# Patient Record
Sex: Female | Born: 1985 | Race: Black or African American | Hispanic: No | Marital: Single | Smoking: Former smoker
Health system: Southern US, Community
[De-identification: ages and names within clinical notes are randomized; demographics above are authoritative.]

## PROBLEM LIST (undated history)

## (undated) ENCOUNTER — Inpatient Hospital Stay (HOSPITAL_COMMUNITY): Payer: Self-pay

## (undated) ENCOUNTER — Emergency Department (HOSPITAL_COMMUNITY): Discharge status: Absent without leave | Payer: Managed Care, Other (non HMO)

## (undated) DIAGNOSIS — R51 Headache: Secondary | ICD-10-CM

## (undated) DIAGNOSIS — I10 Essential (primary) hypertension: Secondary | ICD-10-CM

## (undated) DIAGNOSIS — J45909 Unspecified asthma, uncomplicated: Secondary | ICD-10-CM

## (undated) DIAGNOSIS — R87619 Unspecified abnormal cytological findings in specimens from cervix uteri: Secondary | ICD-10-CM

## (undated) DIAGNOSIS — F4310 Post-traumatic stress disorder, unspecified: Secondary | ICD-10-CM

## (undated) DIAGNOSIS — IMO0002 Reserved for concepts with insufficient information to code with codable children: Secondary | ICD-10-CM

## (undated) DIAGNOSIS — F32A Depression, unspecified: Secondary | ICD-10-CM

## (undated) DIAGNOSIS — M545 Low back pain, unspecified: Secondary | ICD-10-CM

## (undated) DIAGNOSIS — M79606 Pain in leg, unspecified: Secondary | ICD-10-CM

## (undated) DIAGNOSIS — N83209 Unspecified ovarian cyst, unspecified side: Secondary | ICD-10-CM

## (undated) DIAGNOSIS — A749 Chlamydial infection, unspecified: Secondary | ICD-10-CM

## (undated) DIAGNOSIS — D649 Anemia, unspecified: Secondary | ICD-10-CM

## (undated) HISTORY — DX: Low back pain, unspecified: M54.50

## (undated) HISTORY — DX: Depression, unspecified: F32.A

## (undated) HISTORY — DX: Low back pain: M54.5

## (undated) HISTORY — PX: TUBAL LIGATION: SHX77

## (undated) HISTORY — DX: Pain in leg, unspecified: M79.606

## (undated) HISTORY — DX: Post-traumatic stress disorder, unspecified: F43.10

---
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
## 2004-08-29 ENCOUNTER — Inpatient Hospital Stay (HOSPITAL_COMMUNITY): Admission: AD | Admit: 2004-08-29 | Discharge: 2004-08-29 | Payer: Self-pay | Admitting: *Deleted

## 2004-09-10 ENCOUNTER — Other Ambulatory Visit: Admission: RE | Admit: 2004-09-10 | Discharge: 2004-09-10 | Payer: Self-pay | Admitting: Obstetrics and Gynecology

## 2005-01-23 ENCOUNTER — Inpatient Hospital Stay (HOSPITAL_COMMUNITY): Admission: AD | Admit: 2005-01-23 | Discharge: 2005-01-25 | Payer: Self-pay | Admitting: Obstetrics and Gynecology

## 2005-02-07 ENCOUNTER — Inpatient Hospital Stay (HOSPITAL_COMMUNITY): Admission: AD | Admit: 2005-02-07 | Discharge: 2005-02-07 | Payer: Self-pay | Admitting: Obstetrics and Gynecology

## 2005-02-21 HISTORY — PX: OTHER SURGICAL HISTORY: SHX169

## 2005-02-21 HISTORY — PX: DIAGNOSTIC LAPAROSCOPY: SUR761

## 2005-04-06 ENCOUNTER — Inpatient Hospital Stay (HOSPITAL_COMMUNITY): Admission: AD | Admit: 2005-04-06 | Discharge: 2005-04-10 | Payer: Self-pay | Admitting: Obstetrics and Gynecology

## 2005-04-07 ENCOUNTER — Encounter (INDEPENDENT_AMBULATORY_CARE_PROVIDER_SITE_OTHER): Payer: Self-pay | Admitting: Specialist

## 2005-05-20 ENCOUNTER — Other Ambulatory Visit: Admission: RE | Admit: 2005-05-20 | Discharge: 2005-05-20 | Payer: Self-pay | Admitting: Obstetrics and Gynecology

## 2005-07-01 ENCOUNTER — Observation Stay (HOSPITAL_COMMUNITY): Admission: AD | Admit: 2005-07-01 | Discharge: 2005-07-02 | Payer: Self-pay | Admitting: Obstetrics and Gynecology

## 2005-07-01 ENCOUNTER — Encounter (INDEPENDENT_AMBULATORY_CARE_PROVIDER_SITE_OTHER): Payer: Self-pay | Admitting: Specialist

## 2006-05-31 ENCOUNTER — Other Ambulatory Visit: Admission: RE | Admit: 2006-05-31 | Discharge: 2006-05-31 | Payer: Self-pay | Admitting: Obstetrics and Gynecology

## 2006-08-18 ENCOUNTER — Emergency Department (HOSPITAL_COMMUNITY): Admission: EM | Admit: 2006-08-18 | Discharge: 2006-08-18 | Payer: Self-pay | Admitting: Emergency Medicine

## 2006-08-19 ENCOUNTER — Emergency Department (HOSPITAL_COMMUNITY): Admission: EM | Admit: 2006-08-19 | Discharge: 2006-08-19 | Payer: Self-pay | Admitting: Emergency Medicine

## 2007-02-17 ENCOUNTER — Emergency Department (HOSPITAL_COMMUNITY): Admission: EM | Admit: 2007-02-17 | Discharge: 2007-02-17 | Payer: Self-pay | Admitting: Family Medicine

## 2007-05-21 ENCOUNTER — Emergency Department (HOSPITAL_COMMUNITY): Admission: EM | Admit: 2007-05-21 | Discharge: 2007-05-22 | Payer: Self-pay | Admitting: Emergency Medicine

## 2007-05-26 ENCOUNTER — Inpatient Hospital Stay (HOSPITAL_COMMUNITY): Admission: AD | Admit: 2007-05-26 | Discharge: 2007-05-26 | Payer: Self-pay | Admitting: Obstetrics and Gynecology

## 2007-06-10 ENCOUNTER — Inpatient Hospital Stay (HOSPITAL_COMMUNITY): Admission: AD | Admit: 2007-06-10 | Discharge: 2007-06-10 | Payer: Self-pay | Admitting: Obstetrics and Gynecology

## 2007-06-17 ENCOUNTER — Inpatient Hospital Stay (HOSPITAL_COMMUNITY): Admission: AD | Admit: 2007-06-17 | Discharge: 2007-06-17 | Payer: Self-pay | Admitting: Obstetrics and Gynecology

## 2007-07-20 ENCOUNTER — Emergency Department (HOSPITAL_COMMUNITY): Admission: EM | Admit: 2007-07-20 | Discharge: 2007-07-21 | Payer: Self-pay | Admitting: Emergency Medicine

## 2007-10-16 ENCOUNTER — Inpatient Hospital Stay (HOSPITAL_COMMUNITY): Admission: AD | Admit: 2007-10-16 | Discharge: 2007-10-16 | Payer: Self-pay | Admitting: Obstetrics and Gynecology

## 2008-01-24 ENCOUNTER — Inpatient Hospital Stay (HOSPITAL_COMMUNITY): Admission: AD | Admit: 2008-01-24 | Discharge: 2008-01-27 | Payer: Self-pay | Admitting: Obstetrics and Gynecology

## 2008-01-24 ENCOUNTER — Encounter (INDEPENDENT_AMBULATORY_CARE_PROVIDER_SITE_OTHER): Payer: Self-pay | Admitting: Obstetrics and Gynecology

## 2008-05-09 ENCOUNTER — Emergency Department (HOSPITAL_COMMUNITY): Admission: EM | Admit: 2008-05-09 | Discharge: 2008-05-09 | Payer: Self-pay | Admitting: Family Medicine

## 2008-07-23 ENCOUNTER — Emergency Department (HOSPITAL_COMMUNITY): Admission: EM | Admit: 2008-07-23 | Discharge: 2008-07-23 | Payer: Self-pay | Admitting: Emergency Medicine

## 2008-08-29 ENCOUNTER — Emergency Department (HOSPITAL_COMMUNITY): Admission: EM | Admit: 2008-08-29 | Discharge: 2008-08-29 | Payer: Self-pay | Admitting: Emergency Medicine

## 2008-11-16 ENCOUNTER — Emergency Department (HOSPITAL_COMMUNITY): Admission: EM | Admit: 2008-11-16 | Discharge: 2008-11-16 | Payer: Self-pay | Admitting: Emergency Medicine

## 2009-10-12 ENCOUNTER — Inpatient Hospital Stay (HOSPITAL_COMMUNITY): Admission: AD | Admit: 2009-10-12 | Discharge: 2009-10-12 | Payer: Self-pay | Admitting: Obstetrics & Gynecology

## 2009-10-12 ENCOUNTER — Ambulatory Visit: Payer: Self-pay | Admitting: Nurse Practitioner

## 2010-01-19 ENCOUNTER — Emergency Department (HOSPITAL_COMMUNITY)
Admission: EM | Admit: 2010-01-19 | Discharge: 2010-01-19 | Payer: Self-pay | Source: Home / Self Care | Admitting: Emergency Medicine

## 2010-02-02 ENCOUNTER — Emergency Department (HOSPITAL_COMMUNITY)
Admission: EM | Admit: 2010-02-02 | Discharge: 2010-02-02 | Payer: Self-pay | Source: Home / Self Care | Admitting: Emergency Medicine

## 2010-04-26 ENCOUNTER — Inpatient Hospital Stay (HOSPITAL_COMMUNITY)
Admission: AD | Admit: 2010-04-26 | Discharge: 2010-04-26 | Disposition: A | Discharge status: Home / Self Care | Payer: Self-pay | Source: Ambulatory Visit | Attending: Obstetrics & Gynecology | Admitting: Obstetrics & Gynecology

## 2010-04-26 DIAGNOSIS — N760 Acute vaginitis: Secondary | ICD-10-CM

## 2010-04-26 DIAGNOSIS — R109 Unspecified abdominal pain: Secondary | ICD-10-CM | POA: Insufficient documentation

## 2010-04-26 DIAGNOSIS — B9689 Other specified bacterial agents as the cause of diseases classified elsewhere: Secondary | ICD-10-CM | POA: Insufficient documentation

## 2010-04-26 DIAGNOSIS — A499 Bacterial infection, unspecified: Secondary | ICD-10-CM

## 2010-04-26 LAB — POCT PREGNANCY, URINE: Preg Test, Ur: NEGATIVE

## 2010-04-26 LAB — URINALYSIS, ROUTINE W REFLEX MICROSCOPIC
Bilirubin Urine: NEGATIVE
Leukocytes, UA: NEGATIVE
Nitrite: NEGATIVE
Specific Gravity, Urine: 1.025 (ref 1.005–1.030)
pH: 6.5 (ref 5.0–8.0)

## 2010-04-26 LAB — CBC
HCT: 36.9 % (ref 36.0–46.0)
MCH: 26.2 pg (ref 26.0–34.0)
MCHC: 32 g/dL (ref 30.0–36.0)
MCV: 82 fL (ref 78.0–100.0)
RDW: 14.5 % (ref 11.5–15.5)

## 2010-04-26 LAB — WET PREP, GENITAL: Yeast Wet Prep HPF POC: NONE SEEN

## 2010-04-26 LAB — URINE MICROSCOPIC-ADD ON

## 2010-05-04 LAB — WET PREP, GENITAL
Clue Cells Wet Prep HPF POC: NONE SEEN
Trich, Wet Prep: NONE SEEN

## 2010-05-04 LAB — URINE MICROSCOPIC-ADD ON

## 2010-05-04 LAB — RPR: RPR Ser Ql: NONREACTIVE

## 2010-05-04 LAB — URINALYSIS, ROUTINE W REFLEX MICROSCOPIC
Bilirubin Urine: NEGATIVE
Glucose, UA: NEGATIVE mg/dL
Glucose, UA: NEGATIVE mg/dL
Ketones, ur: NEGATIVE mg/dL
Protein, ur: 100 mg/dL — AB
pH: 6 (ref 5.0–8.0)
pH: 6.5 (ref 5.0–8.0)

## 2010-05-04 LAB — URINE CULTURE: Culture  Setup Time: 201112131116

## 2010-05-06 LAB — POCT PREGNANCY, URINE: Preg Test, Ur: NEGATIVE

## 2010-05-06 LAB — URINALYSIS, ROUTINE W REFLEX MICROSCOPIC
Bilirubin Urine: NEGATIVE
Ketones, ur: NEGATIVE mg/dL
Protein, ur: NEGATIVE mg/dL
Urobilinogen, UA: 0.2 mg/dL (ref 0.0–1.0)

## 2010-05-06 LAB — WET PREP, GENITAL
Trich, Wet Prep: NONE SEEN
Yeast Wet Prep HPF POC: NONE SEEN

## 2010-05-06 LAB — URINE MICROSCOPIC-ADD ON

## 2010-05-27 ENCOUNTER — Encounter: Payer: Self-pay | Admitting: Obstetrics & Gynecology

## 2010-05-31 LAB — RPR: RPR Ser Ql: NONREACTIVE

## 2010-05-31 LAB — URINE MICROSCOPIC-ADD ON

## 2010-05-31 LAB — URINALYSIS, ROUTINE W REFLEX MICROSCOPIC
Bilirubin Urine: NEGATIVE
Nitrite: NEGATIVE
Specific Gravity, Urine: 1.027 (ref 1.005–1.030)
pH: 6 (ref 5.0–8.0)

## 2010-05-31 LAB — PREGNANCY, URINE: Preg Test, Ur: NEGATIVE

## 2010-05-31 LAB — GC/CHLAMYDIA PROBE AMP, GENITAL
Chlamydia, DNA Probe: NEGATIVE
GC Probe Amp, Genital: NEGATIVE

## 2010-05-31 LAB — WET PREP, GENITAL: Yeast Wet Prep HPF POC: NONE SEEN

## 2010-07-06 NOTE — Op Note (Signed)
[name], [name]             ACCOUNT NO.:  [id number]   MEDICAL RECORD NO.:  [id number]          PATIENT TYPE:  INP   LOCATION:  9133                          [facility]   PHYSICIAN:  Hal Morales, M.D.DATE OF BIRTH:  1986/01/30   DATE OF PROCEDURE:  01/24/2008  DATE OF DISCHARGE:                               OPERATIVE REPORT   PREOPERATIVE DIAGNOSES:  1. Intrauterine pregnancy at 94 weeks' gestation.  2. Prior cesarean section.  3. Failed trial of labor.  4. Nonreassuring fetal heart rate tracing.   POSTOPERATIVE DIAGNOSES:  1. Intrauterine pregnancy at 25 weeks' gestation.  2. Prior cesarean section.  3. Failed trial of labor.  4. Nonreassuring fetal heart rate tracing.   OPERATION:  Repeat low transverse cesarean section.   SURGEON:  Hal Morales, MD   FIRST ASSISTANT:  Elby Showers. Mayford Knife, CNM   ANESTHESIA:  Epidural.   ESTIMATED BLOOD LOSS:  750 mL.   COMPLICATIONS:  None.   FINDINGS:  The patient delivered a female infant weighing 7 pounds 10  ounces with Apgars of 5 and 9 at 1 and 5 minutes respectively.  The cord  pH was 7.3.  The placenta contained an eccentrically inserted 3-vessel  cord.  The amniotic fluid was port wine colored upon entry into the  uterus.   PROCEDURE:  The patient was taken to the operating room after  appropriate identification and after a discussion of the indications for  her recommended procedure, and the risks of anesthesia, bleeding,  infection, and damage to adjacent organs.  Her labor epidural and Foley  catheter were in place with blood tinging of the urine.  The labor  epidural was dosed for surgical anesthesia, and the patient was placed  on the operating table in the supine position with a left lateral tilt.  The abdomen was prepped as a sterile field with multiple layers and  draped as a sterile field.  The site of the previous cesarean section  incision was infiltrated with 30 mL of 0.25% Marcaine.  After  assurance  of adequate surgical anesthesia, a suprapubic incision was made at this  site, and the abdomen opened in layers.  The peritoneum was entered and  the bladder blade placed.  The uterus was incised approximately 2 cm  above the uterovesical fold, and that incision taken laterally on either  side.  The infant was delivered from the occiput transverse position and  after having the nares and pharynx suctioned and the cord clamped and  cut, was handed off to the awaiting pediatricians.  The placenta was  allowed to spontaneously separate from the uterus and was removed from  the operative field.  It was given to the representative of [facility]  cord blood banking for cord blood donation.  The uterine incision was  closed with a running interlocking suture noting that the lower uterine  segment was adherent to the bladder and markedly thin.  This required  teasing the bladder off the anterior portion of the lower uterine  segment, which allowed closure of the first layer.  A second layer was  placed  from the midline to the right apex of the incision with adequate  hemostasis.  Copious irrigation was carried out.  The tubes and ovaries  were normal bilaterally.  The abdominal peritoneum was then closed in a  running fashion with 2-0 Vicryl.  The rectus fascia was closed with a  running suture of 0 Vicryl, then reinforced on either side of midline  with figure-of-8 suture of 0 Vicryl.  The subcutaneous tissue was made  hemostatic with Bovie cautery and irrigated.  A subcuticular suture of 3-  0 Monocryl was placed to approximate the skin incision.  Steri-Strips  were applied and a sterile dressing applied.  The patient was taken from  the operating room to the recovery room in satisfactory condition having  tolerated the procedure well with sponge and instrument counts correct.   SPECIMENS TO PATHOLOGY:  Placenta.   The infant went to the full-term nursery.      Hal Morales, M.D.  Electronically Signed     VPH/MEDQ  D:  01/24/2008  T:  01/25/2008  Job:  [id number]

## 2010-07-06 NOTE — Discharge Summary (Signed)
NAMEPAYDEN, [name]             ACCOUNT NO.:  [id number]   MEDICAL RECORD NO.:  [id number]          PATIENT TYPE:  INP   LOCATION:  9133                          [facility]   PHYSICIAN:  Crist Fat. Rivard, M.D. DATE OF BIRTH:  10-22-1985   DATE OF ADMISSION:  01/24/2008  DATE OF DISCHARGE:  01/27/2008                               DISCHARGE SUMMARY   ADMITTING PHYSICIAN:  Dois Davenport A. Rivard, MD   DISCHARGING PHYSICIAN:  Osborn Coho, MD   ADMISSION DIAGNOSES:  1. Intrauterine pregnancy at 36 and 2/7th weeks.  2. Early active labor.  3. Trial of labor with previous cesarean section.   DISCHARGE DIAGNOSES:  1. Intrauterine pregnancy at 50 and 2/7th weeks.  2. Early active labor..  3. Trial of labor with previous cesarean section.  4. Failed trial of labor and nonreassuring fetal heart rate tracing.  5. Status post cesarean delivery of a female infant weighing 7 pounds      10 ounces with Apgars of 5 and 9.   HOSPITAL PROCEDURES:  1. Electronic fetal monitoring.  2. Epidural anesthesia.  3. Amniotomy.  4. Internal fetal monitors.  5. Amnioinfusion.  6. Repeat low-transverse cesarean section.   HOSPITAL COURSE:  The patient was admitted in early active labor with  cervix of 4 cm.  She got an epidural for pain and progressed to 4-5 cm.  Low-dose Pitocin was started and amniotomy was performed.  She  progressed to 5 cm later in the morning and then 7 cm by the afternoon.  She had a prolonged fetal heart rate deceleration in the evening of  January 24, 2008 with usual measures employed for resolution.  She  progressed to 8-9 cm, but began to have swelling in her cervix.  About  an hour later after that, her cervix remained at 8 cm with swelling.  She had borderline contractions per IUPC, and fetal heart rate developed  recurrent variable decelerations whenever she had strong contractions,  and Pitocin was unable to be restarted.  C-section was recommended and  accepted,  and she had a delivery of a female infant weighing 7 pounds 10  ounces, Apgars 5 and 9.  Cord pH was 7.3, amniotic fluid was port-wine  colored upon entry into the uterus.  Postop day #1, she did well, did  not have any dizziness, urinary output was sufficient, and her  hemoglobin was 8.9.  She was started on iron.  On postop day #2, she  continued to improve receiving routine care, Motrin and Percocet were  controlling her pain well.  On postop day #3, she was ready to go home.  Chest was clear.  Heart rate regular rate and rhythm.  Abdomen was soft  and appropriately tender.  Incision was clean, dry and intact.  She was  tolerating food and fluids well and voiding sufficiently.  She had some  mild peripheral edema, but no other abnormalities.  Blood pressure was  normal at 112/64.  She was deemed to have receive full benefit of her  hospital stay and was discharged home.   DISCHARGE MEDICATIONS:  1. Motrin 600  mg p.o. q.6 h. p.r.n.  2. Percocet 1-2 p.o. q.4 h. p.r.n.   DISCHARGE LABORATORY DATA:  White blood cell count 20.8, hemoglobin 8.9,  and platelets 145.  RPR nonreactive.   DISCHARGE INSTRUCTIONS:  Per [facility] handout.  Discharge followup in 6 weeks  or p.r.n.      Marie L. Williams, C.N.M.      Crist Fat Rivard, M.D.  Electronically Signed    MLW/MEDQ  D:  01/27/2008  T:  01/27/2008  Job:  [id number]

## 2010-07-06 NOTE — H&P (Signed)
NAMEFONTELLA, [name]             ACCOUNT NO.:  [id number]   MEDICAL RECORD NO.:  [id number]          PATIENT TYPE:  INP   LOCATION:  9162                          [facility]   PHYSICIAN:  Crist Fat. Rivard, M.D. DATE OF BIRTH:  13-Jan-1986   DATE OF ADMISSION:  01/24/2008  DATE OF DISCHARGE:                              HISTORY & PHYSICAL   This is a 25 year old gravida 3, para 1-1-0-2 at 39-2/7th weeks, who  presents with contractions.  She denies leaking or bleeding or reports  positive fetal movement.  Pregnancy has been followed by the Nurse-  Midwife Service and remarkable for;  1. Previous C-section.  2. History of dermoid cyst.  3. First-trimester spotting.  4. Group B strep negative.   ALLERGIES:  None.   OB HISTORY:  Remarkable for vaginal delivery in 2005 of a female infant  at 85 weeks' gestation weighing 6 pounds 13 ounces.  She had a C-section  in 2007 of a female infant at 38 weeks' gestation weighing 7 pounds 1  ounce, remarkable for fetal distress.   MEDICAL HISTORY:  Remarkable for history of chlamydia in 2005, history  of dermoid cyst in 2007, and childhood varicella.   SURGICAL HISTORY:  Remarkable for a C-section in 2007.   FAMILY HISTORY:  Remarkable for grandparents with hypertension and  grandmother with GB.   GENETIC HISTORY:  Remarkable for an uncle with mental retardation.   SOCIAL HISTORY:  The patient is single.  Father of the baby, Thomas Hoff, is involved and supportive.  She does not report a religious  affiliation.  She denies any alcohol, tobacco, or drug use.   PRENATAL LABS:  Hemoglobin 12.4, platelets 256, blood type O positive,  antibody screen negative, sickle cell negative, RPR nonreactive, rubella  immune, hepatitis negative, HIV negative, Pap test normal, gonorrhea  negative, chlamydia negative, and cystic fibrosis negative.   HISTORY OF CURRENT PREGNANCY:  The patient entered care at 8 weeks'  gestation.  She had an  ultrasound at 9 weeks showing an anterior fibroid  at 2 cm.  First-trimester screen was normal.  Anatomy ultrasound was  normal.  She signed tubal ligation papers at 23 weeks.  She had group B  strep negative at term.   OBJECTIVE DATA:  VITAL SIGNS:  Stable, afebrile.  HEENT:  Within normal limits.  THYROID:  Normal, not enlarged.  CHEST:  Clear to auscultation.  HEART:  Regular rate and rhythm.  ABDOMEN:  Gravid.  Vertex Leopold.  EFM shows reactive fetal heart rate  with contractions every 5 minutes.  EFW is about 6-1/2 to 7 pounds.  Cervix is 4 cm, 80% effaced, -2 station with a vertex presentation.  EXTREMITIES:  Within normal limits.   ASSESSMENT:  1. Intrauterine pregnancy at 39-2/7th weeks.  2. Early active labor.  3. Trial of labor with previous cesarean section.   PLAN:  1. Admit per Dr. Estanislado Pandy.  2. Routine CNM orders.      Marie L. Williams, C.N.M.      Crist Fat Rivard, M.D.  Electronically Signed    MLW/MEDQ  D:  01/24/2008  T:  01/24/2008  Job:  [id number]

## 2010-07-09 NOTE — Discharge Summary (Signed)
[name], [name]            ACCOUNT NO.:  [id number]   MEDICAL RECORD NO.:  [id number]          PATIENT TYPE:  INP   LOCATION:  9158                          [facility]   PHYSICIAN:  James A. Ashley Royalty, M.D.DATE OF BIRTH:  April 21, 1985   DATE OF ADMISSION:  01/23/2005  DATE OF DISCHARGE:  01/25/2005                                 DISCHARGE SUMMARY   DISCHARGE DIAGNOSES:  1.  Intrauterine pregnancy at 26 weeks, undelivered.  2.  Preterm labor, lysed.   OPERATIONS AND SPECIAL PROCEDURES:  None.   DISCHARGE MEDICATIONS:  Vitamins.   HISTORY AND PHYSICAL:  A 25 year old gravida 2, para 1-0-0-1 admitted per  Dr. Richardson Dopp for preterm labor at or about [redacted] weeks gestation.  For the  remainder of the history and physical, please see chart.   HOSPITAL COURSE:  The patient was admitted to [facility].  Admission laboratory studies were drawn.  On January 24, 2005,  the patient was evaluated and found to have the contractions lysed.  Fetal  fibronectin was negative.  Hence, she was discontinued from magnesium  sulfate.  On January 25, 2005, she was felt to be a candidate for discharge,  and was discharged home afebrile and in satisfactory condition.   OBSTETRICAL CLINICAL FINDINGS:  GC/chlamydia culture was negative.  Group B  strep positive.   DISPOSITION:  The patient is to return to the office in approximately two  days for followup.      James A. Ashley Royalty, M.D.  Electronically Signed     JAM/MEDQ  D:  03/10/2005  T:  03/10/2005  Job:  [id number]

## 2010-07-09 NOTE — H&P (Signed)
[name], Yolanda NO.:  [id number]   MEDICAL RECORD NO.:  [id number]                   PATIENT TYPE:  MAT   LOCATION:  MATC                                 [facility]   PHYSICIAN:  Hal Morales, M.D.             DATE OF BIRTH:  04-Mar-1985   DATE OF ADMISSION:  10/14/2003  DATE OF DISCHARGE:                                HISTORY & PHYSICAL   [name] is an 25 year old gravida 1, para 0 at [redacted] weeks gestation, EDD  October 15, 2003, as determined by dates and confirmed with pregnancy  ultrasound.  This patient was evaluated at the office of [facility] this a.m. with  contractions every two minutes all night long which has become much more  intense.  She reports positive fetal movement, no bleeding, no rupture of  membranes.  Her cervix in the office was 4, 95%, vertex -1.  She was with a  bulging bag of water.  She was therefore admitted to [facility] in labor.  She denies any PIH symptoms, no headache, visual  changes or epigastric pain.  Her pregnancy has been followed by the C.N.M.  service at [facility] and is remarkable for:  1. Slightly late care.  She began care at [redacted] weeks gestation.  2. Mild cognitive impairment.  3. History of migraines.  4. Positive chlamydia culture in February of 2005.  5. Urinary tract infection in the first trimester.   Her group B strep culture is negative.  This patient began prenatal care on  April 16, 2003, at [redacted] weeks gestation.  EDC determined by dates and  confirmed with ultrasound.  At her first prenatal visit, it was found that  the patient had a urinary tract infection and positive chlamydia.  She was  treated for that on May 15, 2003, and given a prescription for [facility]  for seven days for the urinary tract infection.  Test of cure for [facility] and  chlamydia were negative on May 23, 2003.  Her urine culture was negative  following her treatment with Macrobid.  Otherwise her pregnancy  has been  unremarkable.  She has been size less than dates throughout her pregnancy,  normotensive with no proteinuria.  An ultrasound examination has remained  with appropriate growth.  She has had an EIF which has remained throughout  the pregnancy and is unchanged with her last ultrasound.   PRENATAL LABORATORY DATA:  On April 16, 2003, hemoglobin and hematocrit  11.1 and 14.5, platelets 310,000.  Blood type and Rh O positive.  Antibody  screen negative.  VDRL nonreactive.  Rubella immune.  Hepatitis B surface  antigen negative.  Sickle cell trait negative.  HIV declined.  Pap smear  within normal limits.  Positive chlamydia at her new OB visit.  Negative  chlamydia on May 23, 2003.  CF testing was negative.  Quad screen within  normal limits.  At 35  weeks one hour glucose challenge within normal limits.  At 36 weeks, culture of the vaginal tract is negative for GBS, GC and  chlamydia.   ALLERGIES:  No known drug allergies.   PAST MEDICAL HISTORY:  The patient had a colposcopy in 2002 for an abnormal  Pap smear and had chlamydia in 2003 and also in the first trimester of this  pregnancy.  The patient is a cigarette smoker and she smoked approximately  half a pack of cigarettes per day prior to pregnancy.  Since then, the  patient states that she stopped smoking with positive urine pregnancy test.  She does have a history of migraines for which she uses ibuprofen  effectively.   FAMILY HISTORY:  The patient's mother, father, maternal grandmother,  maternal grandfather paternal grandmother and paternal grandfather with a  history of chronic hypertension.  Paternal aunt and cousins with diabetes.  Patient's mother, father, maternal grandfather and paternal grandmother all  also have migraine headaches.  Her maternal grandmother had cancer of an  unknown type and had a hysterectomy.  The patient's mother is a tobacco  smoker and mother and father use alcohol and tobacco.    GENETIC HISTORY:  There is no genetic history of familial chromosomal  disorders, children that died in infancy or that were born with birth  defects.   SOCIAL HISTORY:  [name] is an 25 year old African American female.  Father of the baby is not involved with her pregnancy at all.  She does not  subscribe to religious faith.  She denies the use of alcohol or illicit  drugs.   REVIEW OF SYMPTOMS:  There are no signs or symptoms suggestive of focal or  systemic disease and the patient is typical of one with a uterine pregnancy  at term in active labor.   PLAN:  Admit per Dr. Hal Morales.  Routine C.N.M. orders.  The patient  may have epidural as desired.  Discussed plan with the patient as well as  expectant management for labor at this time.  She may have an epidural for  pain control.     [name], C.N.M.               Hal Morales, M.D.    SDM/MEDQ  D:  10/15/2003  T:  10/15/2003  Job:  [id number]

## 2010-07-09 NOTE — Discharge Summary (Signed)
[name], [name]            ACCOUNT NO.:  [id number]   MEDICAL RECORD NO.:  [id number]          PATIENT TYPE:  OBV   LOCATION:  9302                          [facility]   PHYSICIAN:  James A. Ashley Royalty, M.D.DATE OF BIRTH:  03-18-1985   DATE OF ADMISSION:  07/01/2005  DATE OF DISCHARGE:  07/02/2005                                 DISCHARGE SUMMARY   DISCHARGE DIAGNOSIS:  Right ovarian cystic teratoma - pending.   OPERATIONS AND SPECIAL PROCEDURES:  Diagnostic/operative laparoscopy with  right oophorectomy.   CONSULTATIONS:  None.   DISCHARGE MEDICATIONS:  Percocet.   HISTORY AND PHYSICAL:  This is a 25 year old gravida 2, para 2 with a right  adnexal cyst, felt to represent a cystic teratoma.  The patient was  evaluated on June 15, 2005 in the office, at which times she said she had  continuous right lower quadrant discomfort, as well.  Ultrasound on May 26, 2005 revealed a right adnexal mass which was noted to be persistent of 4.7  cm in greatest diameter.  The appearance was consistent with a dermoid cyst.  For the remainder of the history and physical, please see chart.   HOSPITAL COURSE:  The patient was taken to the operating room on June 01, 2005 and underwent diagnostic/operative laparoscopy with right oophorectomy.  The decision was made after the procedure to keep her on outpatient  observation, and, on Jul 02, 2005, she was felt to be stable for discharge  and was discharged home afebrile and in satisfactory condition.   Pathology returned showing benign ovary with a mature cystic teratoma.   DISPOSITION:  The patient is asked to return to [facility]  Obst[facility]s in approximately 2 weeks for her postoperative appointment.      James A. Ashley Royalty, M.D.  Electronically Signed     JAM/MEDQ  D:  08/10/2005  T:  08/11/2005  Job:  [id number]

## 2010-07-09 NOTE — Discharge Summary (Signed)
[name], [name]            ACCOUNT NO.:  [id number]   MEDICAL RECORD NO.:  [id number]          PATIENT TYPE:  INP   LOCATION:  9148                          [facility]   PHYSICIAN:  James A. Ashley Royalty, M.D.DATE OF BIRTH:  [birth year]   DATE OF ADMISSION:  04/06/2005  DATE OF DISCHARGE:  04/10/2005                                 DISCHARGE SUMMARY   DISCHARGE DIAGNOSES:  1.  Intrauterine pregnancy at 36 weeks 5 days, delivered.  2.  Preterm labor.  3.  Nonreassuring fetal heart tracing.  4.  Anemia.   OPERATIONS AND SPECIAL PROCEDURES:  Primary low transverse cesarean section.   CONSULTATIONS:  None.   DISCHARGE MEDICATIONS:  Percocet, Motrin, Chromagen.   HISTORY OF PRESENT ILLNESS:  This is a 25 year old gravida 2, para 1 at 36  weeks 5 days gestation.  Prenatal care was complicated by sickle cell trait  in the father of the baby.  The patient herself had a normal hemoglobin  electrophoresis.  There was also some history of cervical funneling.  The  patient was seen in the office on the day of admission and was noted to be 4  cm dilated.  The patient presented to the hospital at 7:15 p.m. complaining  of contractions. Cervix at that time was 5 cm dilated.  For the remainder of  the history and physical, please see chart.   HOSPITAL COURSE:  The patient was admitted to [facility]  [place].  Admission laboratory studies were drawn.  She went on to labor.  I was called on April 07, 2005 regarding aberrations of the fetal heart  rate.  IUPC was placed and the fetal heart rate was monitored carefully.  Pitocin was begun judiciously.  Around 2:15 a.m. on April 07, 2005, the  patient was noted to have fetal heart rate decelerations to the 70-beat-per-  minute range.  This was felt to be a nonreassuring fetal heart tracing and  the patient was taken to the operating theater for a primary low transverse  cesarean section.  The procedure was performed by Dr.  Sylvester Harder and  yielded a 7-pound 1-ounce female, Apgars 9 at one minute and 10 at minutes,  sent to the newborn nursery.  Arterial cord pH was 7.32.  The patient's  postoperative course was complicated only by a modest anemia which was  asymptomatic.  She was discharged on April 10, 2005, afebrile and in  satisfactory condition.   DISPOSITION:  The patient will return to Dr. Ashley Royalty' office in 3-4 weeks  for postoperative appointment.      James A. Ashley Royalty, M.D.  Electronically Signed     JAM/MEDQ  D:  05/25/2005  T:  05/26/2005  Job:  [id number]

## 2010-07-09 NOTE — Op Note (Signed)
[name], [name]            ACCOUNT NO.:  [id number]   MEDICAL RECORD NO.:  [id number]          PATIENT TYPE:  OBV   LOCATION:  9302                          FACILITY:  [facility]   PHYSICIAN:  James A. Ashley Royalty, M.D.DATE OF BIRTH:  [birth year]   DATE OF PROCEDURE:  07/01/2005  DATE OF DISCHARGE:  07/02/2005                                 OPERATIVE REPORT   PREOPERATIVE DIAGNOSIS:  Right adnexal mass.   POSTOPERATIVE DIAGNOSIS:  Right ovarian cyst.  Pathology pending.   PROCEDURE:  1.  Diagnostic laparoscopy.  2.  Right oophorectomy.   SURGEON:  Rudy Jew. Ashley Royalty, MD   ANESTHESIA:  General.   ESTIMATED BLOOD LOSS:  50 mL.   COMPLICATIONS:  None.   PACKS AND DRAINS:  None.   PROCEDURE:  The patient was taken to the operating room and placed in the  dorsal supine position.  Preoperative general anesthetic was administered.  She was placed in the lithotomy position and prepped and draped in the usual  manner for abdominal and vaginal surgery.  Posterior weighted retractor was  placed per vagina.  The anterior lip of the cervix was grasped with a single-  tooth tenaculum.  Jarcho uterine manipulator was placed per cervix and held  in place with a tenaculum.  The bladder was drained with a red rubber  catheter.   A 1.5 cm infraumbilical incision was made in the longitudinal plane.  Veress  needle introduced into the abdominal cavity using instillation of saline and  hanging drop techniques.  Approximately 3 liters of CO2 were instilled at 1  liter per minute to create a pneumoperitoneum.  Next, a size 11 disposable  laparoscopic trocar was placed into the abdominal cavity.  Its location was  verified by placement of the laparoscope.  There was no evidence of any  trauma at that point.  Pneumoperitoneum was maintained throughout with CO2.  Next, two 5 mm suprapubic ports were placed in the left and right lower  quadrants, respectively.  Transillumination and direct  visualization  techniques were employed.  The pelvis was thoroughly surveyed.  The uterus  was normal size, shape, and contour without evidence of any fibroids or  endometriosis.  The left fallopian tube and right fallopian tube were normal  size, shape, contour, and length with luxuriant fimbriae.  The left ovary  was normal size, shape, and contour without evidence of any cysts or  endometriosis.  The right ovary was markedly enlarged to approximately 4-5  cm in greatest diameter and appeared to have cystic components to it as  well.  It appeared consistent with the previously-described dermoid cyst  from ultrasound reports.  Cytologic washings were obtained with the Nezhat  suction irrigator and sent to cytology for study.  Appropriate photos were  obtained.  An incision was made to remove the right ovary.  It was felt that  it was on enough of the pedicle to take it out separate and apart from the  right tube and leave the right tube in situ.  Endo loops consisting of PDS  suture were placed on the right ovarian pedicle.  The right ovary was then  excised with the tripolar cautery and placed in the cul-de-sac for later  retrieval.  The cyst remained intact throughout.  Next, the left inferior  port was converted to 10 mm, and the EndoCatch bag was placed in the  abdominal cavity for retrieval of the right ovary.  The right ovary was  brought out through and extended left inferior incision and submitted to  pathology for histologic studies.  The fascial defect was closed with 0  Vicryl in a running fashion.  Subcutaneous layer was closed with 3-0 chromic  in an interrupted fashion.  The skin was closed with 3-0 Monocryl in a  subcuticular fashion.  Next, the operative site was once again inspected  through the laparoscope after recreating the pneumoperitoneum.  Bipolar  cautery was used deep to the fascial layer at the level of the left inferior  port in order to ensure hemostasis.   Hemostasis was noted.  Copious  irrigation was accomplished.  The right adnexal pedicle was once again  inspected and found to be completely hemostatic.  The plane of dissection  was well away from the area of the ureter.   At this point, the patient was felt to have benefited maximally from the  surgical procedure.  The abdominal instruments were removed and  pneumoperitoneum evacuated.  Fascial defects were closed with 0 Vicryl in an  interrupted fashion.  The skin was closed with 3-0 Monocryl in a  subcuticular fashion.   The patient tolerated the procedure extremely well and after removal of the  vaginal instruments, returned to the recovery room in excellent condition.      James A. Ashley Royalty, M.D.  Electronically Signed     JAM/MEDQ  D:  07/01/2005  T:  07/03/2005  Job:  [id number]

## 2010-07-09 NOTE — Op Note (Signed)
[name], [name]            ACCOUNT NO.:  [id number]   MEDICAL RECORD NO.:  [id number]          PATIENT TYPE:  INP   LOCATION:  9148                          FACILITY:  [facility]   PHYSICIAN:  James A. Ashley Royalty, M.D.DATE OF BIRTH:  14-Oct-1985   DATE OF PROCEDURE:  04/07/2005  DATE OF DISCHARGE:                                 OPERATIVE REPORT   PREOPERATIVE DIAGNOSIS:  1.  Intrauterine pregnancy at 36 weeks 5 days gestation.  2.  Preterm labor.  3.  Nonreassuring fetal heart tracing.   POSTOPERATIVE DIAGNOSIS:  1.  Intrauterine pregnancy at 36 weeks 5 days gestation.  2.  Preterm labor.  3.  Nonreassuring fetal heart tracing.  4.  Pathology pending.   PROCEDURE:  Primary low transverse cesarean section.   SURGEON:  Rudy Jew. Ashley Royalty, M.D.   ANESTHESIA:  Epidural   FINDINGS:  7 pounds 1 ounce female Apgars 9 at 1 minutes and 10 at 5 minutes  sent to the newborn nursery, arterial cord pH 7.325.   ESTIMATED BLOOD LOSS:  800 mL.   COMPLICATIONS:  None.   PACKS AND DRAINS:  Foley.   Needle, sponge, and instrument counts correct x2.   PROCEDURE:  The patient was taken to the operating room and placed in the  dorsosupine position. Epidural was dosed to surgical levels. She was prepped  and draped in usual manner for abdominal surgery. Foley catheter had been  previously placed.   The Pfannenstiel incision was made down to the level of the fascia which was  nicked with a knife and incised transversely with Mayo scissors. The  underlying rectus muscles were separated from the fascia using sharp and  blunt dissection. Rectus muscles were separated in the midline exposing  peritoneum which was elevated with hemostats and entered atraumatically with  Metzenbaum scissors. The incision was extended longitudinally. The uterus  was identified. A bladder flap created by incising anterior uterine serosa  and sharply and bluntly dissecting the bladder inferiorly. It was held in  place  with the bladder blade. The uterus was then entered through a low  transverse incision using sharp and blunt dissection. The fluid was clear.  The infant was delivered from vertex presentation in an atraumatic manner.  The infant was suctioned. The umbilical cord was triply clamped, cut and the  infant given immediately to the awaiting pediatrics team. Arterial cord pH  was obtained from isolated segment. Then the regular cord blood was  obtained. The placenta and membranes were removed in their entirety and  submitted to pathology for histologic studies. The uterus was exteriorized.   The uterus was closed in two with running layers of #1 Vicryl. The first was  a running locking layer. The second was a running, intermittently locking,  and imbricating layer. Two additional figure-of-eight sutures were required  to obtain hemostasis. Hemostasis was noted. Uterus, tubes and ovaries were  found to be otherwise normal and returned to the abdominal cavity. Copious  irrigation was accomplished. Hemostasis was noted. The peritoneum was closed  3-0 Vicryl in running fashion. The fascia was closed with 0 Vicryl in  running  fashion. The skin was closed with staples.   The patient tolerated the procedure extremely well. At the conclusion of  procedure urine was clear and copious. She was returned to the recovery room  in excellent condition.      James A. Ashley Royalty, M.D.  Electronically Signed     JAM/MEDQ  D:  04/07/2005  T:  04/07/2005  Job:  [id number]

## 2010-11-15 LAB — URINALYSIS, ROUTINE W REFLEX MICROSCOPIC
Bilirubin Urine: NEGATIVE
Ketones, ur: 15 — AB
Nitrite: NEGATIVE
Protein, ur: NEGATIVE
Urobilinogen, UA: 1

## 2010-11-15 LAB — POCT PREGNANCY, URINE
Operator id: 277751
Preg Test, Ur: POSITIVE

## 2010-11-16 LAB — URINALYSIS, ROUTINE W REFLEX MICROSCOPIC
Glucose, UA: NEGATIVE
Nitrite: NEGATIVE
Protein, ur: 30 — AB
Protein, ur: NEGATIVE
Specific Gravity, Urine: 1.015
Specific Gravity, Urine: >1.03 — ABNORMAL HIGH
Urobilinogen, UA: 0.2
Urobilinogen, UA: 0.2

## 2010-11-16 LAB — URINE CULTURE

## 2010-11-16 LAB — SAMPLE TO BLOOD BANK

## 2010-11-16 LAB — URINE MICROSCOPIC-ADD ON

## 2010-11-16 LAB — HCG, QUANTITATIVE, PREGNANCY: hCG, Beta Chain, Quant, S: 3940 — ABNORMAL HIGH

## 2010-11-17 LAB — VARICELLA ZOSTER ANTIBODY, IGM: Varicella-Zoster Ab, IgM: <0.91 {index} (ref <0.91)

## 2010-11-17 LAB — VARICELLA ZOSTER ANTIBODY, IGG: Varicella IgG: 2.34 — ABNORMAL HIGH

## 2010-11-26 LAB — CBC
HCT: 26.6 % — ABNORMAL LOW (ref 36.0–46.0)
Hemoglobin: 10.3 g/dL — ABNORMAL LOW (ref 12.0–15.0)
MCHC: 33.3 g/dL (ref 30.0–36.0)
MCHC: 33.4 g/dL (ref 30.0–36.0)
MCV: 80.1 fL (ref 78.0–100.0)
MCV: 80.5 fL (ref 78.0–100.0)
Platelets: 145 10*3/uL — ABNORMAL LOW (ref 150–400)
RBC: 3.84 MIL/uL — ABNORMAL LOW (ref 3.87–5.11)
RDW: 15.4 % (ref 11.5–15.5)
RDW: 15.9 % — ABNORMAL HIGH (ref 11.5–15.5)
WBC: 20.8 10*3/uL — ABNORMAL HIGH (ref 4.0–10.5)

## 2010-12-08 LAB — GC/CHLAMYDIA PROBE AMP, GENITAL
Chlamydia, DNA Probe: NEGATIVE
GC Probe Amp, Genital: NEGATIVE

## 2010-12-08 LAB — DIFFERENTIAL
Basophils Absolute: 0
Basophils Relative: 1
Eosinophils Absolute: 0.2
Lymphs Abs: 1.8
Neutrophils Relative %: 48

## 2010-12-08 LAB — URINALYSIS, ROUTINE W REFLEX MICROSCOPIC
Bilirubin Urine: NEGATIVE
Glucose, UA: NEGATIVE
Ketones, ur: NEGATIVE
Nitrite: NEGATIVE
Specific Gravity, Urine: 1.026
pH: 6

## 2010-12-08 LAB — CBC
MCV: 78.6
Platelets: 217
RDW: 13.9
WBC: 4.9

## 2010-12-08 LAB — WET PREP, GENITAL: Yeast Wet Prep HPF POC: NONE SEEN

## 2010-12-08 LAB — POCT PREGNANCY, URINE
Operator id: 29026
Preg Test, Ur: NEGATIVE

## 2010-12-08 LAB — BASIC METABOLIC PANEL
BUN: 13
Chloride: 109
Creatinine, Ser: 0.74

## 2010-12-08 LAB — URINE MICROSCOPIC-ADD ON

## 2011-05-04 ENCOUNTER — Encounter: Payer: Self-pay | Admitting: Obstetrics & Gynecology

## 2011-06-24 ENCOUNTER — Encounter: Payer: Self-pay | Admitting: Obstetrics & Gynecology

## 2011-09-14 ENCOUNTER — Inpatient Hospital Stay (HOSPITAL_COMMUNITY): Payer: Medicaid Other

## 2011-09-14 ENCOUNTER — Inpatient Hospital Stay (HOSPITAL_COMMUNITY)
Admission: AD | Admit: 2011-09-14 | Discharge: 2011-09-14 | Disposition: A | Discharge status: Home / Self Care | Payer: Medicaid Other | Source: Ambulatory Visit | Attending: Obstetrics & Gynecology | Admitting: Obstetrics & Gynecology

## 2011-09-14 ENCOUNTER — Encounter (HOSPITAL_COMMUNITY): Payer: Self-pay | Admitting: *Deleted

## 2011-09-14 DIAGNOSIS — B9689 Other specified bacterial agents as the cause of diseases classified elsewhere: Secondary | ICD-10-CM | POA: Insufficient documentation

## 2011-09-14 DIAGNOSIS — N760 Acute vaginitis: Secondary | ICD-10-CM | POA: Insufficient documentation

## 2011-09-14 DIAGNOSIS — O2390 Unspecified genitourinary tract infection in pregnancy, unspecified trimester: Secondary | ICD-10-CM | POA: Insufficient documentation

## 2011-09-14 DIAGNOSIS — R109 Unspecified abdominal pain: Secondary | ICD-10-CM | POA: Insufficient documentation

## 2011-09-14 DIAGNOSIS — O26899 Other specified pregnancy related conditions, unspecified trimester: Secondary | ICD-10-CM

## 2011-09-14 DIAGNOSIS — A499 Bacterial infection, unspecified: Secondary | ICD-10-CM | POA: Insufficient documentation

## 2011-09-14 DIAGNOSIS — O209 Hemorrhage in early pregnancy, unspecified: Secondary | ICD-10-CM | POA: Insufficient documentation

## 2011-09-14 HISTORY — DX: Essential (primary) hypertension: I10

## 2011-09-14 LAB — URINALYSIS, ROUTINE W REFLEX MICROSCOPIC
Bilirubin Urine: NEGATIVE
Ketones, ur: NEGATIVE mg/dL
Protein, ur: NEGATIVE mg/dL
Urobilinogen, UA: 0.2 mg/dL (ref 0.0–1.0)

## 2011-09-14 LAB — CBC
HCT: 37.4 % (ref 36.0–46.0)
MCHC: 32.4 g/dL (ref 30.0–36.0)
MCV: 80.3 fL (ref 78.0–100.0)
RDW: 14.4 % (ref 11.5–15.5)

## 2011-09-14 LAB — WET PREP, GENITAL

## 2011-09-14 LAB — URINE MICROSCOPIC-ADD ON

## 2011-09-14 MED ORDER — METRONIDAZOLE 500 MG PO TABS: 500.0000 mg | ORAL_TABLET | Freq: Two times a day (BID) | ORAL | Status: AC

## 2011-09-14 MED ORDER — PROMETHAZINE HCL 25 MG PO TABS: 25.0000 mg | ORAL_TABLET | Freq: Four times a day (QID) | ORAL | Status: DC | PRN

## 2011-09-14 MED ORDER — ACETAMINOPHEN 325 MG PO TABS: 650.0000 mg | ORAL_TABLET | Freq: Once | ORAL | Status: DC

## 2011-09-14 NOTE — MAU Provider Note (Signed)
  History     CSN: [id number]  Arrival date and time: 09/14/11 1450   None     Chief Complaint  Patient presents with  . Possible Pregnancy  . Abdominal Pain  . Vaginal Bleeding   HPI Pt is [redacted]weeks pregnant by LMP 08/17/2011.  Pt started cramping and bleeding on 7/22 and thought her period was starting.  She took a home pregnancy test which was positive.  Past Medical History  Diagnosis Date  . Hypertension     Past Surgical History  Procedure Date  . Cesarean section   . Oophrectomy     Family History  Problem Relation Age of Onset  . Hypertension Mother   . Hypertension Father     History  Substance Use Topics  . Smoking status: Former Smoker    Quit date: 09/14/2011  . Smokeless tobacco: Not on file  . Alcohol Use: No    Allergies: No Known Allergies  Prescriptions prior to admission  Medication Sig Dispense Refill  . acetaminophen (TYLENOL) 500 MG tablet Take 500 mg by mouth every 6 (six) hours as needed. headache        Review of Systems  Constitutional: Negative for fever and chills.  Gastrointestinal: Positive for abdominal pain. Negative for nausea, vomiting, diarrhea and constipation.  Genitourinary: Negative for dysuria, urgency and frequency.  Neurological: Negative for dizziness.   Physical Exam   Blood pressure 130/90, pulse 86, temperature 99.1 F (37.3 C), temperature source Oral, resp. rate 16, height 5\' 1"  (1.549 m), weight 157 lb 9.6 oz (71.487 kg), last menstrual period 08/17/2011, SpO2 100.00%.  Physical Exam  Nursing note and vitals reviewed. Constitutional: She is oriented to person, place, and time. She appears well-developed and well-nourished.  HENT:  Head: Normocephalic.  Eyes: Pupils are equal, round, and reactive to light.  Neck: Normal range of motion. Neck supple.  Cardiovascular: Normal rate.   Respiratory: Effort normal.  GI: Soft. She exhibits no distension. There is no tenderness. There is no rebound and no guarding.   Genitourinary:       Scant amount of blood tinged frothy white discharge; cervix clean NT; uterus NSSC; adnexal without palpable enlargement or tenderness  Musculoskeletal: Normal range of motion.  Neurological: She is alert and oriented to person, place, and time.  Skin: Skin is warm and dry.  Psychiatric: She has a normal mood and affect.    MAU Course  Procedures   Quant 403 Wet prep-few clues Tylenol given for cramping with relief of pain  Assessment and Plan  abd pain in early pregnancy F/u in 48 hours for repeat [facility]-prescription for Flagyl 500 mg BID for 7 days Ectopic precautions  Alaya Iverson 09/14/2011, 5:25 PM

## 2011-09-14 NOTE — MAU Note (Signed)
Patient states she had a positive home pregnancy test yesterday. Had started cramping and spotting on 7-22 thinking her period was coming.

## 2011-09-14 NOTE — Progress Notes (Signed)
Pt states cramping is bearable until she starts walking.

## 2011-09-14 NOTE — MAU Note (Signed)
Pt states Monday she was having cramping and bleeding. Pt states she took a home pregnancy test that was positive. Pt states she still continues to have bleeding and cramping

## 2011-09-15 LAB — GC/CHLAMYDIA PROBE AMP, GENITAL: GC Probe Amp, Genital: NEGATIVE

## 2011-09-16 ENCOUNTER — Inpatient Hospital Stay (HOSPITAL_COMMUNITY)
Admission: AD | Admit: 2011-09-16 | Discharge: 2011-09-16 | Disposition: A | Discharge status: Home / Self Care | Payer: Medicaid Other | Source: Ambulatory Visit | Attending: Obstetrics & Gynecology | Admitting: Obstetrics & Gynecology

## 2011-09-16 ENCOUNTER — Encounter (HOSPITAL_COMMUNITY): Payer: Self-pay | Admitting: *Deleted

## 2011-09-16 DIAGNOSIS — O4690 Antepartum hemorrhage, unspecified, unspecified trimester: Secondary | ICD-10-CM

## 2011-09-16 DIAGNOSIS — Z1389 Encounter for screening for other disorder: Secondary | ICD-10-CM

## 2011-09-16 DIAGNOSIS — O209 Hemorrhage in early pregnancy, unspecified: Secondary | ICD-10-CM | POA: Insufficient documentation

## 2011-09-16 HISTORY — DX: Headache: R51

## 2011-09-16 HISTORY — DX: Chlamydial infection, unspecified: A74.9

## 2011-09-16 HISTORY — DX: Unspecified ovarian cyst, unspecified side: N83.209

## 2011-09-16 HISTORY — DX: Reserved for concepts with insufficient information to code with codable children: IMO0002

## 2011-09-16 HISTORY — DX: Unspecified abnormal cytological findings in specimens from cervix uteri: R87.619

## 2011-09-16 NOTE — MAU Provider Note (Signed)
  History     CSN: [id number]  Arrival date and time: 09/16/11 1453   None     Chief Complaint  Patient presents with  . Follow-up   HPI  Pt is here for follow-up BHCG, seen on 09/14/11 for pelvic cramping and bleeding.  BHCG was 403 and ultrasound showed no IUP or adnexal mass.  GC/CT neg x 2, pt treated for bacterial vaginosis.  Here today with same amount of spotting and pain.  Pain is midpelvic and not unilateral in nature.    Past Medical History  Diagnosis Date  . Hypertension   . Ovarian cyst   . Headache   . Abnormal Pap smear     colpo, ok since  . Chlamydia     Past Surgical History  Procedure Date  . Cesarean section   . Oophrectomy 2007    Right     Family History  Problem Relation Age of Onset  . Hypertension Mother   . Hypertension Father   . Other Neg Hx     History  Substance Use Topics  . Smoking status: Former Smoker    Types: Cigarettes    Quit date: 09/14/2011  . Smokeless tobacco: Never Used   Comment: quit with pos preg  . Alcohol Use: No    Allergies: No Known Allergies  Prescriptions prior to admission  Medication Sig Dispense Refill  . acetaminophen (TYLENOL) 500 MG tablet Take 500 mg by mouth every 6 (six) hours as needed. headache      . metroNIDAZOLE (FLAGYL) 500 MG tablet Take 1 tablet (500 mg total) by mouth 2 (two) times daily.  14 tablet  0  . promethazine (PHENERGAN) 25 MG tablet Take 1 tablet (25 mg total) by mouth every 6 (six) hours as needed for nausea.  30 tablet  0    ROS See HPI Physical Exam   Blood pressure 122/82, pulse 85, temperature 98.5 F (36.9 C), temperature source Oral, resp. rate 18, height 5' 1.5" (1.562 m), weight 72.576 kg (160 lb), last menstrual period 08/17/2011.  Physical Exam  Constitutional: She is oriented to person, place, and time. She appears well-developed and well-nourished.  HENT:  Head: Normocephalic.  Neck: Normal range of motion. Neck supple.  GI: Soft. There is no tenderness  (midpelvic).  Neurological: She is alert and oriented to person, place, and time. She has normal reflexes.  Skin: Skin is warm and dry.    MAU Course  Procedures  Results for orders placed during the hospital encounter of 09/16/11 (from the past 24 hour(s))  HCG, QUANTITATIVE, PREGNANCY     Status: Abnormal   Collection Time   09/16/11  3:30 PM      Component Value Range   hCG, Beta Chain, Quant, S 1138 (*) <5 mIU/mL   Consulted with Dr. Debroah Loop > reviewed HPI/results > ok to repeat ultrasound in 7 days with ectopic precautions.  Assessment and Plan  Bleeding in Pregnancy  Plan: DC to home Ectopic precautions Follow-up with ultrasound in 7 days.  [facility] 09/16/2011, 5:45 PM

## 2011-09-16 NOTE — MAU Note (Signed)
Still having some cramping.  Continues to spot, usually more in the morning and then tapers off.

## 2011-09-19 NOTE — MAU Provider Note (Signed)
Agree with note ARNOLD,JAMES  

## 2011-09-23 ENCOUNTER — Inpatient Hospital Stay (HOSPITAL_COMMUNITY)
Admission: AD | Admit: 2011-09-23 | Discharge: 2011-09-23 | Disposition: A | Discharge status: Home / Self Care | Payer: Medicaid Other | Source: Ambulatory Visit | Attending: Obstetrics and Gynecology | Admitting: Obstetrics and Gynecology

## 2011-09-23 ENCOUNTER — Ambulatory Visit (HOSPITAL_COMMUNITY)
Admission: RE | Admit: 2011-09-23 | Discharge: 2011-09-23 | Disposition: A | Discharge status: Home / Self Care | Payer: Medicaid Other | Source: Ambulatory Visit | Attending: Family | Admitting: Family

## 2011-09-23 ENCOUNTER — Other Ambulatory Visit (HOSPITAL_COMMUNITY): Payer: Self-pay | Admitting: Family

## 2011-09-23 DIAGNOSIS — O209 Hemorrhage in early pregnancy, unspecified: Secondary | ICD-10-CM | POA: Insufficient documentation

## 2011-09-23 DIAGNOSIS — Z3490 Encounter for supervision of normal pregnancy, unspecified, unspecified trimester: Secondary | ICD-10-CM

## 2011-09-23 DIAGNOSIS — O3680X0 Pregnancy with inconclusive fetal viability, not applicable or unspecified: Secondary | ICD-10-CM | POA: Insufficient documentation

## 2011-09-23 DIAGNOSIS — O4690 Antepartum hemorrhage, unspecified, unspecified trimester: Secondary | ICD-10-CM

## 2011-09-23 DIAGNOSIS — O99891 Other specified diseases and conditions complicating pregnancy: Secondary | ICD-10-CM | POA: Insufficient documentation

## 2011-09-23 MED ORDER — PROMETHAZINE HCL 25 MG PO TABS: 25.0000 mg | ORAL_TABLET | Freq: Four times a day (QID) | ORAL | Status: DC | PRN

## 2011-09-23 NOTE — MAU Provider Note (Signed)
  History     CSN: [id number]  Arrival date and time: 09/23/11 1001  Seen by provider at 10:40    Chief Complaint  Patient presents with  . Follow-up   HPI [name] 26 y.o. [redacted]w[redacted]d  Here today for viability ultrasound.  Having some nausea.  No vomiting.  Has applied for Medicaid and is awaiting card to begin prenatal care.  OB History    Grav Para Term Preterm Abortions TAB SAB Ect Mult Living   4 3 3  0 0 0 0 0 0 3      Past Medical History  Diagnosis Date  . Hypertension   . Ovarian cyst   . Headache   . Abnormal Pap smear     colpo, ok since  . Chlamydia     Past Surgical History  Procedure Date  . Cesarean section   . Oophrectomy 2007    Right     Family History  Problem Relation Age of Onset  . Hypertension Mother   . Hypertension Father   . Other Neg Hx     History  Substance Use Topics  . Smoking status: Former Smoker    Types: Cigarettes    Quit date: 09/14/2011  . Smokeless tobacco: Never Used   Comment: quit with pos preg  . Alcohol Use: No    Allergies: No Known Allergies  Prescriptions prior to admission  Medication Sig Dispense Refill  . acetaminophen (TYLENOL) 500 MG tablet Take 500 mg by mouth every 6 (six) hours as needed. headache      . metroNIDAZOLE (FLAGYL) 500 MG tablet Take 1 tablet (500 mg total) by mouth 2 (two) times daily.  14 tablet  0  . promethazine (PHENERGAN) 25 MG tablet Take 1 tablet (25 mg total) by mouth every 6 (six) hours as needed for nausea.  30 tablet  0    ROS Physical Exam   Blood pressure 127/82, pulse 77, temperature 98.7 F (37.1 C), temperature source Oral, resp. rate 16, last menstrual period 08/17/2011, SpO2 100.00%.  Physical Exam  Nursing note and vitals reviewed. Constitutional: She is oriented to person, place, and time. She appears well-developed and well-nourished.  HENT:  Head: Normocephalic.  Eyes: EOM are normal.  Neck: Neck supple.  Musculoskeletal: Normal range of motion.    Neurological: She is alert and oriented to person, place, and time.  Skin: Skin is warm and dry.  Psychiatric: She has a normal mood and affect.    MAU Course  Procedures  MDM   Assessment and Plan  IUP with yolk sac, no fetal pole  Plan Begin prenatal care rx Phenergan 25 mg one po q 4-6 hours as needed for nausea.  BURLESON,TERRI 09/23/2011, 10:37 AM

## 2011-09-23 NOTE — MAU Note (Signed)
Patient to MAU after a confirmation of viability ultrasound. Patient states she is having no pain or bleeding today but does have spotting and cramping on and off. Reports nausea on and off.

## 2011-09-26 NOTE — MAU Provider Note (Signed)
Agree with above note.  Jet Armbrust 09/26/2011 10:15 AM   

## 2011-09-27 ENCOUNTER — Inpatient Hospital Stay (HOSPITAL_COMMUNITY)
Admission: AD | Admit: 2011-09-27 | Discharge: 2011-09-27 | Disposition: A | Discharge status: Home / Self Care | Payer: Medicaid Other | Source: Ambulatory Visit | Attending: Obstetrics & Gynecology | Admitting: Obstetrics & Gynecology

## 2011-09-27 ENCOUNTER — Encounter (HOSPITAL_COMMUNITY): Payer: Self-pay

## 2011-09-27 DIAGNOSIS — O210 Mild hyperemesis gravidarum: Secondary | ICD-10-CM | POA: Insufficient documentation

## 2011-09-27 DIAGNOSIS — O219 Vomiting of pregnancy, unspecified: Secondary | ICD-10-CM

## 2011-09-27 DIAGNOSIS — O26851 Spotting complicating pregnancy, first trimester: Secondary | ICD-10-CM

## 2011-09-27 DIAGNOSIS — O26859 Spotting complicating pregnancy, unspecified trimester: Secondary | ICD-10-CM | POA: Insufficient documentation

## 2011-09-27 LAB — URINALYSIS, ROUTINE W REFLEX MICROSCOPIC
Glucose, UA: NEGATIVE mg/dL
Ketones, ur: NEGATIVE mg/dL
Protein, ur: NEGATIVE mg/dL
Urobilinogen, UA: 0.2 mg/dL (ref 0.0–1.0)

## 2011-09-27 LAB — URINE MICROSCOPIC-ADD ON

## 2011-09-27 LAB — HCG, QUANTITATIVE, PREGNANCY: hCG, Beta Chain, Quant, S: 37544 m[IU]/mL — ABNORMAL HIGH (ref <5)

## 2011-09-27 MED ORDER — ONDANSETRON HCL 4 MG PO TABS: 4.0000 mg | ORAL_TABLET | Freq: Three times a day (TID) | ORAL | Status: AC | PRN

## 2011-09-27 MED ORDER — ONDANSETRON 8 MG PO TBDP
8.0000 mg | ORAL_TABLET | Freq: Once | ORAL | Status: AC
  Administered 2011-09-27: 8 mg via ORAL
  Filled 2011-09-27: qty 1

## 2011-09-27 NOTE — MAU Provider Note (Signed)
History     CSN: [id number]  Arrival date and time: 09/27/11 4098   First Provider Initiated Contact with Patient 09/27/11 0745      Chief Complaint  Patient presents with  . Emesis During Pregnancy   HPI  Pt reports vomiting since 2 am, unable to keep anything down. Also reports more spotting and cramping.  Seen on 09/23/11.  Ultrasound showed a IUGS, +yolk sak, no fetal pole.  BHCG 1138.  O POS  Past Medical History  Diagnosis Date  . Hypertension   . Ovarian cyst   . Headache   . Abnormal Pap smear     colpo, ok since  . Chlamydia     Past Surgical History  Procedure Date  . Cesarean section   . Oophrectomy 2007    Right     Family History  Problem Relation Age of Onset  . Hypertension Mother   . Hypertension Father   . Other Neg Hx     History  Substance Use Topics  . Smoking status: Former Smoker    Types: Cigarettes    Quit date: 09/14/2011  . Smokeless tobacco: Never Used   Comment: quit with pos preg  . Alcohol Use: No    Allergies: No Known Allergies  Prescriptions prior to admission  Medication Sig Dispense Refill  . acetaminophen (TYLENOL) 500 MG tablet Take 500 mg by mouth every 6 (six) hours as needed. headache      . promethazine (PHENERGAN) 25 MG tablet Take 1 tablet (25 mg total) by mouth every 6 (six) hours as needed for nausea. Sedation precautions.  Can take 1/2 tablet for mild symptoms.  30 tablet  0  . promethazine (PHENERGAN) 25 MG tablet Take 1 tablet (25 mg total) by mouth every 6 (six) hours as needed for nausea.  30 tablet  0    Review of Systems  Gastrointestinal: Positive for nausea, vomiting and abdominal pain.  Genitourinary:       Vaginal bleeding  All other systems reviewed and are negative.   Physical Exam   Blood pressure 136/78, pulse 78, temperature 97.4 F (36.3 C), temperature source Oral, resp. rate 20, height 5' (1.524 m), weight 162 lb (73.483 kg), last menstrual period 08/17/2011, SpO2 100.00%.  Physical  Exam  Constitutional: She is oriented to person, place, and time. She appears well-developed and well-nourished.  HENT:  Head: Normocephalic.  Mouth/Throat: Mucous membranes are dry.  Neck: Normal range of motion. Neck supple.  Cardiovascular: Normal rate, regular rhythm and normal heart sounds.   Respiratory: Effort normal and breath sounds normal.  GI: There is no tenderness.  Genitourinary: No bleeding around the vagina. No vaginal discharge found.  Neurological: She is alert and oriented to person, place, and time. She has normal reflexes.  Skin: Skin is warm and dry. She is not diaphoretic.    MAU Course  Procedures Results for orders placed during the hospital encounter of 09/27/11 (from the past 24 hour(s))  URINALYSIS, ROUTINE W REFLEX MICROSCOPIC     Status: Abnormal   Collection Time   09/27/11  7:00 AM      Component Value Range   Color, Urine YELLOW  YELLOW   APPearance CLEAR  CLEAR   Specific Gravity, Urine 1.025  1.005 - 1.030   pH 7.5  5.0 - 8.0   Glucose, UA NEGATIVE  NEGATIVE mg/dL   Hgb urine dipstick SMALL (*) NEGATIVE   Bilirubin Urine NEGATIVE  NEGATIVE   Ketones, ur NEGATIVE  NEGATIVE  mg/dL   Protein, ur NEGATIVE  NEGATIVE mg/dL   Urobilinogen, UA 0.2  0.0 - 1.0 mg/dL   Nitrite NEGATIVE  NEGATIVE   Leukocytes, UA NEGATIVE  NEGATIVE  URINE MICROSCOPIC-ADD ON     Status: Abnormal   Collection Time   09/27/11  7:00 AM      Component Value Range   Squamous Epithelial / LPF FEW (*) RARE   RBC / HPF 3-6  <3 RBC/hpf   Urine-Other MUCOUS PRESENT     PO Zofran ODT  Report given to V. Katrinka Blazing who assumes care of pt at [phone].  Assessment and Plan    [facility] 09/27/2011, 7:48 AM   Assumed care of patient at [phone]. Patient tolerating crackers.  Assessment Nausea and vomiting of pregnancy First trimester spotting, IUP previously verified, appropriate rise in hCG.  Plan Discharge home Hyperemesis precautions reviewed Follow-up Information    Follow  up with [facility].      Follow up with [facility]. (As needed if symptoms worsen)    Contact information:   [address] [place] [address] [phone]        Medication List  As of 09/27/2011  9:36 AM   START taking these medications         ondansetron 4 MG tablet   Commonly known as: ZOFRAN   Take 1 tablet (4 mg total) by mouth every 8 (eight) hours as needed for nausea.         CONTINUE taking these medications         acetaminophen 500 MG tablet   Commonly known as: TYLENOL      promethazine 25 MG tablet   Commonly known as: PHENERGAN   Take 1 tablet (25 mg total) by mouth every 6 (six) hours as needed for nausea. Sedation precautions.  Can take 1/2 tablet for mild symptoms.          Where to get your medications    These are the prescriptions that you need to pick up.   You may get these medications from any pharmacy.         ondansetron 4 MG tablet            [place], [place] 09/27/2011 9:37 AM

## 2011-09-27 NOTE — MAU Note (Signed)
Pt reports vomiting since 2 am, unable to keep anything down. Spotting more. Pt reports she is 5 weeks preg and has been spotting

## 2011-09-27 NOTE — MAU Note (Signed)
Pt states n/v, denies diarrhea, was recently seen in MAU.

## 2011-09-27 NOTE — MAU Provider Note (Signed)
Medical Screening exam and patient care preformed by advanced practice provider.  Agree with the above management.  

## 2011-10-01 ENCOUNTER — Encounter (HOSPITAL_COMMUNITY): Payer: Self-pay

## 2011-10-01 ENCOUNTER — Inpatient Hospital Stay (HOSPITAL_COMMUNITY)
Admission: AD | Admit: 2011-10-01 | Discharge: 2011-10-01 | Disposition: A | Discharge status: Home / Self Care | Payer: Medicaid Other | Source: Ambulatory Visit | Attending: Obstetrics & Gynecology | Admitting: Obstetrics & Gynecology

## 2011-10-01 ENCOUNTER — Inpatient Hospital Stay (HOSPITAL_COMMUNITY): Payer: Medicaid Other

## 2011-10-01 DIAGNOSIS — O4690 Antepartum hemorrhage, unspecified, unspecified trimester: Secondary | ICD-10-CM

## 2011-10-01 DIAGNOSIS — O209 Hemorrhage in early pregnancy, unspecified: Secondary | ICD-10-CM | POA: Insufficient documentation

## 2011-10-01 DIAGNOSIS — N831 Corpus luteum cyst of ovary, unspecified side: Secondary | ICD-10-CM | POA: Insufficient documentation

## 2011-10-01 DIAGNOSIS — O34599 Maternal care for other abnormalities of gravid uterus, unspecified trimester: Secondary | ICD-10-CM | POA: Insufficient documentation

## 2011-10-01 DIAGNOSIS — R109 Unspecified abdominal pain: Secondary | ICD-10-CM | POA: Insufficient documentation

## 2011-10-01 DIAGNOSIS — O210 Mild hyperemesis gravidarum: Secondary | ICD-10-CM | POA: Insufficient documentation

## 2011-10-01 LAB — URINALYSIS, ROUTINE W REFLEX MICROSCOPIC
Glucose, UA: NEGATIVE mg/dL
Ketones, ur: NEGATIVE mg/dL
Leukocytes, UA: NEGATIVE
Nitrite: NEGATIVE
Specific Gravity, Urine: 1.025 (ref 1.005–1.030)
pH: 6 (ref 5.0–8.0)

## 2011-10-01 LAB — URINE MICROSCOPIC-ADD ON

## 2011-10-01 NOTE — MAU Note (Signed)
Lower Abdominal cramping & intermittent sharp pains since yesterday. Brown spotting since yesterday. Nausea/vomiting, can't keep meds down for nausea.

## 2011-10-01 NOTE — MAU Provider Note (Signed)
History     CSN: [id number]  Arrival date and time: 10/01/11 3086   First Provider Initiated Contact with Patient 10/01/11 [phone]      Chief Complaint  Patient presents with  . Morning Sickness  . Vaginal Bleeding  . Abdominal Cramping   HPI This is a 26 y.o. female at [redacted]w[redacted]d who presents with c/o severe pelvic cramping and brown discharge. States cannot keep Phenergan down and when she takes zofran, she still vomits. Is worried about miscarriage.  OB History    Grav Para Term Preterm Abortions TAB SAB Ect Mult Living   4 3 3  0 0 0 0 0 0 3      Past Medical History  Diagnosis Date  . Hypertension   . Ovarian cyst   . Headache   . Abnormal Pap smear     colpo, ok since  . Chlamydia     Past Surgical History  Procedure Date  . Cesarean section   . Oophrectomy 2007    Right , d/t cyst    Family History  Problem Relation Age of Onset  . Hypertension Mother   . Hypertension Father   . Other Neg Hx     History  Substance Use Topics  . Smoking status: Former Smoker    Quit date: 09/14/2011  . Smokeless tobacco: Never Used   Comment: quit with pos preg  . Alcohol Use: No    Allergies: No Known Allergies  Prescriptions prior to admission  Medication Sig Dispense Refill  . acetaminophen (TYLENOL) 500 MG tablet Take 500 mg by mouth every 6 (six) hours as needed. headache      . ondansetron (ZOFRAN) 4 MG tablet Take 1 tablet (4 mg total) by mouth every 8 (eight) hours as needed for nausea.  10 tablet  6  . promethazine (PHENERGAN) 25 MG tablet Take 1 tablet (25 mg total) by mouth every 6 (six) hours as needed for nausea. Sedation precautions.  Can take 1/2 tablet for mild symptoms.  30 tablet  0    ROS As in HPI  Physical Exam   Blood pressure 129/73, pulse 76, temperature 98.5 F (36.9 C), temperature source Oral, resp. rate 18, height 5' (1.524 m), weight 161 lb 12.8 oz (73.392 kg), last menstrual period 08/17/2011.  Physical Exam  Constitutional: She is  oriented to person, place, and time. She appears well-developed and well-nourished. No distress.  Cardiovascular: Normal rate.   Respiratory: Effort normal.  GI: Soft. She exhibits no distension and no mass. There is tenderness (suprapubic). There is no rebound and no guarding.  Genitourinary: Uterus normal. Vaginal discharge (light brown) found.       Uterus small Cervix long and closed  Musculoskeletal: Normal range of motion.  Neurological: She is alert and oriented to person, place, and time.  Skin: Skin is warm and dry.  Psychiatric: She has a normal mood and affect.   Results for orders placed during the hospital encounter of 10/01/11 (from the past 24 hour(s))  URINALYSIS, ROUTINE W REFLEX MICROSCOPIC     Status: Abnormal   Collection Time   10/01/11  5:50 AM      Component Value Range   Color, Urine YELLOW  YELLOW   APPearance CLEAR  CLEAR   Specific Gravity, Urine 1.025  1.005 - 1.030   pH 6.0  5.0 - 8.0   Glucose, UA NEGATIVE  NEGATIVE mg/dL   Hgb urine dipstick MODERATE (*) NEGATIVE   Bilirubin Urine NEGATIVE  NEGATIVE  Ketones, ur NEGATIVE  NEGATIVE mg/dL   Protein, ur NEGATIVE  NEGATIVE mg/dL   Urobilinogen, UA 0.2  0.0 - 1.0 mg/dL   Nitrite NEGATIVE  NEGATIVE   Leukocytes, UA NEGATIVE  NEGATIVE  URINE MICROSCOPIC-ADD ON     Status: Abnormal   Collection Time   10/01/11  5:50 AM      Component Value Range   Squamous Epithelial / LPF FEW (*) RARE   WBC, UA 0-2  <3 WBC/hpf   RBC / HPF 7-10  <3 RBC/hpf   Bacteria, UA RARE  RARE   Urine-Other MUCOUS PRESENT      MAU Course  Procedures  MDM Will recheck [place].  Last one done 8/2 showed yolk sac with no fetal pole. US Ob Comp Less 14 Wks US Ob Transvaginal  10/01/2011  *RADIOLOGY REPORT*  Clinical Data: Vaginal bleeding and pelvic pain.  TRANSVAGINAL OBSTETRIC US  Technique:  Transvaginal ultrasound was performed for complete evaluation of the gestation as well as the maternal uterus, adnexal regions, and pelvic  cul-de-sac.  Comparison:  09/23/2011.  Intrauterine gestational sac: Single Yolk sac: Present Embryo: Present Cardiac Activity: Present Heart Rate: 122  CRL: 8.4 mm           6   w  6   d        [place] EDC: 05/20/2012.  Subchorionic hemorrhage: Moderate  Maternal uterus/adnexae: Right ovary measures 2.1 x 1.3 x 1.2 cm (per report, the patient underwent partial oophorectomy 2009).  Left ovary measures 4.8 x 3.3 x 3.3 cm, and there is an area of complex echotexture within the left ovary that is most consistent with a degenerating corpus luteum cyst.  IMPRESSION: 1.  Single viable IUP with an estimated gestational age of [redacted] weeks and 6 days with a fetal heart rate of 122 beats per minute. 2.  Moderate sized subchorionic hemorrhage.  Original Report Authenticated By: Florencia Reasons, M.D.    Assessment and Plan  A:  SIUP at [redacted]w[redacted]d       Moderate [facility]      Left CL cyst  P:  Discharge home      Zofran for nausea      Followup with [facility]  [facility] 10/01/2011, 6:32 AM

## 2011-10-02 ENCOUNTER — Inpatient Hospital Stay (HOSPITAL_COMMUNITY)
Admission: AD | Admit: 2011-10-02 | Discharge: 2011-10-02 | Disposition: A | Discharge status: Home / Self Care | Payer: Medicaid Other | Source: Ambulatory Visit | Attending: Obstetrics & Gynecology | Admitting: Obstetrics & Gynecology

## 2011-10-02 ENCOUNTER — Inpatient Hospital Stay (HOSPITAL_COMMUNITY): Payer: Medicaid Other

## 2011-10-02 ENCOUNTER — Encounter (HOSPITAL_COMMUNITY): Payer: Self-pay | Admitting: Obstetrics and Gynecology

## 2011-10-02 DIAGNOSIS — O418X90 Other specified disorders of amniotic fluid and membranes, unspecified trimester, not applicable or unspecified: Secondary | ICD-10-CM

## 2011-10-02 DIAGNOSIS — O200 Threatened abortion: Secondary | ICD-10-CM | POA: Insufficient documentation

## 2011-10-02 NOTE — MAU Provider Note (Signed)
History     CSN: [id number]  Arrival date and time: 10/02/11 1630   None     Chief Complaint  Patient presents with  . Vaginal Bleeding  . Abdominal Pain   HPI [name] is a 26 y.o. female @ [redacted]w[redacted]d gestation who presents to MAU via EMS with vaginal bleeding. She was evaluated yesterday for same and had ultrasound that showed a moderate SCH. She returned home and had brown discharge but today approximately 4 pm noted bright red blood and began having lower abdominal cramping. She describes the bleeding as equal to a period. The history was provided by the patient. Last sexual intercourse yesterday 1 pm.   OB History    Grav Para Term Preterm Abortions TAB SAB Ect Mult Living   4 3 3  0 0 0 0 0 0 3      Past Medical History  Diagnosis Date  . Hypertension   . Ovarian cyst   . Headache   . Abnormal Pap smear     colpo, ok since  . Chlamydia     Past Surgical History  Procedure Date  . Cesarean section   . Oophrectomy 2007    Right , d/t cyst    Family History  Problem Relation Age of Onset  . Hypertension Mother   . Hypertension Father   . Other Neg Hx     History  Substance Use Topics  . Smoking status: Former Smoker    Quit date: 09/14/2011  . Smokeless tobacco: Never Used   Comment: quit with pos preg  . Alcohol Use: No    Allergies: No Known Allergies  Prescriptions prior to admission  Medication Sig Dispense Refill  . acetaminophen (TYLENOL) 500 MG tablet Take 500 mg by mouth every 6 (six) hours as needed. headache      . ondansetron (ZOFRAN) 4 MG tablet Take 1 tablet (4 mg total) by mouth every 8 (eight) hours as needed for nausea.  10 tablet  6    Review of Systems  Constitutional: Negative for fever, chills and weight loss.  HENT: Negative for ear pain, nosebleeds, congestion, sore throat and neck pain.   Eyes: Negative for blurred vision, double vision, photophobia and pain.  Respiratory: Negative for cough, shortness of breath and  wheezing.   Cardiovascular: Negative for chest pain, palpitations and leg swelling.  Gastrointestinal: Positive for nausea, vomiting and abdominal pain. Negative for heartburn, diarrhea and constipation.  Genitourinary: Positive for frequency. Negative for dysuria and urgency.  Musculoskeletal: Positive for back pain. Negative for myalgias.  Skin: Negative for itching and rash.  Neurological: Negative for dizziness, sensory change, speech change, seizures, weakness and headaches.  Endo/Heme/Allergies: Does not bruise/bleed easily.  Psychiatric/Behavioral: Negative for depression. The patient is nervous/anxious. The patient does not have insomnia.    Physical Exam   Blood pressure 129/73, pulse 85, temperature 98.5 F (36.9 C), temperature source Oral, resp. rate 20, height 5' (1.524 m), weight 161 lb (73.029 kg), last menstrual period 08/17/2011.  Physical Exam  Nursing note and vitals reviewed. Constitutional: She is oriented to person, place, and time. She appears well-developed and well-nourished. No distress.  HENT:  Head: Normocephalic and atraumatic.  Eyes: EOM are normal.  Neck: Neck supple.  Cardiovascular: Normal rate.   Respiratory: Effort normal. No respiratory distress.  GI: Soft. There is no tenderness.  Genitourinary:       External genitalia without lesions, moderate blood vaginal vault. Cervix long, closed, no CMT, no adnexal tenderness.  Uterus without palpable enlargement.  Musculoskeletal: Normal range of motion.  Neurological: She is alert and oriented to person, place, and time.  Skin: Skin is warm and dry.  Psychiatric: She has a normal mood and affect. Her behavior is normal. Judgment and thought content normal.    MAU Course: @ 17:15 consult with Dr. Despina Hidden, will do f/u ultrasound for viability  Procedures Ultrasound today shows a 7 week 1 day IUP with FH 131, left CLC  Assessment: Threatened AB  Plan:  Start prenatal care   Instructions re: threatened  AB   Return as needed  NEESE,HOPE, RN, FNP, [facility] 10/02/2011, 5:01 PM  Medication List  As of 10/02/2011  5:57 PM   CONTINUE taking these medications         acetaminophen 500 MG tablet   Commonly known as: TYLENOL      ondansetron 4 MG tablet   Commonly known as: ZOFRAN   Take 1 tablet (4 mg total) by mouth every 8 (eight) hours as needed for nausea.          I have reviewed this patient's vital signs, nurses notes, appropriate labs and imaging.  Discussed with the patient and all questioned fully answered.

## 2011-10-02 NOTE — MAU Note (Signed)
"  I was here last night and was told I have a clot between the uterus and the placenta and to come back if I started bleeding.  I started bleeding about 40 minutes ago.  I have had cramping since earlier this morning.  I've been throwing up for a while and not able to keep stuff down, so I thought the cramping was coming from that."

## 2011-10-20 ENCOUNTER — Telehealth: Payer: Self-pay | Admitting: Obstetrics and Gynecology

## 2011-10-25 ENCOUNTER — Inpatient Hospital Stay (HOSPITAL_COMMUNITY): Payer: Medicaid Other

## 2011-10-25 ENCOUNTER — Telehealth: Payer: Self-pay | Admitting: Obstetrics and Gynecology

## 2011-10-25 ENCOUNTER — Encounter (HOSPITAL_COMMUNITY): Payer: Self-pay | Admitting: *Deleted

## 2011-10-25 ENCOUNTER — Inpatient Hospital Stay (HOSPITAL_COMMUNITY)
Admission: AD | Admit: 2011-10-25 | Discharge: 2011-10-25 | Disposition: A | Discharge status: Home / Self Care | Payer: Medicaid Other | Source: Ambulatory Visit | Attending: Obstetrics and Gynecology | Admitting: Obstetrics and Gynecology

## 2011-10-25 DIAGNOSIS — O021 Missed abortion: Secondary | ICD-10-CM

## 2011-10-25 DIAGNOSIS — R300 Dysuria: Secondary | ICD-10-CM | POA: Insufficient documentation

## 2011-10-25 DIAGNOSIS — O26859 Spotting complicating pregnancy, unspecified trimester: Secondary | ICD-10-CM

## 2011-10-25 LAB — COMPREHENSIVE METABOLIC PANEL
ALT: 6 U/L (ref 0–35)
AST: 20 U/L (ref 0–37)
CO2: 20 mEq/L (ref 19–32)
Chloride: 104 mEq/L (ref 96–112)
Creatinine, Ser: 0.59 mg/dL (ref 0.50–1.10)
GFR calc Af Amer: >90 mL/min (ref >90)
GFR calc non Af Amer: >90 mL/min (ref >90)
Glucose, Bld: 92 mg/dL (ref 70–99)
Sodium: 134 mEq/L — ABNORMAL LOW (ref 135–145)
Total Bilirubin: 0.1 mg/dL — ABNORMAL LOW (ref 0.3–1.2)

## 2011-10-25 LAB — CBC
Hemoglobin: 11.6 g/dL — ABNORMAL LOW (ref 12.0–15.0)
MCV: 79.4 fL (ref 78.0–100.0)
Platelets: 239 10*3/uL (ref 150–400)
RBC: 4.46 MIL/uL (ref 3.87–5.11)
WBC: 8.7 10*3/uL (ref 4.0–10.5)

## 2011-10-25 LAB — URINALYSIS, ROUTINE W REFLEX MICROSCOPIC
Bilirubin Urine: NEGATIVE
Glucose, UA: NEGATIVE mg/dL
Protein, ur: NEGATIVE mg/dL
Urobilinogen, UA: 1 mg/dL (ref 0.0–1.0)

## 2011-10-25 LAB — URINE MICROSCOPIC-ADD ON

## 2011-10-25 LAB — WET PREP, GENITAL

## 2011-10-25 MED ORDER — PROMETHAZINE HCL 12.5 MG PO TABS: 12.5000 mg | ORAL_TABLET | ORAL | Status: AC | PRN

## 2011-10-25 MED ORDER — PROMETHAZINE HCL 25 MG PO TABS: 12.5000 mg | ORAL_TABLET | Freq: Once | ORAL | Status: DC

## 2011-10-25 NOTE — MAU Note (Signed)
Pt states she started having pain sharp pains in her lower abdomen. Pt states she has had them for a couple of days now

## 2011-10-25 NOTE — Progress Notes (Signed)
Pt states she has been having spotting that turns to a yellow discharge and she starts having spotting again

## 2011-10-25 NOTE — Telephone Encounter (Signed)
TC to   Pt.   States has been seen at [facility] and diagnosed with subchorionic hemorrhage. Spotting on and off which has not increased.  States having increased abd and back pain x 1 week and  Dysuria x 2 days.  Per DD pt to MAU.

## 2011-10-25 NOTE — MAU Note (Signed)
Pt states has noted pain with urination x3-4 days, intermittent spotting, then turns into yellow vaginal discharge. All s/s began at the same time. Denies recent intercourse.

## 2011-10-25 NOTE — MAU Provider Note (Addendum)
History     CSN: [id number]  Arrival date and time: 10/25/11 1710   None     Chief Complaint  Patient presents with  . Pain with urination    HPI Pt presents c/o cramping x 2days and spotting earlier today.  She had u/s with IUP a few wks ago in MAU seen by teaching service.  Pt is currently scheduled in office at [facility] on 9/9.  She says she has routine n/v and is able to keep some food down.  Also c/o pelvic pain in general and with urination.  OB History    Grav Para Term Preterm Abortions TAB SAB Ect Mult Living   4 3 3  0 0 0 0 0 0 3      Past Medical History  Diagnosis Date  . Hypertension (reports no meds)   . Ovarian cyst   . Headache   . Abnormal Pap smear     colpo, ok since  . Chlamydia     Past Surgical History  Procedure Date  . Cesarean section x 2   . Oophrectomy 2007    Right , d/t cyst    Family History  Problem Relation Age of Onset  . Hypertension Mother   . Hypertension Father   . Other Neg Hx     History  Substance Use Topics  . Smoking status: Former Smoker    Quit date: 09/14/2011  . Smokeless tobacco: Never Used   Comment: quit with pos preg  . Alcohol Use: No    Allergies: No Known Allergies  Prescriptions prior to admission  Medication Sig Dispense Refill  . acetaminophen (TYLENOL) 500 MG tablet Take 500 mg by mouth every 6 (six) hours as needed. headache        ROS Noncontributory  Physical Exam   Blood pressure 128/72, pulse 100, temperature 98.1 F (36.7 C), temperature source Oral, resp. rate 16, last menstrual period 08/17/2011.  Physical Exam Spec no active bleeding, brown d/c noted Cervix closed and long +tenderness in suprapubic region but no CMT   MAU Course  Procedures  CBC CMET U/S Wet Prep GC/CT   Assessment and Plan  26yo at 10 2/7wks with spotting and cramping.  Currently appear stable with a little nausea and some pelvic pressure.  Will check u/s and labs.  Will give a dose of phenergan.   Once labs and u/s return will determine disposition.  ROBERTS,ANGELA Y 10/25/2011, 6:42 PM   S:  Pt reports d/c is now yellow; intermittent lower abdominal cramping.  Pt's sister is at bedside.  O:  .Marland Kitchen Filed Vitals:   10/25/11 1741  BP: 128/72  Pulse: 100  Temp: 98.1 F (36.7 C)  TempSrc: Oral  Resp: 16  .. Results for orders placed during the hospital encounter of 10/25/11 (from the past 24 hour(s))  URINALYSIS, ROUTINE W REFLEX MICROSCOPIC     Status: Abnormal   Collection Time   10/25/11  5:35 PM      Component Value Range   Color, Urine YELLOW  YELLOW   APPearance HAZY (*) CLEAR   Specific Gravity, Urine >1.030 (*) 1.005 - 1.030   pH 6.0  5.0 - 8.0   Glucose, UA NEGATIVE  NEGATIVE mg/dL   Hgb urine dipstick LARGE (*) NEGATIVE   Bilirubin Urine NEGATIVE  NEGATIVE   Ketones, ur 15 (*) NEGATIVE mg/dL   Protein, ur NEGATIVE  NEGATIVE mg/dL   Urobilinogen, UA 1.0  0.0 - 1.0 mg/dL   Nitrite NEGATIVE  NEGATIVE   Leukocytes, UA NEGATIVE  NEGATIVE  URINE MICROSCOPIC-ADD ON     Status: Abnormal   Collection Time   10/25/11  5:35 PM      Component Value Range   Squamous Epithelial / LPF FEW (*) RARE   WBC, UA 0-2  <3 WBC/hpf   RBC / HPF 11-20  <3 RBC/hpf  WET PREP, GENITAL     Status: Abnormal   Collection Time   10/25/11  6:40 PM      Component Value Range   Yeast Wet Prep HPF POC NONE SEEN  NONE SEEN   Trich, Wet Prep NONE SEEN  NONE SEEN   Clue Cells Wet Prep HPF POC NONE SEEN  NONE SEEN   WBC, Wet Prep HPF POC FEW (*) NONE SEEN  CBC     Status: Abnormal   Collection Time   10/25/11  6:45 PM      Component Value Range   WBC 8.7  4.0 - 10.5 K/uL   RBC 4.46  3.87 - 5.11 MIL/uL   Hemoglobin 11.6 (*) 12.0 - 15.0 g/dL   HCT 16.1 (*) 09.6 - 04.5 %   MCV 79.4  78.0 - 100.0 fL   MCH 26.0  26.0 - 34.0 pg   MCHC 32.8  30.0 - 36.0 g/dL   RDW 40.9  81.1 - 91.4 %   Platelets 239  150 - 400 K/uL  COMPREHENSIVE METABOLIC PANEL     Status: Abnormal   Collection Time   10/25/11  6:45  PM      Component Value Range   Sodium 134 (*) 135 - 145 mEq/L   Potassium 4.1  3.5 - 5.1 mEq/L   Chloride 104  96 - 112 mEq/L   CO2 20  19 - 32 mEq/L   Glucose, Bld 92  70 - 99 mg/dL   BUN 13  6 - 23 mg/dL   Creatinine, Ser 7.82  0.50 - 1.10 mg/dL   Calcium 9.1  8.4 - 95.6 mg/dL   Total Protein 7.1  6.0 - 8.3 g/dL   Albumin 3.3 (*) 3.5 - 5.2 g/dL   AST 20  0 - 37 U/L   ALT 6  0 - 35 U/L   Alkaline Phosphatase 61  39 - 117 U/L   Total Bilirubin 0.1 (*) 0.3 - 1.2 mg/dL   GFR calc non Af Amer >90  >90 mL/min   GFR calc Af Amer >90  >90 mL/min  *RADIOLOGY REPORT*  Clinical Data: Spotting.  OBSTETRIC <14 WK ULTRASOUND  Technique: Transabdominal ultrasound was performed for evaluation  of the gestation as well as the maternal uterus and adnexal  regions.  Comparison: 10/02/2011.  Intrauterine gestational sac: Visualized  Yolk sac: Calcified  Embryo: Visualized  Cardiac Activity: No heart activity.  CRL: 9.7 mm mm 7 w 1 d  Maternal uterus/Adnexae:  Right ovary surgically absent. Left ovary with corpus luteum cyst  measuring up to 2.2 cm.  IMPRESSION:  Nonviable intrauterine gestation. No cardiac activity noted. No  interval growth.  Result discussed with Darl Pikes ultrasound technologist and to be  called to MAU.  Original Report Authenticated By: Fuller Canada, M.D.  A:  1.  MAB at [redacted]w[redacted]d       2.  U/s showing no interval growth since 10/02/11 u/s and no FHR      3.  O pos      4. H/o [facility] on last u/s      5.  Urine  suspicious for UTI  P:  1.  Pt very upset after disc'd u/s report and tearful.  Given options of exp management, induction w/ cytotec, or D&E, and pt w/ difficulty processing all information w/ unexpected news of IUFD. 2.  Pt desires to discuss further at home and I rev'd bleeding and SAB precautions and d/c'd pt home and will have office call pt tomorrow to check on her and see if she has decided of future POC. 3.  Dr. Su Hilt notified. 4.  Urine for c&s 5.   Support given; f/u prn C. Denny Levy, [place] 10/25/11; 2120

## 2011-10-25 NOTE — Telephone Encounter (Signed)
Triage/appt req. °

## 2011-10-26 ENCOUNTER — Telehealth: Payer: Self-pay | Admitting: Obstetrics and Gynecology

## 2011-10-26 ENCOUNTER — Encounter (HOSPITAL_COMMUNITY): Payer: Self-pay | Admitting: Pharmacist

## 2011-10-26 ENCOUNTER — Encounter (HOSPITAL_COMMUNITY): Payer: Self-pay | Admitting: *Deleted

## 2011-10-26 ENCOUNTER — Other Ambulatory Visit: Payer: Self-pay | Admitting: Obstetrics and Gynecology

## 2011-10-26 NOTE — Telephone Encounter (Signed)
Tc to pt regarding D&E, informed is scheduled w/ ND tomorrow am @ 0815, pt advised to arrive @ [facility] by 0645 and to be NPO after midnight.  Pt scheduled for post-op appt on Tuesday 11/15/11 with ND.  Pt voices agreement to all.

## 2011-10-26 NOTE — Telephone Encounter (Signed)
Pt called regarding msg below.  Pt says she has decided to go ahead with the D&E, is having moderate cramping without any bleeding.  Informed will let on call provider know of decision and should expect a call back about instructions.  Pt voices understanding.

## 2011-10-26 NOTE — Telephone Encounter (Signed)
Message copied by Delon Sacramento on Wed Oct 26, 2011  9:45 AM ------      Message from: Carolanne Grumbling H      Created: Wed Oct 26, 2011  8:02 AM      Regarding: MAB       Pt seen in MAU last night for spotting and cramping and MAB on u/s.  She is [redacted]w[redacted]d, but embryo only [redacted]w[redacted]d w/ no heartbeat.        Pt very upset, and undecided on future plan.      Given options of cytotec, expectant management, or D&E.      I told pt office would call and check on her this morning, and see if she has decided on plan of care.      Dr. Su Hilt and Geraldine Contras are both aware of pt's status.            Thanks,      [employer]

## 2011-10-26 NOTE — Telephone Encounter (Signed)
D&E Scheduled for 10/27/11 @ 8:15 with ND. -Adrianne Pridgen

## 2011-10-27 ENCOUNTER — Encounter (HOSPITAL_COMMUNITY)
Admission: RE | Disposition: A | Discharge status: Home / Self Care | Payer: Self-pay | Source: Ambulatory Visit | Attending: Obstetrics and Gynecology

## 2011-10-27 ENCOUNTER — Ambulatory Visit (HOSPITAL_COMMUNITY)
Admission: RE | Admit: 2011-10-27 | Discharge: 2011-10-27 | Disposition: A | Discharge status: Home / Self Care | Payer: Medicaid Other | Source: Ambulatory Visit | Attending: Obstetrics and Gynecology | Admitting: Obstetrics and Gynecology

## 2011-10-27 ENCOUNTER — Ambulatory Visit (HOSPITAL_COMMUNITY): Payer: Medicaid Other | Admitting: Anesthesiology

## 2011-10-27 ENCOUNTER — Encounter (HOSPITAL_COMMUNITY): Payer: Self-pay | Admitting: Anesthesiology

## 2011-10-27 ENCOUNTER — Encounter (HOSPITAL_COMMUNITY): Payer: Self-pay | Admitting: *Deleted

## 2011-10-27 DIAGNOSIS — O021 Missed abortion: Secondary | ICD-10-CM

## 2011-10-27 HISTORY — PX: DILATION AND EVACUATION: SHX1459

## 2011-10-27 SURGERY — DILATION AND EVACUATION, UTERUS
Anesthesia: Monitor Anesthesia Care | Site: Uterus | Wound class: Clean Contaminated

## 2011-10-27 MED ORDER — FENTANYL CITRATE 0.05 MG/ML IJ SOLN
INTRAMUSCULAR | Status: DC | PRN
  Administered 2011-10-27: 100 ug via INTRAVENOUS

## 2011-10-27 MED ORDER — ONDANSETRON HCL 4 MG/2ML IJ SOLN
INTRAMUSCULAR | Status: DC | PRN
  Administered 2011-10-27: 4 mg via INTRAVENOUS

## 2011-10-27 MED ORDER — KETOROLAC TROMETHAMINE 30 MG/ML IJ SOLN: 15.0000 mg | Freq: Once | INTRAMUSCULAR | Status: DC | PRN

## 2011-10-27 MED ORDER — LIDOCAINE HCL (CARDIAC) 20 MG/ML IV SOLN
INTRAVENOUS | Status: AC
  Filled 2011-10-27: qty 5

## 2011-10-27 MED ORDER — FENTANYL CITRATE 0.05 MG/ML IJ SOLN: 25.0000 ug | INTRAMUSCULAR | Status: DC | PRN

## 2011-10-27 MED ORDER — PROPOFOL 10 MG/ML IV EMUL
INTRAVENOUS | Status: AC
  Filled 2011-10-27: qty 20

## 2011-10-27 MED ORDER — LIDOCAINE HCL 2 % IJ SOLN
INTRAMUSCULAR | Status: DC | PRN
  Administered 2011-10-27: 20 mL

## 2011-10-27 MED ORDER — FENTANYL CITRATE 0.05 MG/ML IJ SOLN
INTRAMUSCULAR | Status: AC
  Filled 2011-10-27: qty 5

## 2011-10-27 MED ORDER — MIDAZOLAM HCL 5 MG/5ML IJ SOLN
INTRAMUSCULAR | Status: DC | PRN
  Administered 2011-10-27: 2 mg via INTRAVENOUS

## 2011-10-27 MED ORDER — LIDOCAINE HCL 2 % IJ SOLN
INTRAMUSCULAR | Status: AC
  Filled 2011-10-27: qty 1

## 2011-10-27 MED ORDER — PROMETHAZINE HCL 25 MG/ML IJ SOLN: 6.2500 mg | INTRAMUSCULAR | Status: DC | PRN

## 2011-10-27 MED ORDER — PROPOFOL 10 MG/ML IV EMUL
INTRAVENOUS | Status: DC | PRN
  Administered 2011-10-27 (×4): 50 mg via INTRAVENOUS

## 2011-10-27 MED ORDER — KETOROLAC TROMETHAMINE 30 MG/ML IJ SOLN
INTRAMUSCULAR | Status: DC | PRN
  Administered 2011-10-27: 30 mg via INTRAVENOUS

## 2011-10-27 MED ORDER — ACETAMINOPHEN 325 MG PO TABS
ORAL_TABLET | ORAL | Status: AC
  Filled 2011-10-27: qty 2

## 2011-10-27 MED ORDER — ONDANSETRON HCL 4 MG/2ML IJ SOLN
INTRAMUSCULAR | Status: AC
  Filled 2011-10-27: qty 2

## 2011-10-27 MED ORDER — ACETAMINOPHEN 325 MG PO TABS
650.0000 mg | ORAL_TABLET | Freq: Once | ORAL | Status: AC
  Administered 2011-10-27: 650 mg via ORAL

## 2011-10-27 MED ORDER — IBUPROFEN 600 MG PO TABS: 600.0000 mg | ORAL_TABLET | Freq: Four times a day (QID) | ORAL | Status: AC | PRN

## 2011-10-27 MED ORDER — LACTATED RINGERS IV SOLN
INTRAVENOUS | Status: DC
  Administered 2011-10-27 (×2): via INTRAVENOUS
  Administered 2011-10-27: 125 mL/h via INTRAVENOUS

## 2011-10-27 MED ORDER — LIDOCAINE HCL (CARDIAC) 20 MG/ML IV SOLN
INTRAVENOUS | Status: DC | PRN
  Administered 2011-10-27: 40 mg via INTRAVENOUS

## 2011-10-27 MED ORDER — DOXYCYCLINE HYCLATE 100 MG PO TABS: 100.0000 mg | ORAL_TABLET | Freq: Two times a day (BID) | ORAL | Status: AC

## 2011-10-27 MED ORDER — MIDAZOLAM HCL 2 MG/2ML IJ SOLN
INTRAMUSCULAR | Status: AC
  Filled 2011-10-27: qty 2

## 2011-10-27 MED ORDER — KETOROLAC TROMETHAMINE 30 MG/ML IJ SOLN
INTRAMUSCULAR | Status: AC
  Filled 2011-10-27: qty 1

## 2011-10-27 MED ORDER — MIDAZOLAM HCL 2 MG/2ML IJ SOLN: 0.5000 mg | Freq: Once | INTRAMUSCULAR | Status: DC | PRN

## 2011-10-27 MED ORDER — MEPERIDINE HCL 25 MG/ML IJ SOLN: 6.2500 mg | INTRAMUSCULAR | Status: DC | PRN

## 2011-10-27 SURGICAL SUPPLY — 16 items
CATH ROBINSON RED A/P 16FR (CATHETERS) ×2 IMPLANT
CLOTH BEACON ORANGE TIMEOUT ST (SAFETY) ×2 IMPLANT
DECANTER SPIKE VIAL GLASS SM (MISCELLANEOUS) ×2 IMPLANT
GLOVE BIOGEL PI IND STRL 7.0 (GLOVE) ×1 IMPLANT
GLOVE BIOGEL PI INDICATOR 7.0 (GLOVE) ×1
GOWN PREVENTION PLUS LG XLONG (DISPOSABLE) ×2 IMPLANT
KIT BERKELEY 1ST TRIMESTER 3/8 (MISCELLANEOUS) ×2 IMPLANT
NEEDLE SPNL 22GX3.5 QUINCKE BK (NEEDLE) ×2 IMPLANT
NS IRRIG 1000ML POUR BTL (IV SOLUTION) ×2 IMPLANT
PACK VAGINAL MINOR WOMEN LF (CUSTOM PROCEDURE TRAY) ×2 IMPLANT
PAD OB MATERNITY 4.3X12.25 (PERSONAL CARE ITEMS) ×2 IMPLANT
PAD PREP 24X48 CUFFED NSTRL (MISCELLANEOUS) ×2 IMPLANT
SET BERKELEY SUCTION TUBING (SUCTIONS) ×2 IMPLANT
SYR 20CC LL (SYRINGE) ×2 IMPLANT
TOWEL OR 17X24 6PK STRL BLUE (TOWEL DISPOSABLE) ×4 IMPLANT
VACURETTE 8 RIGID CVD (CANNULA) ×2 IMPLANT

## 2011-10-27 NOTE — Anesthesia Postprocedure Evaluation (Signed)
Anesthesia Post Note  Patient: [name]  Procedure(s) Performed: Procedure(s) (LRB): DILATATION AND EVACUATION (N/A)  Anesthesia type: MAC  Patient location: PACU  Post pain: Pain level controlled  Post assessment: Post-op Vital signs reviewed  Last Vitals:  Filed Vitals:   10/27/11 0930  BP:   Pulse: 71  Temp:   Resp: 16    Post vital signs: Reviewed  Level of consciousness: sedated  Complications: No apparent anesthesia complications

## 2011-10-27 NOTE — Anesthesia Preprocedure Evaluation (Signed)
Anesthesia Evaluation  Patient identified by MRN, date of birth, ID band Patient awake    Reviewed: Allergy & Precautions, H&P , Patient's Chart, lab work & pertinent test results, reviewed documented beta blocker date and time   History of Anesthesia Complications Negative for: history of anesthetic complications  Airway Mallampati: II TM Distance: >3 FB Neck ROM: full    Dental No notable dental hx.    Pulmonary neg pulmonary ROS,  breath sounds clear to auscultation  Pulmonary exam normal       Cardiovascular Exercise Tolerance: Good hypertension, negative cardio ROS  Rhythm:regular Rate:Normal     Neuro/Psych  Headaches, negative neurological ROS  negative psych ROS   GI/Hepatic negative GI ROS, Neg liver ROS,   Endo/Other  negative endocrine ROS  Renal/GU negative Renal ROS     Musculoskeletal   Abdominal   Peds  Hematology negative hematology ROS (+)   Anesthesia Other Findings Hypertension     Ovarian cyst        Abnormal Pap smear   colpo, ok since Chlamydia        Headache   OTC meds prn    Reproductive/Obstetrics negative OB ROS                           Anesthesia Physical Anesthesia Plan  ASA: II  Anesthesia Plan: MAC   Post-op Pain Management:    Induction:   Airway Management Planned:   Additional Equipment:   Intra-op Plan:   Post-operative Plan:   Informed Consent: I have reviewed the patients History and Physical, chart, labs and discussed the procedure including the risks, benefits and alternatives for the proposed anesthesia with the patient or authorized representative who has indicated his/her understanding and acceptance.   Dental Advisory Given  Plan Discussed with: CRNA, Surgeon and Anesthesiologist  Anesthesia Plan Comments:         Anesthesia Quick Evaluation

## 2011-10-27 NOTE — Transfer of Care (Signed)
Immediate Anesthesia Transfer of Care Note  Patient: [name]  Procedure(s) Performed: Procedure(s) (LRB) with comments: DILATATION AND EVACUATION (N/A)  Patient Location: PACU  Anesthesia Type: MAC  Level of Consciousness: awake, alert  and oriented  Airway & Oxygen Therapy: Patient Spontanous Breathing  Post-op Assessment: Report given to PACU RN and Post -op Vital signs reviewed and stable  Post vital signs: stable  Complications: No apparent anesthesia complications

## 2011-10-27 NOTE — H&P (Signed)
History        CSN: [id number]   Arrival date and time: 10/25/11 1710    None         Chief Complaint   Patient presents with   .  Pain with urination       HPI Pt presents c/o cramping x 2days and spotting earlier today.  She had u/s with IUP a few wks ago in MAU seen by teaching service.  Pt is currently scheduled in office at [facility] on 9/9.  She says she has routine n/v and is able to keep some food down.  Also c/o pelvic pain in general and with urination.    OB History      Grav  Para  Term  Preterm  Abortions  TAB  SAB  Ect  Mult  Living     4  3  3   0  0  0  0  0  0  3            Past Medical History   Diagnosis  Date   .  Hypertension (reports no meds)     .  Ovarian cyst     .  Headache     .  Abnormal Pap smear         colpo, ok since   .  Chlamydia           Past Surgical History   Procedure  Date   .  Cesarean section x 2     .  Oophrectomy  2007       Right , d/t cyst         Family History   Problem  Relation  Age of Onset   .  Hypertension  Mother     .  Hypertension  Father     .  Other  Neg Hx           History   Substance Use Topics   .  Smoking status:  Former Smoker       Quit date:  09/14/2011   .  Smokeless tobacco:  Never Used     Comment: quit with pos preg   .  Alcohol Use:  No        Allergies: No Known Allergies    Prescriptions prior to admission   Medication  Sig  Dispense  Refill   .  acetaminophen (TYLENOL) 500 MG tablet  Take 500 mg by mouth every 6 (six) hours as needed. headache              ROS Noncontributory    Physical Exam      Blood pressure 128/72, pulse 100, temperature 98.1 F (36.7 C), temperature source Oral, resp. rate 16, last menstrual period 08/17/2011.   Physical Exam Spec no active bleeding, brown d/c noted Cervix closed and long +tenderness in suprapubic region but no CMT      MAU Course    Procedures   CBC CMET U/S Wet Prep GC/CT      Assessment  and Plan    25yo at 10 2/7wks with spotting and cramping.  Currently appear stable with a little nausea and some pelvic pressure.  Will check u/s and labs.  Will give a dose of phenergan.  Once labs and u/s return will determine disposition.   ROBERTS,ANGELA Y 10/25/2011, 6:42 PM    S:  Pt reports d/c is now yellow; intermittent lower abdominal cramping.  Pt's  sister is at bedside.   O:  .Marland Kitchen Filed Vitals:     10/25/11 1741   BP:  128/72   Pulse:  100   Temp:  98.1 F (36.7 C)   TempSrc:  Oral   Resp:  16    .. Results for orders placed during the hospital encounter of 10/25/11 (from the past 24 hour(s))   URINALYSIS, ROUTINE W REFLEX MICROSCOPIC     Status: Abnormal     Collection Time     10/25/11  5:35 PM       Component  Value  Range     Color, Urine  YELLOW   YELLOW     APPearance  HAZY (*)  CLEAR     Specific Gravity, Urine  >1.030 (*)  1.005 - 1.030     pH  6.0   5.0 - 8.0     Glucose, UA  NEGATIVE   NEGATIVE mg/dL     Hgb urine dipstick  LARGE (*)  NEGATIVE     Bilirubin Urine  NEGATIVE   NEGATIVE     Ketones, ur  15 (*)  NEGATIVE mg/dL     Protein, ur  NEGATIVE   NEGATIVE mg/dL     Urobilinogen, UA  1.0   0.0 - 1.0 mg/dL     Nitrite  NEGATIVE   NEGATIVE     Leukocytes, UA  NEGATIVE   NEGATIVE   URINE MICROSCOPIC-ADD ON     Status: Abnormal     Collection Time     10/25/11  5:35 PM       Component  Value  Range     Squamous Epithelial / LPF  FEW (*)  RARE     WBC, UA  0-2   <3 WBC/hpf     RBC / HPF  11-20   <3 RBC/hpf   WET PREP, GENITAL     Status: Abnormal     Collection Time     10/25/11  6:40 PM       Component  Value  Range     Yeast Wet Prep HPF POC  NONE SEEN   NONE SEEN     Trich, Wet Prep  NONE SEEN   NONE SEEN     Clue Cells Wet Prep HPF POC  NONE SEEN   NONE SEEN     WBC, Wet Prep HPF POC  FEW (*)  NONE SEEN   CBC     Status: Abnormal     Collection Time     10/25/11  6:45 PM       Component  Value  Range     WBC  8.7   4.0 - 10.5 K/uL     RBC  4.46    3.87 - 5.11 MIL/uL     Hemoglobin  11.6 (*)  12.0 - 15.0 g/dL     HCT  16.1 (*)  09.6 - 46.0 %     MCV  79.4   78.0 - 100.0 fL     MCH  26.0   26.0 - 34.0 pg     MCHC  32.8   30.0 - 36.0 g/dL     RDW  04.5   40.9 - 15.5 %     Platelets  239   150 - 400 K/uL   COMPREHENSIVE METABOLIC PANEL     Status: Abnormal     Collection Time     10/25/11  6:45 PM       Component  Value  Range     Sodium  134 (*)  135 - 145 mEq/L     Potassium  4.1   3.5 - 5.1 mEq/L     Chloride  104   96 - 112 mEq/L     CO2  20   19 - 32 mEq/L     Glucose, Bld  92   70 - 99 mg/dL     BUN  13   6 - 23 mg/dL     Creatinine, Ser  4.69   0.50 - 1.10 mg/dL     Calcium  9.1   8.4 - 10.5 mg/dL     Total Protein  7.1   6.0 - 8.3 g/dL     Albumin  3.3 (*)  3.5 - 5.2 g/dL     AST  20   0 - 37 U/L     ALT  6   0 - 35 U/L     Alkaline Phosphatase  61   39 - 117 U/L     Total Bilirubin  0.1 (*)  0.3 - 1.2 mg/dL     GFR calc non Af Amer  >90   >90 mL/min     GFR calc Af Amer  >90   >90 mL/min    *RADIOLOGY REPORT*   Clinical Data: Spotting.   OBSTETRIC <14 WK ULTRASOUND   Technique: Transabdominal ultrasound was performed for evaluation   of the gestation as well as the maternal uterus and adnexal   regions.   Comparison: 10/02/2011.   Intrauterine gestational sac: Visualized   Yolk sac: Calcified   Embryo: Visualized   Cardiac Activity: No heart activity.   CRL: 9.7 mm mm 7 w 1 d   Maternal uterus/Adnexae:   Right ovary surgically absent. Left ovary with corpus luteum cyst   measuring up to 2.2 cm.   IMPRESSION:   Nonviable intrauterine gestation. No cardiac activity noted. No   interval growth.   Result discussed with Darl Pikes ultrasound technologist and to be   called to MAU.   Original Report Authenticated By: Fuller Canada, M.D.   A:  1.  MAB at [redacted]w[redacted]d       2.  U/s showing no interval growth since 10/02/11 u/s and no FHR      3.  O pos      4. H/o [facility] on last u/s      5.  Urine suspicious for  UTI   P:  1.  Pt very upset after disc'd u/s report and tearful.  Given options of exp management, induction w/ cytotec, or D&E, and pt w/ difficulty processing all information w/ unexpected news of IUFD. 2.  Pt desires to discuss further at home and I rev'd bleeding and SAB precautions and d/c'd pt home and will have office call pt tomorrow to check on her and see if she has decided of future POC. 3.  Dr. Su Hilt notified. 4.  Urine for c&s 5.  Support given; f/u prn C. Denny Levy, [place] 10/25/11; 2120  Date of Initial H&P: 10/25/11 Pt decided on surgery.  She understands the risks are bleeding, infection, retained POC, perforation and damage to internal organs  History reviewed, patient examined, no change in status, stable for surgery.

## 2011-10-27 NOTE — Op Note (Signed)
Pre op DX :  missed abortion Postoperative Diagnosis: same Procedure:  dilation and evacuation Anesthesia:  MAC and local Surgeon:  Dr. Normand Sloop Asst : none Complications : none Procedure in detail: The patient was taken to the operating room where she was given MAC anesthesia. She was placed in dorsal lithotomy position and prepped and draped in normal sterile fashion. In and out catheter was used to drain the bladder. This was examined and noted to have a 9 week size uterus with no adnexal masses. A bivalve was placed into the vagina. A tenaculum was placed on the cervix. The cervix was infiltrated with 20 cc 2% lidocaine paracervical block. The cervix then dilated with dilators up to 21.  A size 8 suction curettage was placed into the uterine cavity. A scant amount of products of conception was seen. The suction curettage was removed when a gritty texture was noted. A sharp curette was done along all walls  of the uterus. The suction curet was placed back into the uterine cavity. No further products of conception were obtained. Sponge lap and needle counts were correct. Patient back in stable condition. The patient understood to be the risks to be, but not  limited to,  Bleeding,  Infection,  damage to internal organs by perforation of the uterus and Asherman syndrome (scarring in the uterus) leading to infertility.

## 2011-10-28 ENCOUNTER — Encounter (HOSPITAL_COMMUNITY): Payer: Self-pay | Admitting: Obstetrics and Gynecology

## 2011-11-02 ENCOUNTER — Telehealth: Payer: Self-pay | Admitting: Obstetrics and Gynecology

## 2011-11-02 NOTE — Telephone Encounter (Signed)
Grief card and Heartstrings information mailed to patient for loss per ND. -Adrianne Pridgen

## 2011-11-11 ENCOUNTER — Encounter: Payer: Self-pay | Admitting: Obstetrics and Gynecology

## 2011-11-15 ENCOUNTER — Ambulatory Visit (INDEPENDENT_AMBULATORY_CARE_PROVIDER_SITE_OTHER): Payer: Medicaid Other | Admitting: Obstetrics and Gynecology

## 2011-11-15 VITALS — BP 140/98 | Ht 62.0 in | Wt 167.0 lb

## 2011-11-15 DIAGNOSIS — O021 Missed abortion: Secondary | ICD-10-CM

## 2011-11-15 NOTE — Progress Notes (Signed)
DATE OF SURGERY: 10/27/11 TYPE OF SURGERY:D&E PAIN:Yes cramps VAG BLEEDING: yes VAG DISCHARGE: no NORMAL GI FUNCTN: yes NORMAL GU FUNCTN: yes  FINAL for ROANNE, HAYE ([id number]) Patient Name: DEONE, OMAHONEY Accession #: [id number] DOB: 01-Jun-1985 Age: 26 Gender: F Client Name [facility] Collected Date: 10/27/2011 Received Date: 10/27/2011 Physician: Samule Ohm Dillard Chart #: MRN # : [id number] Physician cc: Race: B Visit #: [id number] REPORT OF SURGICAL PATHOLOGY FINAL DIAGNOSIS Diagnosis Products of Conception - CHORIONIC VILLI, CONSISTENT WITH PRODUCTS OF CONCEPTION. Jimmy Picket MD Pathologist, Electronic Signature (Case signed 10/28/2011) Specimen Gross and Clinical Information Specimen(s) Obtained: Products of Conception Specimen Clinical Information missed abortion, [redacted]w[redacted]d [jl] Gross Received in formalin within a tissue trap is a 6.6 x 5.6 x 1.4 cm aggregate of soft to hemorrhagic tissue which includes membranous and papillary tissue, grossly consistent with chorionic villi. Fetal structures are not identified. A portion is submitted in one block. (TB:eps 10/27/11) Report signed out from the following location(s) [facility] [address], , [place] [address]. CLIA #: [id number], 1 of 1

## 2011-11-25 ENCOUNTER — Encounter: Payer: Self-pay | Admitting: Obstetrics and Gynecology

## 2011-11-25 ENCOUNTER — Ambulatory Visit (INDEPENDENT_AMBULATORY_CARE_PROVIDER_SITE_OTHER): Payer: Medicaid Other | Admitting: Obstetrics and Gynecology

## 2011-11-25 VITALS — BP 126/90 | Temp 99.0°F | Wt 166.0 lb

## 2011-11-25 DIAGNOSIS — O021 Missed abortion: Secondary | ICD-10-CM

## 2011-11-25 NOTE — Patient Instructions (Signed)
Contraception Choices  Birth control (contraception) can stop pregnancy from happening. Different types of birth control work in different ways. Some can:  · Make the mucus in the cervix thick. This makes it hard for sperm to get into the uterus.  · Thin the lining of the uterus. This makes it hard for an egg to attach to the wall of the uterus.  · Stop the ovaries from releasing an egg.  · Block the sperm from reaching the egg.  Certain types of surgery can stop pregnancy from happening. For women, the sugery closes the fallopian tubes (tubal ligation). For men, the surgery stops sperm from releasing during sex (vasectomy).  HORMONAL BIRTH CONTROL  Hormonal birth control stops pregnancy by putting hormones into your body. Types of birth control include:  · A small tube put under the skin of the upper arm (implant). The tube can stay in place for 3 years.  · Shots given every 3 months.  · Pills taken every day or once after sex (intercourse).  · Patches that are changed once a week.  · A ring put into the vagina (vaginal ring). The ring is left in place for 3 weeks and removed for 1 week. Then, a new ring is put in the vagina.  BARRIER BIRTH CONTROL   Barrier birth control blocks sperm from reaching the egg. Types of birth control include:   · A thin covering worn on the penis (female condom) during sex.  · A soft, loose covering put into the vagina (female condom) before sex.  · A rubber bowl that sits over the cervix (diaphragm). The bowl must be made for you. The bowl is put into the vagina before sex. The bowl is left in place for 6 to 8 hours after sex.  · A small, soft cup that fits over the cervix (cervical cap). The cup must be made for you. The cup can be left in place for 48 hours after sex.  · A sponge that is put into the vagina before sex.  · A chemical that kills or blocks sperm from getting into the cervix and uterus (spermicide). The chemical may be a cream, jelly, foam, or pill.  INTRAUTERINE (IUD)  BIRTH CONTROL   IUD birth control is a small, T-shaped piece of plastic. The plastic is put inside the uterus. There are 2 types of IUD:  · Copper IUD. The IUD is covered in copper wire. The copper makes a fluid that kills sperm. It can stay in place for 10 years.  · Hormone IUD. The hormone stops pregnancy from happening. It can stay in place for 5 years.  NATURAL FAMILY PLANNING BIRTH CONTROL   Natural family planning means not having sex or using barrier birth control when the woman is fertile. A woman can:  · Use a calendar to keep track of when she is fertile.  · Use a thermometer to measure her body temperature.  Protect yourself against sexual diseases no matter what type of birth control you use. Talk to your doctor about which type of birth control is best for you.  Document Released: 12/05/2008 Document Revised: 05/02/2011 Document Reviewed: 06/16/2010  ExitCare® Patient Information ©2013 [employer].

## 2011-11-25 NOTE — Progress Notes (Signed)
DATE OF SURGERY: 10/27/11 TYPE OF SURGERY:D&E PAIN:Yes Pt c/o cramping VAG BLEEDING: yes VAG DISCHARGE: no NORMAL GI FUNCTN: yes NORMAL GU FUNCTN: yes Physical Examination: General appearance - alert, well appearing, and in no distress Chest - clear to auscultation, no wheezes, rales or rhonchi, symmetric air entry Heart - normal rate and regular rhythm Abdomen - soft, nontender, nondistended, no masses or organomegaly Pelvic - normal external genitalia, vulva, vagina, cervix, uterus and adnexa, mild vaginal bleedingc/w a menses S/p D&E Pt stable Pt smokes and wants to start micronor Instructions given to pt Rt 3 months for AEX

## 2011-12-02 ENCOUNTER — Other Ambulatory Visit: Payer: Self-pay | Admitting: Obstetrics and Gynecology

## 2011-12-02 MED ORDER — NORETHINDRONE 0.35 MG PO TABS: 1.0000 | ORAL_TABLET | Freq: Every day | ORAL | Status: DC

## 2011-12-02 NOTE — Telephone Encounter (Signed)
Spoke with pt informed rx sent to pharm 

## 2011-12-02 NOTE — Telephone Encounter (Signed)
ND pt 

## 2011-12-04 ENCOUNTER — Encounter (HOSPITAL_COMMUNITY): Payer: Self-pay | Admitting: Anesthesiology

## 2011-12-04 ENCOUNTER — Inpatient Hospital Stay (HOSPITAL_COMMUNITY)
Admission: AD | Admit: 2011-12-04 | Discharge: 2011-12-04 | Disposition: A | Discharge status: Home / Self Care | Payer: Medicaid Other | Source: Ambulatory Visit | Attending: Obstetrics and Gynecology | Admitting: Obstetrics and Gynecology

## 2011-12-04 ENCOUNTER — Inpatient Hospital Stay (HOSPITAL_COMMUNITY): Payer: Medicaid Other

## 2011-12-04 ENCOUNTER — Encounter (HOSPITAL_COMMUNITY)
Admission: AD | Disposition: A | Discharge status: Home / Self Care | Payer: Self-pay | Source: Ambulatory Visit | Attending: Obstetrics and Gynecology

## 2011-12-04 ENCOUNTER — Encounter (HOSPITAL_COMMUNITY): Payer: Self-pay | Admitting: *Deleted

## 2011-12-04 ENCOUNTER — Inpatient Hospital Stay (HOSPITAL_COMMUNITY): Payer: Medicaid Other | Admitting: Anesthesiology

## 2011-12-04 ENCOUNTER — Ambulatory Visit: Admit: 2011-12-04 | Payer: Self-pay | Admitting: Obstetrics and Gynecology

## 2011-12-04 DIAGNOSIS — O034 Incomplete spontaneous abortion without complication: Secondary | ICD-10-CM

## 2011-12-04 DIAGNOSIS — IMO0002 Reserved for concepts with insufficient information to code with codable children: Secondary | ICD-10-CM | POA: Insufficient documentation

## 2011-12-04 HISTORY — PX: DILATION AND EVACUATION: SHX1459

## 2011-12-04 LAB — CBC
HCT: 30.9 % — ABNORMAL LOW (ref 36.0–46.0)
Hemoglobin: 10.1 g/dL — ABNORMAL LOW (ref 12.0–15.0)
MCH: 26.5 pg (ref 26.0–34.0)
MCHC: 32.7 g/dL (ref 30.0–36.0)
RBC: 3.81 MIL/uL — ABNORMAL LOW (ref 3.87–5.11)

## 2011-12-04 SURGERY — DILATION AND EVACUATION, UTERUS
Anesthesia: Monitor Anesthesia Care | Site: Uterus | Wound class: Clean Contaminated

## 2011-12-04 MED ORDER — IBUPROFEN 600 MG PO TABS: 600.0000 mg | ORAL_TABLET | Freq: Four times a day (QID) | ORAL | Status: DC | PRN

## 2011-12-04 MED ORDER — DEXAMETHASONE SODIUM PHOSPHATE 4 MG/ML IJ SOLN
INTRAMUSCULAR | Status: DC | PRN
  Administered 2011-12-04: 10 mg via INTRAVENOUS

## 2011-12-04 MED ORDER — LIDOCAINE HCL 2 % IJ SOLN
INTRAMUSCULAR | Status: DC | PRN
  Administered 2011-12-04: 20 mL

## 2011-12-04 MED ORDER — FENTANYL CITRATE 0.05 MG/ML IJ SOLN
INTRAMUSCULAR | Status: DC | PRN
  Administered 2011-12-04 (×2): 50 ug via INTRAVENOUS

## 2011-12-04 MED ORDER — SODIUM CHLORIDE 0.9 % IR SOLN
Status: DC | PRN
  Administered 2011-12-04: 1000 mL

## 2011-12-04 MED ORDER — DOXYCYCLINE HYCLATE 50 MG PO CAPS: 100.0000 mg | ORAL_CAPSULE | Freq: Two times a day (BID) | ORAL | Status: AC

## 2011-12-04 MED ORDER — ONDANSETRON HCL 4 MG/2ML IJ SOLN
INTRAMUSCULAR | Status: DC | PRN
  Administered 2011-12-04: 4 mg via INTRAVENOUS

## 2011-12-04 MED ORDER — FENTANYL CITRATE 0.05 MG/ML IJ SOLN
25.0000 ug | INTRAMUSCULAR | Status: DC | PRN
  Administered 2011-12-04: 50 ug via INTRAVENOUS

## 2011-12-04 MED ORDER — PROPOFOL 10 MG/ML IV EMUL
INTRAVENOUS | Status: DC | PRN
  Administered 2011-12-04 (×7): 20 mg via INTRAVENOUS

## 2011-12-04 MED ORDER — LIDOCAINE HCL (CARDIAC) 20 MG/ML IV SOLN
INTRAVENOUS | Status: DC | PRN
  Administered 2011-12-04: 20 mg via INTRAVENOUS

## 2011-12-04 MED ORDER — KETOROLAC TROMETHAMINE 30 MG/ML IJ SOLN
INTRAMUSCULAR | Status: DC | PRN
  Administered 2011-12-04: 30 mg via INTRAVENOUS

## 2011-12-04 MED ORDER — GLYCOPYRROLATE 0.2 MG/ML IJ SOLN
INTRAMUSCULAR | Status: DC | PRN
  Administered 2011-12-04: 0.2 mg via INTRAVENOUS

## 2011-12-04 MED ORDER — FAMOTIDINE IN NACL 20-0.9 MG/50ML-% IV SOLN
20.0000 mg | Freq: Once | INTRAVENOUS | Status: AC
  Administered 2011-12-04: 20 mg via INTRAVENOUS
  Filled 2011-12-04: qty 50

## 2011-12-04 MED ORDER — MIDAZOLAM HCL 5 MG/5ML IJ SOLN
INTRAMUSCULAR | Status: DC | PRN
  Administered 2011-12-04: 2 mg via INTRAVENOUS

## 2011-12-04 MED ORDER — LACTATED RINGERS IV SOLN
INTRAVENOUS | Status: DC
  Administered 2011-12-04: 14:00:00 via INTRAVENOUS

## 2011-12-04 MED ORDER — METHYLERGONOVINE MALEATE 0.2 MG PO TABS
0.2000 mg | ORAL_TABLET | Freq: Four times a day (QID) | ORAL | Status: DC
Start: 2011-12-04 — End: 2012-01-12

## 2011-12-04 SURGICAL SUPPLY — 21 items
CATH ROBINSON RED A/P 16FR (CATHETERS) ×2 IMPLANT
CLOTH BEACON ORANGE TIMEOUT ST (SAFETY) ×2 IMPLANT
DECANTER SPIKE VIAL GLASS SM (MISCELLANEOUS) ×2 IMPLANT
DRESSING TELFA 8X3 (GAUZE/BANDAGES/DRESSINGS) IMPLANT
GLOVE BIOGEL PI IND STRL 7.0 (GLOVE) ×1 IMPLANT
GLOVE BIOGEL PI INDICATOR 7.0 (GLOVE) ×1
GOWN STRL REIN XL XLG (GOWN DISPOSABLE) ×2 IMPLANT
KIT BERKELEY 1ST TRIMESTER 3/8 (MISCELLANEOUS) ×2 IMPLANT
NEEDLE SPNL 22GX3.5 QUINCKE BK (NEEDLE) ×2 IMPLANT
NS IRRIG 1000ML POUR BTL (IV SOLUTION) ×2 IMPLANT
PACK VAGINAL MINOR WOMEN LF (CUSTOM PROCEDURE TRAY) ×2 IMPLANT
PAD OB MATERNITY 4.3X12.25 (PERSONAL CARE ITEMS) ×2 IMPLANT
PAD PREP 24X48 CUFFED NSTRL (MISCELLANEOUS) ×2 IMPLANT
SET BERKELEY SUCTION TUBING (SUCTIONS) ×2 IMPLANT
SYR 20CC LL (SYRINGE) ×2 IMPLANT
TOWEL OR 17X24 6PK STRL BLUE (TOWEL DISPOSABLE) ×4 IMPLANT
VACURETTE 10 RIGID CVD (CANNULA) IMPLANT
VACURETTE 7MM CVD STRL WRAP (CANNULA) IMPLANT
VACURETTE 8 RIGID CVD (CANNULA) ×2 IMPLANT
VACURETTE 9 RIGID CVD (CANNULA) IMPLANT
WATER STERILE IRR 1000ML POUR (IV SOLUTION) IMPLANT

## 2011-12-04 NOTE — Transfer of Care (Signed)
Immediate Anesthesia Transfer of Care Note  Patient: [name]  Procedure(s) Performed: Procedure(s) (LRB) with comments: DILATATION AND EVACUATION (N/A)  Patient Location: PACU  Anesthesia Type: MAC  Level of Consciousness: awake, alert  and oriented  Airway & Oxygen Therapy: Patient Spontanous Breathing and Patient connected to nasal cannula oxygen  Post-op Assessment: Report given to PACU RN and Post -op Vital signs reviewed and stable  Post vital signs: Reviewed and stable  Complications: No apparent anesthesia complications

## 2011-12-04 NOTE — H&P (Signed)
Pt presents with vaginal bleeding.  She has been bleeding since D&E 9/20.  She states she is now passing clots.   Pt had an [place] this am which demonstrates retained POC.  We plan to go to OR for repeat D&E under US guidance Past Medical History  Diagnosis Date  . Hypertension   . Ovarian cyst   . Abnormal Pap smear     colpo, ok since  . Chlamydia   . Headache     OTC meds prn  . Lower back pain   . Leg pain    Past Surgical History  Procedure Date  . Cesarean section     x 2  . Oophrectomy 2007    Right , d/t cyst  . Diagnostic laparoscopy 2007  . Dilation and evacuation 10/27/2011    Procedure: DILATATION AND EVACUATION;  Surgeon: Michael Litter, MD;  Location: [facility] ORS;  Service: Gynecology;  Laterality: N/A;  NKDA MEDS none Family History  Problem Relation Age of Onset  . Hypertension Mother   . Hypertension Father   . Other Neg Hx   . Diabetes Paternal Aunt   . Hypertension Maternal Grandmother   . Cancer Maternal Grandmother   . Hypertension Maternal Grandfather   . Hypertension Paternal Grandmother   . Hypertension Paternal Grandfather   . Mental retardation Maternal Uncle    History   Social History  . Marital Status: Single    Spouse Name: N/A    Number of Children: N/A  . Years of Education: N/A   Occupational History  . Not on file.   Social History Main Topics  . Smoking status: Current Some Day Smoker -- 1.0 packs/day for 4 years    Types: Cigarettes    Last Attempt to Quit: 09/14/2011  . Smokeless tobacco: Never Used   Comment: quit with pos preg  . Alcohol Use: No  . Drug Use: No  . Sexually Active: Not on file   Other Topics Concern  . Not on file   Social History Narrative  . No narrative on file   Pt smokes tobacco BP 137/89  Pulse 99  Temp 98.5 F (36.9 C) (Oral)  Resp 20  LMP 08/17/2011 Physical Examination: General appearance - alert, well appearing, and in no distress Chest - clear to auscultation, no wheezes, rales or rhonchi,  symmetric air entry Heart - normal rate and regular rhythm Abdomen - soft, nontender, nondistended, no masses or organomegaly Pelvic - normal external genitalia, vulva, vagina, cervix, uterus and adnexa, cx is closed with minimal vaginal bleeding Extremities - peripheral pulses normal, no pedal edema, no clubbing or cyanosis A/P Retained POC after D&E Pt offered methergine vs D&E Pt chose D&E.  Will do with US guidance.  Risks of bleeding, infection, damage to internal organs through perforation discussed with the pt.  She voiced understanding

## 2011-12-04 NOTE — Anesthesia Preprocedure Evaluation (Signed)
Anesthesia Evaluation  Patient identified by MRN, date of birth, ID band Patient awake    Reviewed: Allergy & Precautions, H&P , NPO status , Patient's Chart, lab work & pertinent test results  Airway Mallampati: III TM Distance: >3 FB Neck ROM: Full    Dental No notable dental hx. (+) Teeth Intact   Pulmonary neg pulmonary ROS,  breath sounds clear to auscultation  Pulmonary exam normal       Cardiovascular negative cardio ROS  Rhythm:Regular Rate:Normal     Neuro/Psych  Headaches, negative psych ROS   GI/Hepatic negative GI ROS, Neg liver ROS,   Endo/Other  negative endocrine ROSMorbid obesity  Renal/GU negative Renal ROS  negative genitourinary   Musculoskeletal negative musculoskeletal ROS (+)   Abdominal (+) + obese,   Peds  Hematology negative hematology ROS (+)   Anesthesia Other Findings   Reproductive/Obstetrics (+) Pregnancy Missed Ab                           Anesthesia Physical Anesthesia Plan  ASA: II and Emergent  Anesthesia Plan: MAC   Post-op Pain Management:    Induction: Intravenous  Airway Management Planned: Natural Airway  Additional Equipment:   Intra-op Plan:   Post-operative Plan:   Informed Consent: I have reviewed the patients History and Physical, chart, labs and discussed the procedure including the risks, benefits and alternatives for the proposed anesthesia with the patient or authorized representative who has indicated his/her understanding and acceptance.   Dental advisory given  Plan Discussed with: CRNA, Anesthesiologist and Surgeon  Anesthesia Plan Comments:         Anesthesia Quick Evaluation

## 2011-12-04 NOTE — MAU Note (Signed)
Pt had d&c last month after a miscarriage. Reports she has had vaginal bleeding since then that has gotten heavier and heavier. Passing large clots.

## 2011-12-04 NOTE — Op Note (Signed)
Pre op DX :  Retained POC  Postoperative Diagnosis: same Procedure:  dilation and evacuation Anesthesia:  MAC and local Surgeon:  Dr. Normand Sloop Asst : none Complications : none Procedure in detail: The patient was taken to the operating room where she was given MAC anesthesia. She was placed in dorsal lithotomy position and prepped and draped in normal sterile fashion. In and out catheter was used to drain the bladder. This was examined and noted to have a 10 week size uterus with no adnexal masses. A bivalve was placed into the vagina. A tenaculum was placed on the cervix. The cervix was infiltrated with 20 cc 2% lidocaine paracervical block. The cervix then dilated with dilators up to 21.  A size 8 suction curettage was placed into the uterine cavity. A scant amount of products of conception was seen. The suction curettage was removed when a gritty texture was noted. A sharp curette was done along all walls  of the uterus. The suction curet was placed back into the uterine cavity. No further products of conception were obtained. Sponge lap and needle counts were correct.  On [place] there were no more products seen.  The Patient went  Back to recovery in stable condition. The patient understood to be the risks to be, but not  limited to,  Bleeding,  Infection,  damage to internal organs by perforation of the uterus and Asherman syndrome (scarring in the uterus) leading to infertility.

## 2011-12-04 NOTE — Anesthesia Postprocedure Evaluation (Signed)
  Anesthesia Post-op Note  Patient: [name]  Procedure(s) Performed: Procedure(s) (LRB) with comments: DILATATION AND EVACUATION (N/A)  Patient Location: PACU  Anesthesia Type: MAC  Level of Consciousness: awake, alert  and oriented  Airway and Oxygen Therapy: Patient Spontanous Breathing  Post-op Pain: none  Post-op Assessment: Post-op Vital signs reviewed, Patient's Cardiovascular Status Stable, Respiratory Function Stable, Patent Airway, No signs of Nausea or vomiting and Pain level controlled  Post-op Vital Signs: Reviewed and stable  Complications: No apparent anesthesia complications

## 2011-12-05 ENCOUNTER — Encounter (HOSPITAL_COMMUNITY): Payer: Self-pay | Admitting: Obstetrics and Gynecology

## 2011-12-21 ENCOUNTER — Ambulatory Visit (INDEPENDENT_AMBULATORY_CARE_PROVIDER_SITE_OTHER): Payer: Medicaid Other | Admitting: Obstetrics and Gynecology

## 2011-12-21 ENCOUNTER — Encounter: Payer: Self-pay | Admitting: Obstetrics and Gynecology

## 2011-12-21 VITALS — BP 120/80 | Temp 98.5°F | Ht 62.0 in | Wt 164.0 lb

## 2011-12-21 DIAGNOSIS — N898 Other specified noninflammatory disorders of vagina: Secondary | ICD-10-CM

## 2011-12-21 DIAGNOSIS — B3731 Acute candidiasis of vulva and vagina: Secondary | ICD-10-CM

## 2011-12-21 DIAGNOSIS — B373 Candidiasis of vulva and vagina: Secondary | ICD-10-CM

## 2011-12-21 LAB — POCT WET PREP (WET MOUNT)
Clue Cells Wet Prep Whiff POC: NEGATIVE
PH, VAGINAL: 5

## 2011-12-21 MED ORDER — FLUCONAZOLE 150 MG PO TABS: 150.0000 mg | ORAL_TABLET | Freq: Once | ORAL | Status: DC

## 2011-12-21 NOTE — Progress Notes (Signed)
DATE OF SURGERY: 12/04/11 TYPE OF SURGERY:D&C PAIN:No VAG BLEEDING: no VAG DISCHARGE: yes NORMAL GI FUNCTN: yes NORMAL GU FUNCTN: yes  FINAL for [name], [name] ([id number]) Patient Name: [name], [name] Accession #: [id number] DOB: 12-12-1985 Age: 26 Gender: F Client Name [facility] Collected Date: 12/04/2011 Received Date: 12/05/2011 Physician: Samule Ohm Allaina Brotzman Chart #: MRN # : [id number] Physician cc: Race: B Visit #: [id number] REPORT OF SURGICAL PATHOLOGY FINAL DIAGNOSIS Diagnosis Products of Conception - CHORIONIC VILLI (PRODUCTS OF CONCEPTION). [place] RUND DO Pathologist, Electronic Signature (Case signed 12/06/2011) Specimen Gross and Clinical Information Specimen(s) Obtained: Products of Conception Specimen Clinical Information missed abortion (je) Gross Received in formalin in a tissue trap is a 4.5 x 3.7 x 1.7 cm aggregate of soft hemorrhagic tissue. Chorionic villi are not definitively identified. The specimen is entirely submitted in six blocks. (TB:gt, 12/05/11) Report signed out from the following location(s) [facility] [address] [place], [place], [place] [address]. CLIA #: [id number], [facility] [address], [place] BP 120/80  Temp 98.5 F (36.9 C)  Ht 5\' 2"  (1.575 m)  Wt 164 lb (74.39 kg)  BMI 30.00 kg/m2  LMP 08/17/2011 Physical Examination: Heart - normal rate and regular rhythm Pelvic - normal external genitalia, vulva, vagina, cervix, uterus and adnexa, WET MOUNT done - results: monilia, vaginal pH is 5 Extremities - peripheral pulses normal, no pedal edema, no clubbing or cyanosis S/p D&E for retained products Yeast vaginitis diflucan rx given to pt Rt 1 month for AEX Heartstrings referral made

## 2012-01-12 ENCOUNTER — Emergency Department (HOSPITAL_COMMUNITY)
Admission: EM | Admit: 2012-01-12 | Discharge: 2012-01-12 | Disposition: A | Discharge status: Home / Self Care | Payer: Medicaid Other | Attending: Emergency Medicine | Admitting: Emergency Medicine

## 2012-01-12 ENCOUNTER — Encounter (HOSPITAL_COMMUNITY): Payer: Self-pay | Admitting: *Deleted

## 2012-01-12 DIAGNOSIS — N3090 Cystitis, unspecified without hematuria: Secondary | ICD-10-CM | POA: Insufficient documentation

## 2012-01-12 DIAGNOSIS — I10 Essential (primary) hypertension: Secondary | ICD-10-CM | POA: Insufficient documentation

## 2012-01-12 DIAGNOSIS — Z8742 Personal history of other diseases of the female genital tract: Secondary | ICD-10-CM | POA: Insufficient documentation

## 2012-01-12 DIAGNOSIS — R300 Dysuria: Secondary | ICD-10-CM | POA: Insufficient documentation

## 2012-01-12 LAB — URINALYSIS, ROUTINE W REFLEX MICROSCOPIC
Bilirubin Urine: NEGATIVE
Ketones, ur: NEGATIVE mg/dL
Nitrite: NEGATIVE
Protein, ur: 100 mg/dL — AB
Urobilinogen, UA: 1 mg/dL (ref 0.0–1.0)

## 2012-01-12 LAB — URINE MICROSCOPIC-ADD ON

## 2012-01-12 MED ORDER — PHENAZOPYRIDINE HCL 100 MG PO TABS
200.0000 mg | ORAL_TABLET | Freq: Once | ORAL | Status: AC
  Administered 2012-01-12: 200 mg via ORAL
  Filled 2012-01-12: qty 2

## 2012-01-12 MED ORDER — SULFAMETHOXAZOLE-TMP DS 800-160 MG PO TABS
1.0000 | ORAL_TABLET | Freq: Once | ORAL | Status: AC
  Administered 2012-01-12: 1 via ORAL
  Filled 2012-01-12: qty 1

## 2012-01-12 MED ORDER — SULFAMETHOXAZOLE-TRIMETHOPRIM 800-160 MG PO TABS: 1.0000 | ORAL_TABLET | Freq: Two times a day (BID) | ORAL | Status: DC

## 2012-01-12 MED ORDER — PHENAZOPYRIDINE HCL 100 MG PO TABS: 100.0000 mg | ORAL_TABLET | Freq: Three times a day (TID) | ORAL | Status: DC | PRN

## 2012-01-12 NOTE — ED Provider Notes (Signed)
History     CSN: [id number]  Arrival date & time 01/12/12  1037   First MD Initiated Contact with Patient 01/12/12 1126      Chief Complaint  Patient presents with  . Hematuria  . Dysuria    (Consider location/radiation/quality/duration/timing/severity/associated sxs/prior treatment) HPI  Past Medical History  Diagnosis Date  . Hypertension   . Ovarian cyst   . Abnormal Pap smear     colpo, ok since  . Chlamydia   . Headache     OTC meds prn  . Lower back pain   . Leg pain     Past Surgical History  Procedure Date  . Cesarean section     x 2  . Oophrectomy 2007    Right , d/t cyst  . Diagnostic laparoscopy 2007  . Dilation and evacuation 10/27/2011    Procedure: DILATATION AND EVACUATION;  Surgeon: Michael Litter, MD;  Location: [facility];  Service: Gynecology;  Laterality: N/A;  . Dilation and evacuation 12/04/2011    Procedure: DILATATION AND EVACUATION;  Surgeon: Michael Litter, MD;  Location: [facility];  Service: Gynecology;  Laterality: N/A;    Family History  Problem Relation Age of Onset  . Hypertension Mother   . Hypertension Father   . Other Neg Hx   . Diabetes Paternal Aunt   . Hypertension Maternal Grandmother   . Cancer Maternal Grandmother   . Hypertension Maternal Grandfather   . Hypertension Paternal Grandmother   . Hypertension Paternal Grandfather   . Mental retardation Maternal Uncle     History  Substance Use Topics  . Smoking status: Current Some Day Smoker -- 1.0 packs/day for 4 years    Types: Cigarettes    Last Attempt to Quit: 09/14/2011  . Smokeless tobacco: Never Used     Comment: quit with pos preg  . Alcohol Use: No    OB History    Grav Para Term Preterm Abortions TAB SAB Ect Mult Living   4 3 3  0 1 0 1 0 0 3      Review of Systems  Allergies  Review of patient's allergies indicates no known allergies.  Home Medications   Current Outpatient Rx  Name  Route  Sig  Dispense  Refill  . NORETHINDRONE 0.35 MG PO  TABS   Oral   Take 1 tablet by mouth daily.           BP 146/93  Pulse 88  Temp 98.5 F (36.9 C) (Oral)  Resp 20  SpO2 100%  LMP 01/02/2012  Physical Exam  ED Course  Procedures (including critical care time)  Labs Reviewed  URINALYSIS, ROUTINE W REFLEX MICROSCOPIC - Abnormal; Notable for the following:    Color, Urine AMBER (*)  BIOCHEMICALS MAY BE AFFECTED BY COLOR   APPearance CLOUDY (*)     Hgb urine dipstick LARGE (*)     Protein, ur 100 (*)     Leukocytes, UA SMALL (*)     All other components within normal limits  URINE MICROSCOPIC-ADD ON - Abnormal; Notable for the following:    Bacteria, UA FEW (*)     All other components within normal limits  POCT PREGNANCY, URINE   No results found.   No diagnosis found.    MDM  Cystitis No signs pyelobephritis        Cheri Guppy, MD 01/12/12 1201

## 2012-01-12 NOTE — ED Provider Notes (Signed)
History  This chart was scribed for Cheri Guppy, MD by Ardeen Jourdain, ED Scribe. This patient was seen in room TR09C/TR09C and the patient's care was started at 1126.  CSN: [id number]  Arrival date & time 01/12/12  1037   First MD Initiated Contact with Patient 01/12/12 1126      Chief Complaint  Patient presents with  . Hematuria  . Dysuria    Patient is a 26 y.o. female presenting with hematuria and dysuria. The history is provided by the patient. No language interpreter was used.  Hematuria This is a new problem. The current episode started today. The problem has been gradually worsening since onset. Irritative symptoms include urgency. Associated symptoms include dysuria. Pertinent negatives include no abdominal pain or flank pain.  Dysuria  This is a new problem. There has been no fever. Associated symptoms include hematuria and urgency. Pertinent negatives include no flank pain.    [name] is a 26 y.o. female who presents to the Emergency Department complaining of hemeturia and dysuria that started this morning and have been gradually worsening. She has associated nausea and difficulty urinating. She denies fever, emesis, back pain, rash, and vaginal discharge. She has a h/o HTN, ovarian cyst and chlamydia. She is a current everyday smoker but denies alcohol use.    Past Medical History  Diagnosis Date  . Hypertension   . Ovarian cyst   . Abnormal Pap smear     colpo, ok since  . Chlamydia   . Headache     OTC meds prn  . Lower back pain   . Leg pain     Past Surgical History  Procedure Date  . Cesarean section     x 2  . Oophrectomy 2007    Right , d/t cyst  . Diagnostic laparoscopy 2007  . Dilation and evacuation 10/27/2011    Procedure: DILATATION AND EVACUATION;  Surgeon: Michael Litter, MD;  Location: [facility];  Service: Gynecology;  Laterality: N/A;  . Dilation and evacuation 12/04/2011    Procedure: DILATATION AND EVACUATION;  Surgeon:  Michael Litter, MD;  Location: [facility];  Service: Gynecology;  Laterality: N/A;    Family History  Problem Relation Age of Onset  . Hypertension Mother   . Hypertension Father   . Other Neg Hx   . Diabetes Paternal Aunt   . Hypertension Maternal Grandmother   . Cancer Maternal Grandmother   . Hypertension Maternal Grandfather   . Hypertension Paternal Grandmother   . Hypertension Paternal Grandfather   . Mental retardation Maternal Uncle     History  Substance Use Topics  . Smoking status: Current Some Day Smoker -- 1.0 packs/day for 4 years    Types: Cigarettes    Last Attempt to Quit: 09/14/2011  . Smokeless tobacco: Never Used     Comment: quit with pos preg  . Alcohol Use: No    OB History    Grav Para Term Preterm Abortions TAB SAB Ect Mult Living   4 3 3  0 1 0 1 0 0 3      Review of Systems  Gastrointestinal: Negative for abdominal pain.  Genitourinary: Positive for dysuria, urgency, hematuria and difficulty urinating. Negative for flank pain, vaginal discharge and vaginal pain.  All other systems reviewed and are negative.    Allergies  Review of patient's allergies indicates no known allergies.  Home Medications   Current Outpatient Rx  Name  Route  Sig  Dispense  Refill  .  NORETHINDRONE 0.35 MG PO TABS   Oral   Take 1 tablet by mouth daily.           Triage Vitals; BP 146/93  Pulse 88  Temp 98.5 F (36.9 C) (Oral)  Resp 20  SpO2 100%  LMP 01/02/2012  Physical Exam  Nursing note and vitals reviewed. Constitutional: She is oriented to person, place, and time. She appears well-developed and well-nourished. No distress.  HENT:  Head: Normocephalic and atraumatic.  Eyes: EOM are normal. Pupils are equal, round, and reactive to light.  Neck: Normal range of motion. Neck supple. No tracheal deviation present.  Cardiovascular: Normal rate, regular rhythm and normal heart sounds.   Pulmonary/Chest: Effort normal and breath sounds normal. No  respiratory distress.  Abdominal: Soft. Bowel sounds are normal. She exhibits no distension. There is tenderness.       Mild suprapubic tenderness, no CVA tenderness   Musculoskeletal: Normal range of motion. She exhibits no edema.  Neurological: She is alert and oriented to person, place, and time.  Skin: Skin is warm and dry.  Psychiatric: She has a normal mood and affect. Her behavior is normal.    ED Course  Procedures (including critical care time)  DIAGNOSTIC STUDIES: Oxygen Saturation is 100% on room air, normal by my interpretation.    COORDINATION OF CARE:  11:56 AM: Discussed treatment plan which includes antibiotics and pain medications with pt at bedside and pt agreed to plan.    Labs Reviewed  URINALYSIS, ROUTINE W REFLEX MICROSCOPIC - Abnormal; Notable for the following:    Color, Urine AMBER (*)  BIOCHEMICALS MAY BE AFFECTED BY COLOR   APPearance CLOUDY (*)     Hgb urine dipstick LARGE (*)     Protein, ur 100 (*)     Leukocytes, UA SMALL (*)     All other components within normal limits  URINE MICROSCOPIC-ADD ON - Abnormal; Notable for the following:    Bacteria, UA FEW (*)     All other components within normal limits  POCT PREGNANCY, URINE   No results found.   No diagnosis found.    MDM  cystitis      I personally performed the services described in this documentation, which was scribed in my presence. The recorded information has been reviewed and is accurate.    Cheri Guppy, MD 01/12/12 [phone]

## 2012-01-12 NOTE — ED Notes (Signed)
Pt in c/o hematuria and dysuria since this am, denies abd pain at this time

## 2012-01-12 NOTE — ED Notes (Signed)
Woke at 2am with dysuria and blood in urine. Denies vaginal discharge.

## 2012-01-13 LAB — URINE CULTURE: Colony Count: 80000

## 2012-01-25 ENCOUNTER — Encounter: Payer: Self-pay | Admitting: Obstetrics and Gynecology

## 2012-01-25 ENCOUNTER — Ambulatory Visit (INDEPENDENT_AMBULATORY_CARE_PROVIDER_SITE_OTHER): Payer: Medicaid Other | Admitting: Obstetrics and Gynecology

## 2012-01-25 VITALS — BP 118/68 | Ht 61.0 in | Wt 169.0 lb

## 2012-01-25 DIAGNOSIS — Z0000 Encounter for general adult medical examination without abnormal findings: Secondary | ICD-10-CM

## 2012-01-25 DIAGNOSIS — Z124 Encounter for screening for malignant neoplasm of cervix: Secondary | ICD-10-CM

## 2012-01-25 LAB — HIV ANTIBODY (ROUTINE TESTING W REFLEX): HIV: NONREACTIVE

## 2012-01-25 LAB — HEPATITIS B SURFACE ANTIGEN: Hepatitis B Surface Ag: NEGATIVE

## 2012-01-25 LAB — HEPATITIS C ANTIBODY: HCV Ab: NEGATIVE

## 2012-01-25 MED ORDER — NORETHINDRONE 0.35 MG PO TABS: 1.0000 | ORAL_TABLET | Freq: Every day | ORAL | Status: DC

## 2012-01-25 NOTE — Patient Instructions (Signed)
Contraception Choices  Birth control (contraception) can stop pregnancy from happening. Different types of birth control work in different ways. Some can:  · Make the mucus in the cervix thick. This makes it hard for sperm to get into the uterus.  · Thin the lining of the uterus. This makes it hard for an egg to attach to the wall of the uterus.  · Stop the ovaries from releasing an egg.  · Block the sperm from reaching the egg.  Certain types of surgery can stop pregnancy from happening. For women, the sugery closes the fallopian tubes (tubal ligation). For men, the surgery stops sperm from releasing during sex (vasectomy).  HORMONAL BIRTH CONTROL  Hormonal birth control stops pregnancy by putting hormones into your body. Types of birth control include:  · A small tube put under the skin of the upper arm (implant). The tube can stay in place for 3 years.  · Shots given every 3 months.  · Pills taken every day or once after sex (intercourse).  · Patches that are changed once a week.  · A ring put into the vagina (vaginal ring). The ring is left in place for 3 weeks and removed for 1 week. Then, a new ring is put in the vagina.  BARRIER BIRTH CONTROL   Barrier birth control blocks sperm from reaching the egg. Types of birth control include:   · A thin covering worn on the penis (female condom) during sex.  · A soft, loose covering put into the vagina (female condom) before sex.  · A rubber bowl that sits over the cervix (diaphragm). The bowl must be made for you. The bowl is put into the vagina before sex. The bowl is left in place for 6 to 8 hours after sex.  · A small, soft cup that fits over the cervix (cervical cap). The cup must be made for you. The cup can be left in place for 48 hours after sex.  · A sponge that is put into the vagina before sex.  · A chemical that kills or blocks sperm from getting into the cervix and uterus (spermicide). The chemical may be a cream, jelly, foam, or pill.  INTRAUTERINE (IUD)  BIRTH CONTROL   IUD birth control is a small, T-shaped piece of plastic. The plastic is put inside the uterus. There are 2 types of IUD:  · Copper IUD. The IUD is covered in copper wire. The copper makes a fluid that kills sperm. It can stay in place for 10 years.  · Hormone IUD. The hormone stops pregnancy from happening. It can stay in place for 5 years.  NATURAL FAMILY PLANNING BIRTH CONTROL   Natural family planning means not having sex or using barrier birth control when the woman is fertile. A woman can:  · Use a calendar to keep track of when she is fertile.  · Use a thermometer to measure her body temperature.  Protect yourself against sexual diseases no matter what type of birth control you use. Talk to your doctor about which type of birth control is best for you.  Document Released: 12/05/2008 Document Revised: 05/02/2011 Document Reviewed: 06/16/2010  ExitCare® Patient Information ©2013 [employer].

## 2012-01-25 NOTE — Progress Notes (Signed)
[facility]  WNL: Yes Regular Periods:yes Contraception: Oral Contraception  Monthly Breast exam:yes Tetanus<35yrs:no Nl.Bladder Function:yes Daily BMs:yes Healthy Diet:yes Calcium:no Mammogram:no Date of Mammogram: n/a Exercise:yes Have often Exercise: occasional  Seatbelt: yes Abuse at home: no Stressful work:yes Sigmoid-colonoscopy: n/a Bone Density: No PCP: N/A Change in PMH: None Change in [facility]: None BP 118/68  Ht 5\' 1"  (1.549 m)  Wt 169 lb (76.658 kg)  BMI 31.93 kg/m2  LMP 01/02/2012 Pt with complaints:no Physical Examination: General appearance - alert, well appearing, and in no distress Mental status - normal mood, behavior, speech, dress, motor activity, and thought processes Neck - supple, no significant adenopathy,  thyroid exam: thyroid is normal in size without nodules or tenderness Chest - clear to auscultation, no wheezes, rales or rhonchi, symmetric air entry Heart - normal rate and regular rhythm Abdomen - soft, nontender, nondistended, no masses or organomegaly Breasts - breasts appear normal, no suspicious masses, no skin or nipple changes or axillary nodes Pelvic - normal external genitalia, vulva, vagina, cervix, uterus and adnexa Rectal - rectal exam not indicated Back exam - full range of motion, no tenderness, palpable spasm or pain on motion Neurological - alert, oriented, normal speech, no focal findings or movement disorder noted Musculoskeletal - no joint tenderness, deformity or swelling Extremities - no edema, redness or tenderness in the calves or thighs Skin - normal coloration and turgor, no rashes, no suspicious skin lesions noted Routine exam Pap sent yes Mammogram due no micronor used for contraception RT 1 yr Pt is considering changing BC all options reviewed

## 2012-01-27 LAB — PAP IG, CT-NG, RFX HPV ASCU
Chlamydia Probe Amp: NEGATIVE
GC Probe Amp: NEGATIVE

## 2012-02-09 ENCOUNTER — Telehealth: Payer: Self-pay

## 2012-02-09 NOTE — Telephone Encounter (Signed)
Lm on vm for pt to call back.

## 2012-02-09 NOTE — Telephone Encounter (Signed)
Tc to pt regarding pap results. Advised pt that ASCUS was found on pap and that ND would like to repeat pap in 1 year. 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 Answered all of pt questions, pt voiced understanding.

## 2012-07-21 ENCOUNTER — Encounter (HOSPITAL_COMMUNITY): Payer: Self-pay | Admitting: Family

## 2012-07-21 ENCOUNTER — Inpatient Hospital Stay (HOSPITAL_COMMUNITY)
Admission: AD | Admit: 2012-07-21 | Discharge: 2012-07-21 | Disposition: A | Discharge status: Home / Self Care | Payer: Medicaid Other | Source: Ambulatory Visit | Attending: Obstetrics and Gynecology | Admitting: Obstetrics and Gynecology

## 2012-07-21 ENCOUNTER — Inpatient Hospital Stay (HOSPITAL_COMMUNITY): Payer: Medicaid Other

## 2012-07-21 DIAGNOSIS — R109 Unspecified abdominal pain: Secondary | ICD-10-CM | POA: Insufficient documentation

## 2012-07-21 DIAGNOSIS — Z8742 Personal history of other diseases of the female genital tract: Secondary | ICD-10-CM

## 2012-07-21 DIAGNOSIS — R030 Elevated blood-pressure reading, without diagnosis of hypertension: Secondary | ICD-10-CM | POA: Insufficient documentation

## 2012-07-21 DIAGNOSIS — O34219 Maternal care for unspecified type scar from previous cesarean delivery: Secondary | ICD-10-CM

## 2012-07-21 DIAGNOSIS — F17200 Nicotine dependence, unspecified, uncomplicated: Secondary | ICD-10-CM

## 2012-07-21 DIAGNOSIS — O200 Threatened abortion: Secondary | ICD-10-CM | POA: Insufficient documentation

## 2012-07-21 DIAGNOSIS — M549 Dorsalgia, unspecified: Secondary | ICD-10-CM | POA: Insufficient documentation

## 2012-07-21 LAB — CBC WITH DIFFERENTIAL/PLATELET
Eosinophils Relative: 3 % (ref 0–5)
HCT: 36.3 % (ref 36.0–46.0)
Hemoglobin: 12 g/dL (ref 12.0–15.0)
Lymphocytes Relative: 37 % (ref 12–46)
Lymphs Abs: 3.3 10*3/uL (ref 0.7–4.0)
MCV: 79.1 fL (ref 78.0–100.0)
Monocytes Absolute: 0.8 10*3/uL (ref 0.1–1.0)
Monocytes Relative: 8 % (ref 3–12)
RBC: 4.59 MIL/uL (ref 3.87–5.11)
RDW: 15 % (ref 11.5–15.5)
WBC: 9 10*3/uL (ref 4.0–10.5)

## 2012-07-21 LAB — URINALYSIS, ROUTINE W REFLEX MICROSCOPIC
Bilirubin Urine: NEGATIVE
Ketones, ur: NEGATIVE mg/dL
Protein, ur: NEGATIVE mg/dL
Urobilinogen, UA: 1 mg/dL (ref 0.0–1.0)

## 2012-07-21 LAB — HCG, QUANTITATIVE, PREGNANCY: hCG, Beta Chain, Quant, S: 327 m[IU]/mL — ABNORMAL HIGH (ref <5)

## 2012-07-21 LAB — POCT PREGNANCY, URINE: Preg Test, Ur: POSITIVE — AB

## 2012-07-21 LAB — URINE MICROSCOPIC-ADD ON

## 2012-07-21 LAB — WET PREP, GENITAL: Yeast Wet Prep HPF POC: NONE SEEN

## 2012-07-21 NOTE — MAU Provider Note (Signed)
History   27 yo [id number] at 4 3/7 weeks presented unannounced c/o lower abdominal pain, L>R, back pain, white vaginal d/c, and +UPT. Pregnancy was unexpected, and her partner wants her to terminate pregnancy, which patient does not believe in.  Denies dysuria, bleeding, has mild nausea, but no vomiting.  Patient Active Problem List   Diagnosis Date Noted  . Hx of ovarian cyst 07/21/2012  . Previous cesarean delivery, antepartum condition or complication x 2 07/21/2012  Ovarian cyst 2007 Hx SAB 10/2011, with D&E--had retained products, requiring repeat D&E 11/2011. Previous C/S x 2 Chief Complaint  Patient presents with  . Abdominal Pain  . Back Pain     OB History   Grav Para Term Preterm Abortions TAB SAB Ect Mult Living   5 3 3  0 1 0 1 0 0 3      Past Medical History  Diagnosis Date  . Hypertension   . Ovarian cyst   . Abnormal Pap smear     colpo, ok since  . Chlamydia   . Headache(784.0)     OTC meds prn  . Lower back pain   . Leg pain     Past Surgical History  Procedure Laterality Date  . Cesarean section      x 2  . Oophrectomy  2007    Right , d/t cyst  . Diagnostic laparoscopy  2007  . Dilation and evacuation  10/27/2011    Procedure: DILATATION AND EVACUATION;  Surgeon: Michael Litter, MD;  Location: [facility];  Service: Gynecology;  Laterality: N/A;  . Dilation and evacuation  12/04/2011    Procedure: DILATATION AND EVACUATION;  Surgeon: Michael Litter, MD;  Location: [facility];  Service: Gynecology;  Laterality: N/A;    Family History  Problem Relation Age of Onset  . Hypertension Mother   . Hypertension Father   . Other Neg Hx   . Diabetes Paternal Aunt   . Hypertension Maternal Grandmother   . Cancer Maternal Grandmother   . Hypertension Maternal Grandfather   . Hypertension Paternal Grandmother   . Hypertension Paternal Grandfather   . Mental retardation Maternal Uncle     History  Substance Use Topics  . Smoking status: Current Some Day Smoker  -- 1.00 packs/day for 4 years    Types: Cigarettes    Last Attempt to Quit: 09/14/2011  . Smokeless tobacco: Never Used     Comment: quit with pos preg  . Alcohol Use: No    Allergies: No Known Allergies  Prescriptions prior to admission  Medication Sig Dispense Refill  . Aspirin-Acetaminophen (GOODYS BODY PAIN PO) Take 2 Packages by mouth daily as needed (pain).      . Multiple Vitamins-Minerals (MULTI VITAMIN/MINERALS PO) Take 1 tablet by mouth daily.        @ROS @ Physical Exam   Blood pressure 154/93, pulse 88, temperature 98.7 F (37.1 C), temperature source Oral, resp. rate 18, height 5' 1.5" (1.562 m), weight 163 lb (73.936 kg), last menstrual period 06/20/2012.  Chest clear Heart RRR without murmur Abd mild lower abdominal tenderness, L>R, no rebound, mild guarding on left side Uterus small, mild tenderness noted [place]--left area full, moderate tenderness  Ext WNL  Results for orders placed during the hospital encounter of 07/21/12 (from the past 24 hour(s))  POCT PREGNANCY, URINE     Status: Abnormal   Collection Time    07/21/12  6:44 PM      Result Value Range   Preg Test, Ur  POSITIVE (*) NEGATIVE     ED Course  Early pregnancy Abdominal pain Relationship stressors O+ type  Plan: CBC, diff UA QHCG Wet prep GC, chlamydia [place] ordered with pending QHCG due to abdominal pain--reviewed with patient likelihood of minimal views of pregnancy status, but want to r/o ovarian cyst, etc. due to abdominal pain. May need repeat [facility] planned. Sanda Klein, CNM, will f/u on patient status.    Nigel Bridgeman CNM, [place] 07/21/2012 7:24 PM  Addendum: 5/31 at 2030  Results for orders placed during the hospital encounter of 07/21/12 (from the past 24 hour(s))  URINALYSIS, ROUTINE W REFLEX MICROSCOPIC     Status: Abnormal   Collection Time    07/21/12  6:40 PM      Result Value Range   Color, Urine YELLOW  YELLOW   APPearance CLEAR  CLEAR   Specific Gravity,  Urine 1.020  1.005 - 1.030   pH 6.0  5.0 - 8.0   Glucose, UA NEGATIVE  NEGATIVE mg/dL   Hgb urine dipstick MODERATE (*) NEGATIVE   Bilirubin Urine NEGATIVE  NEGATIVE   Ketones, ur NEGATIVE  NEGATIVE mg/dL   Protein, ur NEGATIVE  NEGATIVE mg/dL   Urobilinogen, UA 1.0  0.0 - 1.0 mg/dL   Nitrite NEGATIVE  NEGATIVE   Leukocytes, UA NEGATIVE  NEGATIVE  URINE MICROSCOPIC-ADD ON     Status: Abnormal   Collection Time    07/21/12  6:40 PM      Result Value Range   Squamous Epithelial / LPF FEW (*) RARE   WBC, UA 0-2  <3 WBC/hpf   RBC / HPF 3-6  <3 RBC/hpf   Bacteria, UA FEW (*) RARE  POCT PREGNANCY, URINE     Status: Abnormal   Collection Time    07/21/12  6:44 PM      Result Value Range   Preg Test, Ur POSITIVE (*) NEGATIVE  WET PREP, GENITAL     Status: Abnormal   Collection Time    07/21/12  7:09 PM      Result Value Range   Yeast Wet Prep HPF POC NONE SEEN  NONE SEEN   Trich, Wet Prep NONE SEEN  NONE SEEN   Clue Cells Wet Prep HPF POC FEW (*) NONE SEEN   WBC, Wet Prep HPF POC FEW (*) NONE SEEN  HCG, QUANTITATIVE, PREGNANCY     Status: Abnormal   Collection Time    07/21/12  7:18 PM      Result Value Range   hCG, Beta Chain, Quant, S 327 (*) <5 mIU/mL  CBC WITH DIFFERENTIAL     Status: None   Collection Time    07/21/12  7:32 PM      Result Value Range   WBC 9.0  4.0 - 10.5 K/uL   RBC 4.59  3.87 - 5.11 MIL/uL   Hemoglobin 12.0  12.0 - 15.0 g/dL   HCT 16.1  09.6 - 04.5 %   MCV 79.1  78.0 - 100.0 fL   MCH 26.1  26.0 - 34.0 pg   MCHC 33.1  30.0 - 36.0 g/dL   RDW 40.9  81.1 - 91.4 %   Platelets 230  150 - 400 K/uL   Neutrophils Relative % 52  43 - 77 %   Neutro Abs 4.7  1.7 - 7.7 K/uL   Lymphocytes Relative 37  12 - 46 %   Lymphs Abs 3.3  0.7 - 4.0 K/uL   Monocytes Relative 8  3 - 12 %  Monocytes Absolute 0.8  0.1 - 1.0 K/uL   Eosinophils Relative 3  0 - 5 %   Eosinophils Absolute 0.3  0.0 - 0.7 K/uL   Basophils Relative 0  0 - 1 %   Basophils Absolute 0.0  0.0 - 0.1  K/uL   Filed Vitals:   07/21/12 1836 07/21/12 1954 07/21/12 2104  BP: 154/93 145/88 151/99  Pulse: 88  79  Temp: 98.7 F (37.1 C)    TempSrc: Oral    Resp: 18    Height: 5' 1.5" (1.562 m)    Weight: 163 lb (73.936 kg)       Early pregnancy Threatened miscarriage Elevated BP  Pt to have repeat quant in office on Monday afternoon Will also recheck BP at office Support given dc'd home stable condition

## 2012-07-21 NOTE — MAU Note (Signed)
Patient presents to MAU with c/o white vaginal discharge x 1 week; +HPT; reports lower abdominal pain radiating more to L. Denies vaginal bleeding.

## 2012-07-21 NOTE — MAU Note (Signed)
Pt c/o abd and bck pain on and off for the past 3 days reports a thick white vaginal discharge as well.

## 2012-07-28 ENCOUNTER — Encounter (HOSPITAL_COMMUNITY): Payer: Self-pay | Admitting: Obstetrics and Gynecology

## 2012-07-28 ENCOUNTER — Inpatient Hospital Stay (HOSPITAL_COMMUNITY)
Admission: AD | Admit: 2012-07-28 | Discharge: 2012-07-28 | Disposition: A | Discharge status: Home / Self Care | Payer: Medicaid Other | Source: Ambulatory Visit | Attending: Obstetrics and Gynecology | Admitting: Obstetrics and Gynecology

## 2012-07-28 DIAGNOSIS — O200 Threatened abortion: Secondary | ICD-10-CM

## 2012-07-28 LAB — CBC
HCT: 35.4 % — ABNORMAL LOW (ref 36.0–46.0)
Hemoglobin: 11.9 g/dL — ABNORMAL LOW (ref 12.0–15.0)
MCH: 26.3 pg (ref 26.0–34.0)
MCHC: 33.6 g/dL (ref 30.0–36.0)
MCV: 78.3 fL (ref 78.0–100.0)
Platelets: 217 10*3/uL (ref 150–400)
RBC: 4.52 MIL/uL (ref 3.87–5.11)
RDW: 14.6 % (ref 11.5–15.5)
WBC: 8.9 10*3/uL (ref 4.0–10.5)

## 2012-07-28 LAB — HCG, QUANTITATIVE, PREGNANCY: hCG, Beta Chain, Quant, S: 5614 m[IU]/mL — ABNORMAL HIGH

## 2012-07-28 MED ORDER — IBUPROFEN 600 MG PO TABS
600.0000 mg | ORAL_TABLET | Freq: Four times a day (QID) | ORAL | Status: DC | PRN
  Administered 2012-07-28: 600 mg via ORAL
  Filled 2012-07-28: qty 1

## 2012-07-28 NOTE — MAU Note (Signed)
Pt reports she was here last week for spotting and pain . Went to office Monday u/s showed a sack no baby yet. Plan to f/u in 2 weeks. Pain and bleeding are getting worse  Today.

## 2012-07-28 NOTE — MAU Provider Note (Signed)
History   26yo, [id number] at w d presents for MAU with increased bleeding and cramping. Blood type - O Pos   LMP 4/30 U/S on 5/31 @ [redacted]w[redacted]d - no IUP seen BHCG on 6/2 743.5 BHCG on 6/5 2487 U/S on 6/6 @ [redacted]w[redacted]d gest sac seen, no yolk sac or fetal pole seen, small subchorionic hemorrhage. Plan to f/u with U/S in 2 weeks  Denies recent fever, resp or GI c/o's, UTI or PIH s/s.   Chief Complaint  Patient presents with  . Vaginal Bleeding    OB History   Grav Para Term Preterm Abortions TAB SAB Ect Mult Living   5 3 3  0 1 0 1 0 0 3      Past Medical History  Diagnosis Date  . Hypertension   . Ovarian cyst   . Abnormal Pap smear     colpo, ok since  . Chlamydia   . Headache(784.0)     OTC meds prn  . Lower back pain   . Leg pain     Past Surgical History  Procedure Laterality Date  . Cesarean section      x 2  . Oophrectomy  2007    Right , d/t cyst  . Diagnostic laparoscopy  2007  . Dilation and evacuation  10/27/2011    Procedure: DILATATION AND EVACUATION;  Surgeon: Michael Litter, MD;  Location: [facility];  Service: Gynecology;  Laterality: N/A;  . Dilation and evacuation  12/04/2011    Procedure: DILATATION AND EVACUATION;  Surgeon: Michael Litter, MD;  Location: [facility];  Service: Gynecology;  Laterality: N/A;    Family History  Problem Relation Age of Onset  . Hypertension Mother   . Hypertension Father   . Other Neg Hx   . Diabetes Paternal Aunt   . Hypertension Maternal Grandmother   . Cancer Maternal Grandmother   . Hypertension Maternal Grandfather   . Hypertension Paternal Grandmother   . Hypertension Paternal Grandfather   . Mental retardation Maternal Uncle     History  Substance Use Topics  . Smoking status: Current Some Day Smoker -- 1.00 packs/day for 4 years    Types: Cigarettes    Last Attempt to Quit: 09/14/2011  . Smokeless tobacco: Never Used     Comment: quit with pos preg  . Alcohol Use: No    Allergies: No Known Allergies  Prescriptions  prior to admission  Medication Sig Dispense Refill  . Multiple Vitamins-Minerals (MULTI VITAMIN/MINERALS PO) Take 1 tablet by mouth daily.        ROS: see HPI above, all other systems are negative   Physical Exam   Blood pressure 131/83, pulse 82, temperature 98.1 F (36.7 C), temperature source Oral, resp. rate 18, height 5' 1.5" (1.562 m), last menstrual period 06/20/2012.  Chest: Clear Heart: RRR Abdomen: gravid, NT Extremities: WNL  Pelvic:Pelvic exam: normal external genitalia, vulva, vagina, cervix, uterus and adnexa. No blood noted in vaginal vault or at cervical os.  Cervix closed  Results for orders placed during the hospital encounter of 07/28/12 (from the past 24 hour(s))  CBC     Status: Abnormal   Collection Time    07/28/12  2:30 PM      Result Value Range   WBC 8.9  4.0 - 10.5 K/uL   RBC 4.52  3.87 - 5.11 MIL/uL   Hemoglobin 11.9 (*) 12.0 - 15.0 g/dL   HCT 96.0 (*) 45.4 - 09.8 %   MCV 78.3  78.0 -  100.0 fL   MCH 26.3  26.0 - 34.0 pg   MCHC 33.6  30.0 - 36.0 g/dL   RDW 16.1  09.6 - 04.5 %   Platelets 217  150 - 400 K/uL  HCG, QUANTITATIVE, PREGNANCY     Status: Abnormal   Collection Time    07/28/12  2:30 PM      Result Value Range   hCG, Beta Chain, Quant, S 5614 (*) <5 mIU/mL      ED Course  Threatened AB Very emotional - states she has little to no support at home  C/w Dr. Normand Sloop CBC and Sharene Butters  D/c home with bleeding precautions Loss packet given to pt - and pt encouraged to seek support with groups in the packet. F/u with schedule appt 08/02/12 for NOB interview and 08/10/12 for U/S Call if she needs help with anything before her appointment    Haroldine Laws CNM, MSN 07/28/2012 1:54 PM

## 2012-07-28 NOTE — Progress Notes (Signed)
Chaplain paged at 1346 arriving in MAU at 1359  [name] is [redacted] weeks pregnant and there is a possibility that she may miscarry. The birth father (referred to as "My Boyfriend") does not want the child and is absent from the relationship saying they need to cool it for the unforeseen future. There are two children born from this relationship currently in the home. What relationship the birth father will now have with these children is in doubt. Last year [name] experienced a miscarriage and the thoughts of that pregnancy haunts this pregnancy.  Discussions centered on faith issues concerning God's presence during times of difficulty and when we feel alone and abandoned. [name] is a person of deep Christian faith. [name] was not forthcoming about how her faith community can or will provide support.  [name] requested assistance in praying while she is alone. Discussion and demonstration of meditation exercises to both relax and connect with God more fully was conducted. [name] responded positively to these exercises.   It is important that the safety and support she is experiencing from hospital and staff be re-enforced, because she is not finding such in her wider world experiences. [name] wishes to express her deep appreciation to her nurses for providing excellent care and support to her during this trying time.  Chaplain Follow-up as needed or requested. Page Lunette Stands if [name] requests spiritual care and/or support.  Benjie Karvonen. Ralene Gasparyan, DMin, MDiv Chaplain

## 2012-07-30 ENCOUNTER — Ambulatory Visit (HOSPITAL_COMMUNITY)
Admission: RE | Admit: 2012-07-30 | Discharge: 2012-07-30 | Disposition: A | Discharge status: Home / Self Care | Payer: Medicaid Other | Attending: Psychiatry | Admitting: Psychiatry

## 2012-07-30 DIAGNOSIS — F39 Unspecified mood [affective] disorder: Secondary | ICD-10-CM | POA: Insufficient documentation

## 2012-07-30 NOTE — BH Assessment (Addendum)
Assessment Note   [name] is an 27 y.o. female.Pt presented to [facility] to see a therapist or psychiatrist due to being depressed.  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 Also her boyfriend who is the father of two of her three children has threaten to leave her due to being pregnant again.  She just learned her grandfather was just diagnosed with dementia and alzheimer and parkinson's disease. She reports being depressed and needing someone to talk to. She denies s/i, h/i and is not psychotic nor delusional. She tearful through the assessment.  Called [facility] out pt and Lupita Leash reported next appt would be in August.  Called [facility] and was unable to get through.  Pt agreed to go to [facility] as a walk-in.  Gave pt note for job to verify pt came for assessment. MSE completed by Roma Schanz.Pt given suicide prevention pamphlet.   Axis I: Mood Disorder NOS Axis II: Deferred Axis III:  Past Medical History  Diagnosis Date  . Hypertension   . Ovarian cyst   . Abnormal Pap smear     colpo, ok since  . Chlamydia   . Headache(784.0)     OTC meds prn  . Lower back pain   . Leg pain    Axis IV: occupational problems, problems with access to health care services and problems with primary support group Axis V: 51-60 moderate symptoms  Past Medical History:  Past Medical History  Diagnosis Date  . Hypertension   . Ovarian cyst   . Abnormal Pap smear     colpo, ok since  . Chlamydia   . Headache(784.0)     OTC meds prn  . Lower back pain   . Leg pain     Past Surgical History  Procedure Laterality Date  . Cesarean section      x 2  . Oophrectomy  2007    Right , d/t cyst  . Diagnostic laparoscopy  2007  . Dilation and evacuation  10/27/2011    Procedure: DILATATION AND EVACUATION;  Surgeon:  Michael Litter, MD;  Location: [facility];  Service: Gynecology;  Laterality: N/A;  . Dilation and evacuation  12/04/2011    Procedure: DILATATION AND EVACUATION;  Surgeon: Michael Litter, MD;  Location: [facility];  Service: Gynecology;  Laterality: N/A;    Family History:  Family History  Problem Relation Age of Onset  . Hypertension Mother   . Hypertension Father   . Other Neg Hx   . Diabetes Paternal Aunt   . Hypertension Maternal Grandmother   . Cancer Maternal Grandmother   . Hypertension Maternal Grandfather   . Hypertension Paternal Grandmother   . Hypertension Paternal Grandfather   . Mental retardation Maternal Uncle     Social History:  reports that she has been smoking Cigarettes.  She has a 4 pack-year smoking history. She has never used smokeless tobacco. She reports that she does not drink alcohol or use illicit drugs.  Additional Social History:  Alcohol / Drug Use Pain Medications: na Prescriptions: na Over the Counter: na History of alcohol / drug use?: No history of alcohol / drug abuse  CIWA:   COWS:    Allergies: No Known Allergies  Home Medications:  (Not in a hospital admission)  OB/GYN Status:  Patient's last menstrual period was 06/20/2012.  General Assessment Data Location of Assessment: [facility] Assessment Services Living Arrangements: Spouse/significant other (and 3 children) Can pt return to current living arrangement?: Yes Admission Status: Voluntary Is patient capable of signing voluntary admission?: Yes Transfer from: Home Referral Source: Self/Family/Friend  Education Status Contact person: Alfonso Ramus [phone]  Risk to self Suicidal Ideation: No Suicidal Intent: No Is patient at risk for suicide?: No Suicidal Plan?: No Access to Means: No What has been your use of drugs/alcohol within the last 12 months?: denies Previous Attempts/Gestures: No How many times?: 0 Other Self Harm Risks: 0 Triggers for Past Attempts: None  known Intentional Self Injurious Behavior: None Family Suicide History: No Recent stressful life event(s): Loss (Comment);Other (Comment) (BF hgas threaten to leave and G-Father has medical pxs) Persecutory voices/beliefs?: No Depression: Yes Depression Symptoms: Despondent;Loss of interest in usual pleasures;Tearfulness;Fatigue Substance abuse history and/or treatment for substance abuse?: No Suicide prevention information given to non-admitted patients: Yes  Risk to Others Homicidal Ideation: No Thoughts of Harm to Others: No Current Homicidal Intent: No Current Homicidal Plan: No Access to Homicidal Means: No Identified Victim: na History of harm to others?: No Assessment of Violence: None Noted Violent Behavior Description: na Does patient have access to weapons?: No Criminal Charges Pending?: No Does patient have a court date: No  Psychosis Hallucinations: None noted Delusions: None noted  Mental Status Report Appear/Hygiene: Improved Eye Contact: Good Motor Activity: Freedom of movement Speech: Logical/coherent;Soft Level of Consciousness: Alert Mood: Depressed;Despair;Sad Affect: Appropriate to circumstance;Depressed;Sad Anxiety Level: None Thought Processes: Coherent;Relevant Judgement: Unimpaired Orientation: Person;Place;Time;Situation Obsessive Compulsive Thoughts/Behaviors: None  Cognitive Functioning Concentration: Normal Memory: Recent Intact;Remote Intact IQ: Average Insight: Good Impulse Control: Good Appetite: Good Weight Loss: 0 Weight Gain: 0 Sleep: No Change Total Hours of Sleep: 7 Vegetative Symptoms: None  ADLScreening [facility] Assessment Services) Patient's cognitive ability adequate to safely complete daily activities?: Yes Patient able to express need for assistance with ADLs?: Yes Independently performs ADLs?: Yes (appropriate for developmental age)  Abuse/Neglect [facility]) Physical Abuse: Denies Verbal Abuse: Denies Sexual Abuse:  Denies  Prior Inpatient Therapy Prior Inpatient Therapy: No Prior Therapy Dates: na Prior Therapy Facilty/Provider(s): na Reason for Treatment: na  Prior Outpatient Therapy Prior Outpatient Therapy: No Prior Therapy Dates: na Prior Therapy Facilty/Provider(s): na Reason for Treatment: na  ADL Screening (condition at time of admission) Patient's cognitive ability adequate to safely complete daily activities?: Yes Patient able to express need for assistance with ADLs?: Yes Independently performs ADLs?: Yes (appropriate for developmental age) Weakness of Legs: None Weakness of Arms/Hands: None     Therapy Consults (therapy consults require a physician order) PT Evaluation Needed: No OT Evalulation Needed: No SLP Evaluation Needed: No Abuse/Neglect Assessment (Assessment to be complete while patient is alone) Physical Abuse: Denies Verbal Abuse: Denies Sexual Abuse: Denies Exploitation of patient/patient's resources: Denies Self-Neglect: Denies Values / Beliefs Spiritual Requests During Hospitalization: None Consults Spiritual Care Consult Needed: No Social Work Consult Needed: No Merchant navy officer (For Healthcare) Advance Directive: Patient does not have advance directive;Patient would not like information Pre-existing out of facility DNR order (yellow form or pink MOST form): No    Additional Information 1:1 In Past 12 Months?: No CIRT Risk: No Elopement Risk: No Does patient have medical clearance?: No     Disposition: Given Referrals [facility] out-Pt, [facility], and [facility].  MSE completed by Roma Schanz Suicide information given Disposition Initial Assessment Completed for this Encounter: Yes Disposition of Patient: Referred to  Patient referred to:  ([facility], [facility]. [facility] outpatient)  On Site Evaluation by:   Reviewed with Physician:     Hattie Perch Winford 07/30/2012 1:08 PM

## 2012-07-30 NOTE — H&P (Signed)
Behavioral Health Medical Screening Exam  [name] is an 27 y.o. female.  Review of Systems  Constitutional: Negative.   HENT: Negative.   Eyes: Negative.   Respiratory: Negative.   Cardiovascular: Negative.   Gastrointestinal: Negative.   Genitourinary: Negative.   Musculoskeletal: Negative.   Skin: Negative.   Neurological: Negative.   Endo/Heme/Allergies: Negative.   Psychiatric/Behavioral: Positive for depression (Sad, facial expressions, teary eyes). Negative for suicidal ideas, hallucinations, memory loss and substance abuse. The patient is not nervous/anxious and does not have insomnia.     Physical Exam  Constitutional: She is oriented to person, place, and time. She appears well-developed.  HENT:  Head: Normocephalic.  Eyes: Pupils are equal, round, and reactive to light.  Neck: Normal range of motion.  Cardiovascular: Normal rate.   Respiratory: Effort normal.  GI:  Did not assess, Patient is pregnant per her report  Musculoskeletal: Normal range of motion.  Neurological: She is alert and oriented to person, place, and time.  Skin: Skin is warm and dry.    Last menstrual period 06/20/2012.  Recommendations: Based on my evaluation the patient does not appear to have an emergency medical condition. Speech clear, not pressured, alert and oriented x 4 and aware of situation, judgment appears to be intact, highly emotional with intermittent crying spells. She is casually dressed, weather appropriate expressed desire to participate in outpatient counseling sessions and to work an after care plan. She currently denies any suicidal homicidal ideation, auditory, visual hallucinations, delusional thoughts and or paranoia. She does have 3 children and currently expecting her 4th child. She was seen assessed by an OBGYN on 07/27/12 and has a follow-up appointment on 08/10/12. She is not suicidal or has any hx suicide attempts. She is being [facility] to the [facility] at [address] [place]. The address, contact information provided for patient in writing. Patient left [facility] in no apparent distress via personal arranged transport.   Based on my evaluation the patient does not appear to have an emergency medical condition. [place]  Sanjuana Kava, PMHNP-BC 07/30/2012, 1:58 PM Agree with assessment and plan Madie Reno A. Dub Mikes, M.D.

## 2012-08-14 ENCOUNTER — Encounter (HOSPITAL_COMMUNITY): Payer: Self-pay | Admitting: *Deleted

## 2012-08-14 ENCOUNTER — Inpatient Hospital Stay (HOSPITAL_COMMUNITY)
Admission: AD | Admit: 2012-08-14 | Discharge: 2012-08-14 | Disposition: A | Discharge status: Home / Self Care | Payer: Medicaid Other | Source: Ambulatory Visit | Attending: Obstetrics and Gynecology | Admitting: Obstetrics and Gynecology

## 2012-08-14 ENCOUNTER — Inpatient Hospital Stay (HOSPITAL_COMMUNITY): Payer: Medicaid Other

## 2012-08-14 DIAGNOSIS — O210 Mild hyperemesis gravidarum: Secondary | ICD-10-CM | POA: Insufficient documentation

## 2012-08-14 DIAGNOSIS — O26859 Spotting complicating pregnancy, unspecified trimester: Secondary | ICD-10-CM | POA: Insufficient documentation

## 2012-08-14 DIAGNOSIS — O4690 Antepartum hemorrhage, unspecified, unspecified trimester: Secondary | ICD-10-CM | POA: Diagnosis present

## 2012-08-14 LAB — URINALYSIS, ROUTINE W REFLEX MICROSCOPIC
Glucose, UA: NEGATIVE mg/dL
Leukocytes, UA: NEGATIVE
Nitrite: NEGATIVE
Protein, ur: NEGATIVE mg/dL
pH: 8 (ref 5.0–8.0)

## 2012-08-14 LAB — URINE MICROSCOPIC-ADD ON

## 2012-08-14 MED ORDER — LACTATED RINGERS IV BOLUS (SEPSIS)
500.0000 mL | Freq: Once | INTRAVENOUS | Status: AC
  Administered 2012-08-14: 08:00:00 via INTRAVENOUS

## 2012-08-14 MED ORDER — ONDANSETRON HCL 4 MG/2ML IJ SOLN
4.0000 mg | Freq: Once | INTRAMUSCULAR | Status: AC
  Administered 2012-08-14: 4 mg via INTRAVENOUS
  Filled 2012-08-14: qty 2

## 2012-08-14 MED ORDER — LACTATED RINGERS IV SOLN: INTRAVENOUS | Status: DC

## 2012-08-14 NOTE — MAU Note (Signed)
Gevena Barre CNM notified of pt.

## 2012-08-14 NOTE — MAU Note (Signed)
Pt G5 P3 at 7.6wks with bleeding and cramping this morning.

## 2012-08-14 NOTE — MAU Note (Signed)
C/o Nausea and cramping.  No active bleeding noted with pelvic.

## 2012-08-14 NOTE — MAU Provider Note (Signed)
History   27 yo G5P3013 at 7 6/7 weeks presented unannounced at 5:30am c/o spotting and cramping this morning.  Has had several evaluations for the same, with last [place] at office last week.  Partner not supportive of this pregnancy.  Patient also had SAB last year, which has made her more anxious this pregnancy.  Also c/o N/V--on med at home, but "no help today".  Recent evaluation at [facility] for depression--referred to [facility].  Patient reports no worsening of sx.  Patient Active Problem List   Diagnosis Date Noted  . Vaginal bleeding in pregnancy 08/14/2012  . Hx of ovarian cyst 07/21/2012  . Previous cesarean delivery, antepartum condition or complication x 2 07/21/2012  . Smoker 07/21/2012     Chief Complaint  Patient presents with  . Vaginal Bleeding  . Abdominal Pain   @SFHPI @  OB History   Grav Para Term Preterm Abortions TAB SAB Ect Mult Living   5 3 3  0 1 0 1 0 0 3      Past Medical History  Diagnosis Date  . Hypertension   . Ovarian cyst   . Abnormal Pap smear     colpo, ok since  . Chlamydia   . Headache(784.0)     OTC meds prn  . Lower back pain   . Leg pain     Past Surgical History  Procedure Laterality Date  . Cesarean section      x 2  . Oophrectomy  2007    Right , d/t cyst  . Diagnostic laparoscopy  2007  . Dilation and evacuation  10/27/2011    Procedure: DILATATION AND EVACUATION;  Surgeon: Michael Litter, MD;  Location: [facility];  Service: Gynecology;  Laterality: N/A;  . Dilation and evacuation  12/04/2011    Procedure: DILATATION AND EVACUATION;  Surgeon: Michael Litter, MD;  Location: [facility];  Service: Gynecology;  Laterality: N/A;    Family History  Problem Relation Age of Onset  . Hypertension Mother   . Hypertension Father   . Other Neg Hx   . Diabetes Paternal Aunt   . Hypertension Maternal Grandmother   . Cancer Maternal Grandmother   . Hypertension Maternal Grandfather   . Hypertension Paternal Grandmother   .  Hypertension Paternal Grandfather   . Mental retardation Maternal Uncle     History  Substance Use Topics  . Smoking status: Former Smoker -- 1.00 packs/day for 4 years    Types: Cigarettes    Quit date: 07/21/2011  . Smokeless tobacco: Never Used     Comment: quit with pos preg  . Alcohol Use: No    Allergies: No Known Allergies  Prescriptions prior to admission  Medication Sig Dispense Refill  . Multiple Vitamins-Minerals (MULTI VITAMIN/MINERALS PO) Take 1 tablet by mouth daily.         Physical Exam   Blood pressure 129/79, pulse 70, temperature 98.4 F (36.9 C), resp. rate 18, height 5' (1.524 m), weight 164 lb 12.8 oz (74.753 kg), last menstrual period 06/20/2012.  Chest clear Heart RRR without murmur Abd soft, mild tenderness over suprapubic area. Pelvic--small amount pink spotting mixed with d/c.  Cervix closed and long. Ext WNL  [place]:  SIUP, 8 weeks, right ovary not visualized, left ovary WNL.  FHR 152.  Results for orders placed during the hospital encounter of 08/14/12 (from the past 24 hour(s))  URINALYSIS, ROUTINE W REFLEX MICROSCOPIC     Status: Abnormal   Collection Time  08/14/12  6:10 AM      Result Value Range   Color, Urine YELLOW  YELLOW   APPearance CLEAR  CLEAR   Specific Gravity, Urine 1.015  1.005 - 1.030   pH 8.0  5.0 - 8.0   Glucose, UA NEGATIVE  NEGATIVE mg/dL   Hgb urine dipstick MODERATE (*) NEGATIVE   Bilirubin Urine NEGATIVE  NEGATIVE   Ketones, ur NEGATIVE  NEGATIVE mg/dL   Protein, ur NEGATIVE  NEGATIVE mg/dL   Urobilinogen, UA 0.2  0.0 - 1.0 mg/dL   Nitrite NEGATIVE  NEGATIVE   Leukocytes, UA NEGATIVE  NEGATIVE  URINE MICROSCOPIC-ADD ON     Status: None   Collection Time    08/14/12  6:10 AM      Result Value Range   Squamous Epithelial / LPF RARE  RARE   WBC, UA 0-2  <3 WBC/hpf   RBC / HPF 3-6  <3 RBC/hpf   GC, chlamydia negative 07/21/12 CBC/diff 07/21/12  Received 1 bag IVF and Zofran with benefit.  ED Course  IUP at  8 weeks 1st trimester spotting N/V of early pregnancy  Plan: Continue anti-emetic at home. Reassured patient regarding status of pregnancy Support offered for issues Keep scheduled visit at [facility] for NOB.  Call prn.  Nigel Bridgeman CNM, [place] 08/14/2012 7:30 AM

## 2012-08-18 ENCOUNTER — Inpatient Hospital Stay (HOSPITAL_COMMUNITY)
Admission: AD | Admit: 2012-08-18 | Discharge: 2012-08-18 | Disposition: A | Discharge status: Home / Self Care | Payer: Medicaid Other | Source: Ambulatory Visit | Attending: Obstetrics and Gynecology | Admitting: Obstetrics and Gynecology

## 2012-08-18 ENCOUNTER — Encounter (HOSPITAL_COMMUNITY): Payer: Self-pay | Admitting: *Deleted

## 2012-08-18 DIAGNOSIS — O210 Mild hyperemesis gravidarum: Secondary | ICD-10-CM | POA: Insufficient documentation

## 2012-08-18 DIAGNOSIS — B9689 Other specified bacterial agents as the cause of diseases classified elsewhere: Secondary | ICD-10-CM | POA: Insufficient documentation

## 2012-08-18 DIAGNOSIS — O219 Vomiting of pregnancy, unspecified: Secondary | ICD-10-CM

## 2012-08-18 DIAGNOSIS — N949 Unspecified condition associated with female genital organs and menstrual cycle: Secondary | ICD-10-CM | POA: Insufficient documentation

## 2012-08-18 DIAGNOSIS — O2390 Unspecified genitourinary tract infection in pregnancy, unspecified trimester: Secondary | ICD-10-CM | POA: Insufficient documentation

## 2012-08-18 DIAGNOSIS — R3129 Other microscopic hematuria: Secondary | ICD-10-CM

## 2012-08-18 DIAGNOSIS — N760 Acute vaginitis: Secondary | ICD-10-CM | POA: Insufficient documentation

## 2012-08-18 DIAGNOSIS — A499 Bacterial infection, unspecified: Secondary | ICD-10-CM | POA: Insufficient documentation

## 2012-08-18 DIAGNOSIS — K5900 Constipation, unspecified: Secondary | ICD-10-CM | POA: Insufficient documentation

## 2012-08-18 DIAGNOSIS — R319 Hematuria, unspecified: Secondary | ICD-10-CM | POA: Insufficient documentation

## 2012-08-18 DIAGNOSIS — O10019 Pre-existing essential hypertension complicating pregnancy, unspecified trimester: Secondary | ICD-10-CM | POA: Insufficient documentation

## 2012-08-18 LAB — WET PREP, GENITAL: Yeast Wet Prep HPF POC: NONE SEEN

## 2012-08-18 LAB — URINALYSIS, ROUTINE W REFLEX MICROSCOPIC
Bilirubin Urine: NEGATIVE
Glucose, UA: NEGATIVE mg/dL
Ketones, ur: 40 mg/dL — AB
Leukocytes, UA: NEGATIVE
Protein, ur: NEGATIVE mg/dL

## 2012-08-18 LAB — URINE MICROSCOPIC-ADD ON

## 2012-08-18 MED ORDER — LACTATED RINGERS IV BOLUS (SEPSIS)
500.0000 mL | Freq: Once | INTRAVENOUS | Status: AC
  Administered 2012-08-18: 500 mL via INTRAVENOUS

## 2012-08-18 MED ORDER — ONDANSETRON HCL 4 MG/2ML IJ SOLN
4.0000 mg | Freq: Once | INTRAMUSCULAR | Status: AC
  Administered 2012-08-18: 4 mg via INTRAVENOUS
  Filled 2012-08-18: qty 2

## 2012-08-18 MED ORDER — PROMETHAZINE HCL 50 MG PO TABS: 25.0000 mg | ORAL_TABLET | Freq: Four times a day (QID) | ORAL | Status: DC | PRN

## 2012-08-18 MED ORDER — LACTATED RINGERS IV SOLN
INTRAVENOUS | Status: DC
  Administered 2012-08-18: 19:00:00 via INTRAVENOUS

## 2012-08-18 MED ORDER — ONDANSETRON 8 MG PO TBDP: 8.0000 mg | ORAL_TABLET | Freq: Three times a day (TID) | ORAL | Status: DC | PRN

## 2012-08-18 MED ORDER — CLINDAMYCIN HCL 300 MG PO CAPS: 300.0000 mg | ORAL_CAPSULE | Freq: Two times a day (BID) | ORAL | Status: DC

## 2012-08-18 NOTE — MAU Provider Note (Signed)
History   CSN: [id number]  Arrival date and time: 08/18/12 1728   First Provider Initiated Contact with Patient 08/18/12 1942      Chief Complaint  Patient presents with  . Emesis During Pregnancy   HPI Pt is a 27yo G5 P3,0,1,3 who presents unannounced to MAU at 8w 5d gestation with c/o persistent nausea and vomiting and unable to keep down food or fluids over the past 2 days.  Pt was seen in MAU on 08/14/12 for same complaints and was given IVF and Zofran IV.  Pt states she does not have a rx for nausea meds and home remedies have been ineffective.  She also reports contd small amt of vaginal spotting as well as some increased vaginal discharge.  She also c/o sm amt of menstrual type cramping.  Denies itching, burning or odor with discharge.  Pt sched for NOB visit on 05/28/12.  Pt had ultrasound for viability at time of last visit on 08/14/12 and denies any increased vaginal bleeding since that time. States nausea is 9/10 on scale.  She also reports constipation and reports her last BM was approx 5-6 days ago.  OB History   Grav Para Term Preterm Abortions TAB SAB Ect Mult Living   5 3 3  0 1 0 1 0 0 3      Past Medical History  Diagnosis Date  . Hypertension   . Ovarian cyst   . Abnormal Pap smear     colpo, ok since  . Chlamydia   . Headache(784.0)     OTC meds prn  . Lower back pain   . Leg pain     Past Surgical History  Procedure Laterality Date  . Cesarean section      x 2  . Oophrectomy  2007    Right , d/t cyst  . Diagnostic laparoscopy  2007  . Dilation and evacuation  10/27/2011    Procedure: DILATATION AND EVACUATION;  Surgeon: Michael Litter, MD;  Location: [facility];  Service: Gynecology;  Laterality: N/A;  . Dilation and evacuation  12/04/2011    Procedure: DILATATION AND EVACUATION;  Surgeon: Michael Litter, MD;  Location: [facility];  Service: Gynecology;  Laterality: N/A;    Family History  Problem Relation Age of Onset  . Hypertension Mother   . Hypertension  Father   . Other Neg Hx   . Diabetes Paternal Aunt   . Hypertension Maternal Grandmother   . Cancer Maternal Grandmother   . Hypertension Maternal Grandfather   . Hypertension Paternal Grandmother   . Hypertension Paternal Grandfather   . Mental retardation Maternal Uncle     History  Substance Use Topics  . Smoking status: Former Smoker -- 1.00 packs/day for 4 years    Types: Cigarettes    Quit date: 07/21/2011  . Smokeless tobacco: Never Used     Comment: quit with pos preg  . Alcohol Use: No    Allergies: No Known Allergies  Prescriptions prior to admission  Medication Sig Dispense Refill  . Prenatal Vit-Fe Fumarate-FA (PRENATAL MULTIVITAMIN) TABS Take 1 tablet by mouth every morning.        Review of Systems  Constitutional: Negative.   HENT: Negative.   Eyes: Negative.   Respiratory: Negative.   Cardiovascular: Negative.   Gastrointestinal: Positive for nausea, vomiting and constipation.       No BM x 5-6 days  Genitourinary: Negative.   Musculoskeletal: Negative.   Skin: Negative.   Neurological: Negative.  Endo/Heme/Allergies: Negative.   Psychiatric/Behavioral: Negative.    Physical Exam   Blood pressure 139/91, pulse 90, temperature 98.2 F (36.8 C), temperature source Oral, resp. rate 18, height 5' (1.524 m), weight 73.211 kg (161 lb 6.4 oz), last menstrual period 06/20/2012.  Physical Exam  Constitutional: She is oriented to person, place, and time. She appears well-developed and well-nourished.  HENT:  Head: Normocephalic and atraumatic.  Right Ear: External ear normal.  Left Ear: External ear normal.  Nose: Nose normal.  Eyes: Conjunctivae are normal.  Neck: Normal range of motion. Neck supple.  Cardiovascular: Normal rate and intact distal pulses.   Respiratory: Effort normal and breath sounds normal.  GI: Soft. Bowel sounds are normal. She exhibits no distension. There is no tenderness. There is no rebound and no guarding.  Genitourinary:  Uterus normal. Vaginal discharge found.  Ext gent WNL.  BUS neg.  Sm amt blood tinged creamy discharge noted in vault.  Cx without lesions or discharge.  SVE: Closed/Thick.  Neg CMT.  Adnexa without tenderness or masses bilat.  Ut mobile, NT and c/w dates at 8wks.    Musculoskeletal: Normal range of motion.  Neg CVAT bilat.  Neurological: She is alert and oriented to person, place, and time. She has normal reflexes.  Skin: Skin is warm and dry.  Psychiatric: She has a normal mood and affect. Her behavior is normal. Thought content normal.    MAU Course  Procedures Results for orders placed during the hospital encounter of 08/18/12 (from the past 24 hour(s))  URINALYSIS, ROUTINE W REFLEX MICROSCOPIC     Status: Abnormal   Collection Time    08/18/12  5:45 PM      Result Value Range   Color, Urine YELLOW  YELLOW   APPearance CLEAR  CLEAR   Specific Gravity, Urine 1.020  1.005 - 1.030   pH 8.0  5.0 - 8.0   Glucose, UA NEGATIVE  NEGATIVE mg/dL   Hgb urine dipstick LARGE (*) NEGATIVE   Bilirubin Urine NEGATIVE  NEGATIVE   Ketones, ur 40 (*) NEGATIVE mg/dL   Protein, ur NEGATIVE  NEGATIVE mg/dL   Urobilinogen, UA 0.2  0.0 - 1.0 mg/dL   Nitrite NEGATIVE  NEGATIVE   Leukocytes, UA NEGATIVE  NEGATIVE  URINE MICROSCOPIC-ADD ON     Status: Abnormal   Collection Time    08/18/12  5:45 PM      Result Value Range   Squamous Epithelial / LPF FEW (*) RARE   WBC, UA 0-2  <3 WBC/hpf   RBC / HPF 7-10  <3 RBC/hpf  Microscopic wet-mount exam shows clue cells.  Assessment and Plan  IUP at 8w 3d Nausea & Vomiting in pregnancy Hematuria Constipation Bacterial vaginosis Chronic hypertension  IVF and IV Zofran given with significant improvement in nausea per pt.  States nausea now 3/10 on scale.  Rx Zofran 8mg  ODT and Phenergan 25mg  tabs given.  Pt also encouraged to use Vit B6 25mg  tid with Unisom at HS.  Pt tol 12oz of Sprite prior to discharge without nausea or vomiting.   Constipation relief  measures d/w pt. Will send urine culture to assess hematuria Rx Clindamycin 300mg  po bid x 7 days for BV. F/U as sched at NOB visit 08/27/12.      Allena Pietila O. 08/18/2012, 7:56 PM

## 2012-08-18 NOTE — MAU Note (Signed)
Pt presents with increased nausea since yesterday. Pt states she has vomited over 10 times in past 24 hours. Pt unable to keep anything down.

## 2012-08-18 NOTE — MAU Note (Signed)
Conni Elliot. CNM at bedside evaluating patient.

## 2012-08-18 NOTE — MAU Note (Signed)
C/o Nausea vomiting since  Tuesday. No rx prescribed.

## 2012-08-20 LAB — URINE CULTURE: Colony Count: 35000

## 2012-09-19 ENCOUNTER — Encounter (HOSPITAL_COMMUNITY): Payer: Self-pay | Admitting: *Deleted

## 2012-09-19 ENCOUNTER — Inpatient Hospital Stay (HOSPITAL_COMMUNITY)
Admission: AD | Admit: 2012-09-19 | Discharge: 2012-09-19 | Disposition: A | Discharge status: Home / Self Care | Payer: Medicaid Other | Source: Ambulatory Visit | Attending: Obstetrics and Gynecology | Admitting: Obstetrics and Gynecology

## 2012-09-19 DIAGNOSIS — R109 Unspecified abdominal pain: Secondary | ICD-10-CM | POA: Insufficient documentation

## 2012-09-19 DIAGNOSIS — O209 Hemorrhage in early pregnancy, unspecified: Secondary | ICD-10-CM | POA: Insufficient documentation

## 2012-09-19 LAB — WET PREP, GENITAL: Yeast Wet Prep HPF POC: NONE SEEN

## 2012-09-19 LAB — URINALYSIS, ROUTINE W REFLEX MICROSCOPIC
Glucose, UA: NEGATIVE mg/dL
Ketones, ur: NEGATIVE mg/dL
Leukocytes, UA: NEGATIVE
Nitrite: NEGATIVE
Protein, ur: NEGATIVE mg/dL

## 2012-09-19 LAB — URINE MICROSCOPIC-ADD ON

## 2012-09-19 NOTE — MAU Provider Note (Signed)
History   [name] is a 26y.o. BF at [redacted]w[redacted]d who presents unannounced w/ CC of VB a little after 1900 tonight when went to [place].  Dk brown blood only on tissue w/ wiping, and small amt in toilet.  Not having BM at that time.  Emesis earlier today.  No IC for at least one week.  Normal 1st trimester screen for NT 2d ago (7/28) at office.  Pt w/ h/o VB earlier this pregnancy.  Since incident has noted light spotting w/ wiping.  No pain.  Normal BM's recently.  On diclegis for persisting n/v of pregnancy.  No fever or chills.  No UTI s/s.  No resp c/o's.  CSN: [id number]  Arrival date and time: 09/19/12 2033   First Provider Initiated Contact with Patient 09/19/12 2116      Chief Complaint  Patient presents with  . Vaginal Bleeding   HPI  OB History   Grav Para Term Preterm Abortions TAB SAB Ect Mult Living   5 3 3  0 1 0 1 0 0 3      Past Medical History  Diagnosis Date  . Hypertension   . Ovarian cyst   . Abnormal Pap smear     colpo, ok since  . Chlamydia   . Headache(784.0)     OTC meds prn  . Lower back pain   . Leg pain     Past Surgical History  Procedure Laterality Date  . Cesarean section      x 2  . Oophrectomy  2007    Right , d/t cyst  . Diagnostic laparoscopy  2007  . Dilation and evacuation  10/27/2011    Procedure: DILATATION AND EVACUATION;  Surgeon: Michael Litter, MD;  Location: [facility];  Service: Gynecology;  Laterality: N/A;  . Dilation and evacuation  12/04/2011    Procedure: DILATATION AND EVACUATION;  Surgeon: Michael Litter, MD;  Location: [facility];  Service: Gynecology;  Laterality: N/A;    Family History  Problem Relation Age of Onset  . Hypertension Mother   . Hypertension Father   . Other Neg Hx   . Diabetes Paternal Aunt   . Hypertension Maternal Grandmother   . Cancer Maternal Grandmother   . Hypertension Maternal Grandfather   . Hypertension Paternal Grandmother   . Hypertension Paternal Grandfather   . Mental retardation Maternal  Uncle     History  Substance Use Topics  . Smoking status: Former Smoker -- 1.00 packs/day for 4 years    Types: Cigarettes    Quit date: 07/21/2011  . Smokeless tobacco: Never Used     Comment: quit with pos preg  . Alcohol Use: No    Allergies: No Known Allergies  Prescriptions prior to admission  Medication Sig Dispense Refill  . Doxylamine-Pyridoxine (DICLEGIS PO) Take 2 capsules by mouth at bedtime.      . Prenatal Vit-Fe Fumarate-FA (PRENATAL MULTIVITAMIN) TABS Take 1 tablet by mouth every morning.        ROS--see HPI above Physical Exam   Blood pressure 137/85, pulse 78, temperature 97.4 F (36.3 C), temperature source Oral, resp. rate 18, height 5\' 2"  (1.575 m), weight 166 lb 12.8 oz (75.66 kg), last menstrual period 06/20/2012.  Physical Exam  Constitutional: She is oriented to person, place, and time. She appears well-developed and well-nourished. No distress.  HENT:  Head: Normocephalic and atraumatic.  Eyes: Pupils are equal, round, and reactive to light.  Cardiovascular: Normal rate.   Respiratory: Effort normal.  GI: Soft.  Gravid: FHT=153; previous c/s scar WNL  Genitourinary:  SSE: small amt of light tan/pink-tinged d/c in vault. Cx: closed/long  Neurological: She is alert and oriented to person, place, and time.  Skin: Skin is warm and dry.    MAU Course  Procedures 1. FHT=153 bpm 2. Gc/ct--neg 3. Wet prep--neg (few WBC) 4. U/a--WNL except mod Hgb  Assessment and Plan  1. [redacted]w[redacted]d 2. Recurring VB in pregnancy 3. Rh pos 4. cx stable and closed 5. Previous c/s x2 6. No abnormalities seen on u/s at office 2d ago   1. D/c'd home w/ bleeding precautions; instructed on pelvic rest until 7 consecutive days w/ no brown, red, or pink d/c 2. F/u at [facility] as previously scheduled or prn worsening s/s or concerns   Darothy Courtright H 09/19/2012, 9:28 PM

## 2012-09-19 NOTE — MAU Note (Signed)
Pt presents with bleeding.  Pt noted a "gush" at 1915 and notw it is just "trickling out" per pt.  Pt with small about of cramping.  Pt denies pih symptoms.  Pt denies abdominal trauma

## 2012-12-18 ENCOUNTER — Inpatient Hospital Stay (HOSPITAL_COMMUNITY)
Admission: AD | Admit: 2012-12-18 | Discharge: 2012-12-18 | Disposition: A | Discharge status: Home / Self Care | Payer: Medicaid Other | Source: Ambulatory Visit | Attending: Obstetrics and Gynecology | Admitting: Obstetrics and Gynecology

## 2012-12-18 ENCOUNTER — Encounter (HOSPITAL_COMMUNITY): Payer: Self-pay | Admitting: *Deleted

## 2012-12-18 DIAGNOSIS — J069 Acute upper respiratory infection, unspecified: Secondary | ICD-10-CM | POA: Insufficient documentation

## 2012-12-18 DIAGNOSIS — O99891 Other specified diseases and conditions complicating pregnancy: Secondary | ICD-10-CM | POA: Insufficient documentation

## 2012-12-18 DIAGNOSIS — O212 Late vomiting of pregnancy: Secondary | ICD-10-CM | POA: Insufficient documentation

## 2012-12-18 HISTORY — DX: Unspecified asthma, uncomplicated: J45.909

## 2012-12-18 LAB — COMPREHENSIVE METABOLIC PANEL
ALT: 13 U/L (ref 0–35)
AST: 26 U/L (ref 0–37)
Albumin: 2.5 g/dL — ABNORMAL LOW (ref 3.5–5.2)
Alkaline Phosphatase: 68 U/L (ref 39–117)
CO2: 24 mEq/L (ref 19–32)
Chloride: 106 mEq/L (ref 96–112)
Creatinine, Ser: 0.59 mg/dL (ref 0.50–1.10)
GFR calc non Af Amer: >90 mL/min (ref >90)
Potassium: 3.8 mEq/L (ref 3.5–5.1)
Sodium: 137 mEq/L (ref 135–145)
Total Bilirubin: 0.2 mg/dL — ABNORMAL LOW (ref 0.3–1.2)

## 2012-12-18 LAB — CBC
MCV: 79.8 fL (ref 78.0–100.0)
Platelets: 154 10*3/uL (ref 150–400)
RBC: 3.76 MIL/uL — ABNORMAL LOW (ref 3.87–5.11)
RDW: 14.2 % (ref 11.5–15.5)
WBC: 9.7 10*3/uL (ref 4.0–10.5)

## 2012-12-18 MED ORDER — GUAIFENESIN ER 600 MG PO TB12: 1200.0000 mg | ORAL_TABLET | Freq: Two times a day (BID) | ORAL | Status: DC

## 2012-12-18 MED ORDER — GUAIFENESIN ER 600 MG PO TB12
1200.0000 mg | ORAL_TABLET | Freq: Two times a day (BID) | ORAL | Status: DC
  Administered 2012-12-18: 1200 mg via ORAL
  Filled 2012-12-18: qty 2

## 2012-12-18 NOTE — MAU Note (Signed)
States she was diagnosed with asthma, but does not think she has it anymore because she has not had any problems. States she has SOB on exertion. Has to sit down and rest sometimes.

## 2012-12-18 NOTE — MAU Provider Note (Signed)
History     CSN: [id number]  Arrival date and time: 12/18/12 4098   First Provider Initiated Contact with Patient 12/18/12 [phone]      Chief Complaint  Patient presents with  . Shortness of Breath  . Dizziness   HPIpt is [redacted]w[redacted]d pregnant pt of [facility], being seen for Dr. Su Hilt.  Pt presents with SOB with exertion.Pt denies chest pain. Pt denies cough, sore throat, fever or chills.  Pt has nasal congestion.  Pt denies abd pain or cramping, vaginal Discharge or spotting/bleeding., Pt has not taken anything for her sx.  Pt has hx of asthma and has an inhaler but has not used.  RN Note: Arvil Persons, RN Registered Nurse Signed  MAU Note Service date: 12/18/2012 8:49 AM   States she was diagnosed with asthma, but does not think she has it anymore because she has not had any problems. States she has SOB on exertion. Has to sit down and rest sometimes.     Past Medical History  Diagnosis Date  . Hypertension   . Ovarian cyst   . Abnormal Pap smear     colpo, ok since  . Chlamydia   . Headache(784.0)     OTC meds prn  . Lower back pain   . Leg pain   . Asthma     Past Surgical History  Procedure Laterality Date  . Cesarean section      x 2  . Oophrectomy  2007    Right , d/t cyst  . Diagnostic laparoscopy  2007  . Dilation and evacuation  10/27/2011    Procedure: DILATATION AND EVACUATION;  Surgeon: Michael Litter, MD;  Location: [facility];  Service: Gynecology;  Laterality: N/A;  . Dilation and evacuation  12/04/2011    Procedure: DILATATION AND EVACUATION;  Surgeon: Michael Litter, MD;  Location: [facility];  Service: Gynecology;  Laterality: N/A;    Family History  Problem Relation Age of Onset  . Hypertension Mother   . Hypertension Father   . Other Neg Hx   . Diabetes Paternal Aunt   . Hypertension Maternal Grandmother   . Cancer Maternal Grandmother   . Hypertension Maternal Grandfather   . Hypertension Paternal Grandmother   . Hypertension Paternal Grandfather    . Mental retardation Maternal Uncle     History  Substance Use Topics  . Smoking status: Former Smoker -- 1.00 packs/day for 4 years    Types: Cigarettes    Quit date: 07/21/2011  . Smokeless tobacco: Never Used     Comment: quit with pos preg  . Alcohol Use: No    Allergies: No Known Allergies  Prescriptions prior to admission  Medication Sig Dispense Refill  . Doxylamine-Pyridoxine (DICLEGIS PO) Take 2 capsules by mouth at bedtime.      . Prenatal Vit-Fe Fumarate-FA (PRENATAL MULTIVITAMIN) TABS Take 1 tablet by mouth every morning.        Review of Systems  Constitutional: Negative for fever and chills.  Respiratory: Positive for shortness of breath. Negative for cough and wheezing.   Cardiovascular: Negative for chest pain.  Gastrointestinal: Negative for heartburn, nausea, vomiting, abdominal pain and diarrhea.  Genitourinary: Negative for dysuria, urgency and frequency.   Physical Exam   Blood pressure 120/65, pulse 78, temperature 99.1 F (37.3 C), temperature source Oral, resp. rate 20, height 5\' 1"  (1.549 m), weight 81.194 kg (179 lb), last menstrual period 06/20/2012, SpO2 100.00%.  Physical Exam  Nursing note and vitals reviewed. Constitutional: She  is oriented to person, place, and time. She appears well-developed and well-nourished. No distress.  HENT:  Head: Normocephalic.  Nasal congestion  Eyes: Pupils are equal, round, and reactive to light.  Neck: Normal range of motion. Neck supple.  Cardiovascular: Normal rate, regular rhythm and normal heart sounds.   SpO2(%) 100  Respiratory: Effort normal and breath sounds normal. No respiratory distress. She has no wheezes. She has no rales.  GI: Soft. She exhibits no distension. There is no tenderness.  FHR 145 bpm with 6-25 bpm variability  Musculoskeletal: Normal range of motion.  Neurological: She is alert and oriented to person, place, and time.  Skin: Skin is warm and dry.  Psychiatric: She has a normal  mood and affect.    MAU Course  Procedures Discussed with Dr. Su Hilt- will treat for URI/congestion CBC/CMET for baseline per Dr. Su Hilt Results for orders placed during the hospital encounter of 12/18/12 (from the past 24 hour(s))  CBC     Status: Abnormal   Collection Time    12/18/12  9:55 AM      Result Value Range   WBC 9.7  4.0 - 10.5 K/uL   RBC 3.76 (*) 3.87 - 5.11 MIL/uL   Hemoglobin 10.1 (*) 12.0 - 15.0 g/dL   HCT 56.2 (*) 13.0 - 86.5 %   MCV 79.8  78.0 - 100.0 fL   MCH 26.9  26.0 - 34.0 pg   MCHC 33.7  30.0 - 36.0 g/dL   RDW 78.4  69.6 - 29.5 %   Platelets 154  150 - 400 K/uL  COMPREHENSIVE METABOLIC PANEL     Status: Abnormal   Collection Time    12/18/12  9:55 AM      Result Value Range   Sodium 137  135 - 145 mEq/L   Potassium 3.8  3.5 - 5.1 mEq/L   Chloride 106  96 - 112 mEq/L   CO2 24  19 - 32 mEq/L   Glucose, Bld 83  70 - 99 mg/dL   BUN 7  6 - 23 mg/dL   Creatinine, Ser 2.84  0.50 - 1.10 mg/dL   Calcium 8.4  8.4 - 13.2 mg/dL   Total Protein 6.0  6.0 - 8.3 g/dL   Albumin 2.5 (*) 3.5 - 5.2 g/dL   AST 26  0 - 37 U/L   ALT 13  0 - 35 U/L   Alkaline Phosphatase 68  39 - 117 U/L   Total Bilirubin 0.2 (*) 0.3 - 1.2 mg/dL   GFR calc non Af Amer >90  >90 mL/min   GFR calc Af Amer >90  >90 mL/min   Assessment and Plan  URI- Mucinex, Tylenol Cold/Sinus prn Report fever/chills/nausea/vomiting F/u for OB appointment as scheduled- sooner if increase in sx  Demario Faniel 12/18/2012, 9:26 AM

## 2012-12-18 NOTE — MAU Note (Signed)
States just getting over a cold (voice still sounds like has sinus congestion).  States has been feeling sob since last Tues.  Did not call office.

## 2012-12-24 IMAGING — US US PELVIS COMPLETE
1 series · 13 of 25 positions shown · non-contrast
Comparison: 10/25/2011

CLINICAL DATA: Recent D E for miscarriage.  Persistent vaginal
bleeding.  Previous right oophrectomy.  BHCG level of 11.



[Series 1: us pelvis complete · 57 acquisitions, 13 frames shown]
[im 1/57]
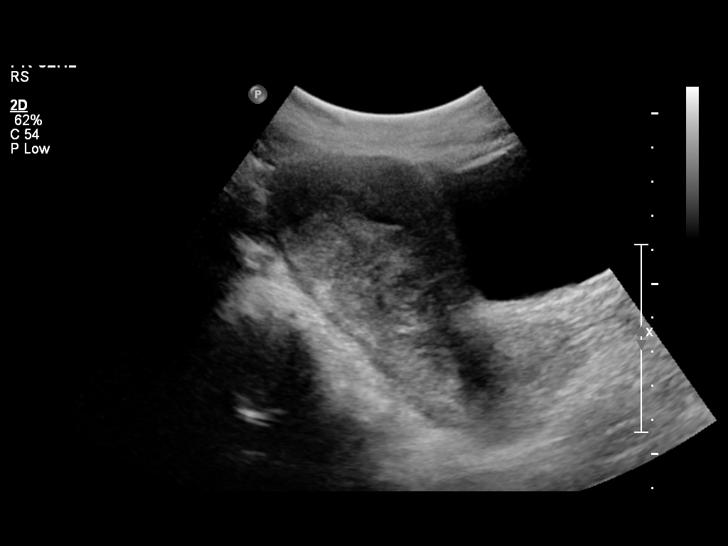
[im 5/57]
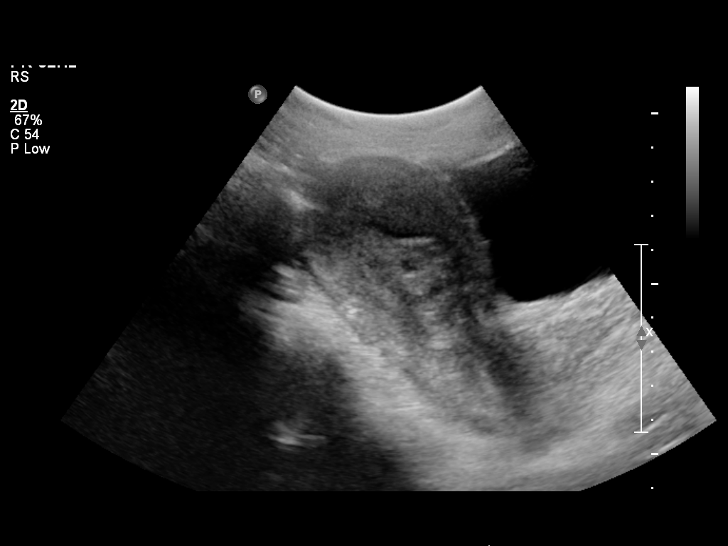
[im 10/57]
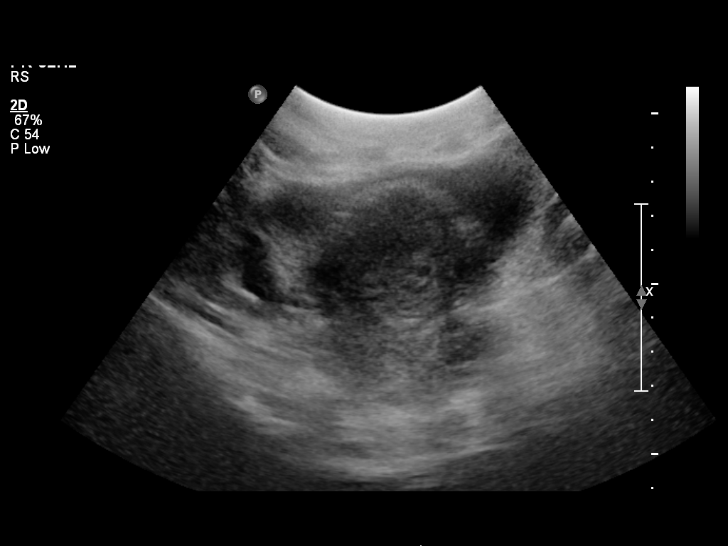
[im 15/57]
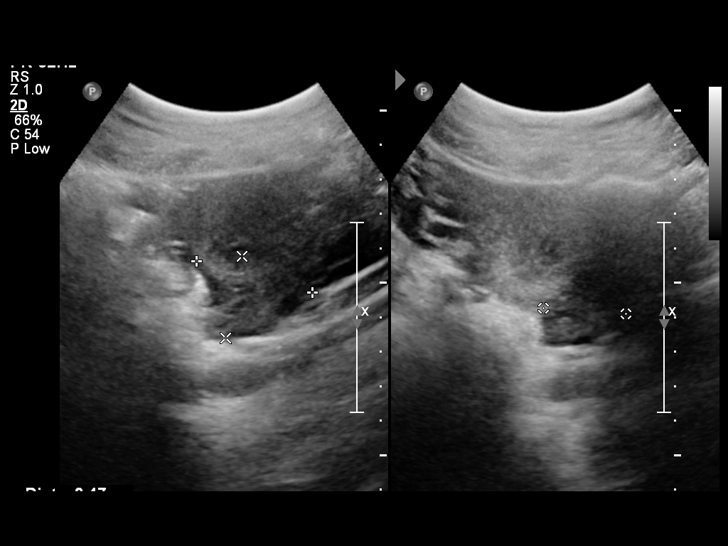
[im 19/57]
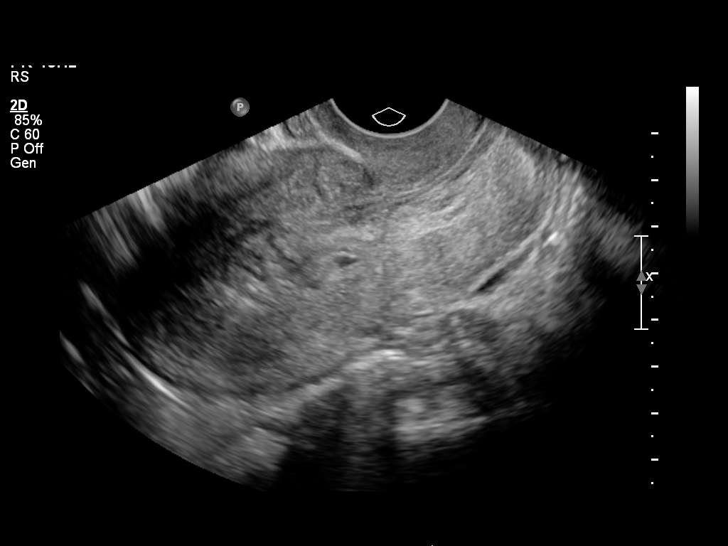
[im 24/57]
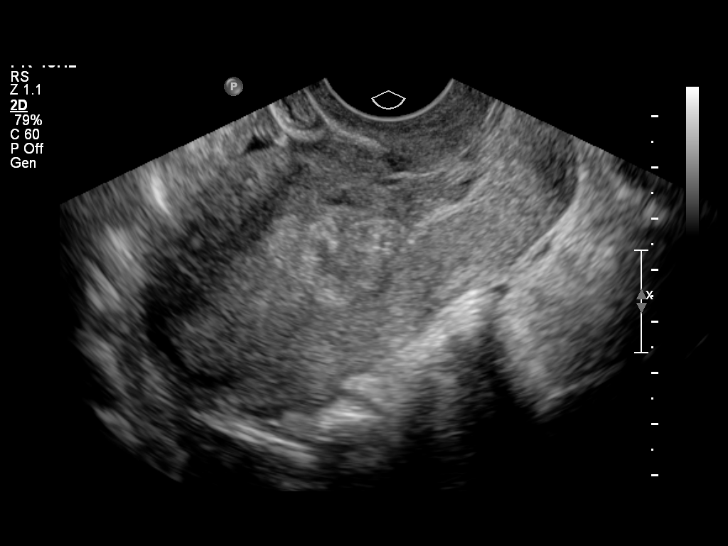
[im 29/57]
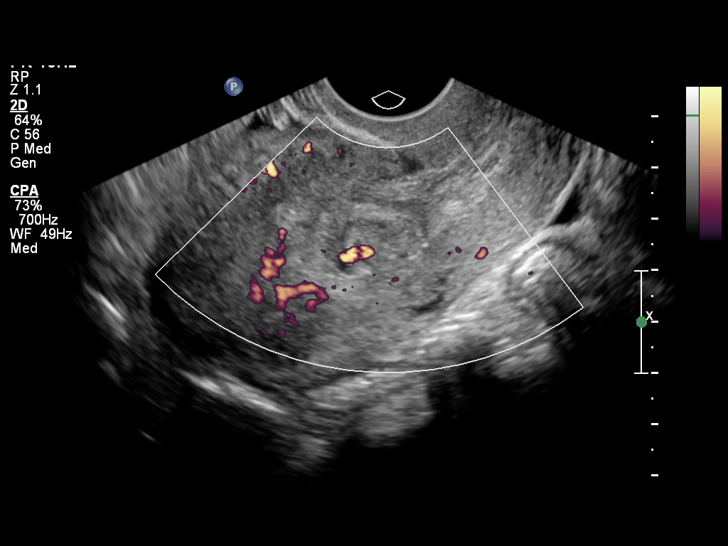
[im 33/57]
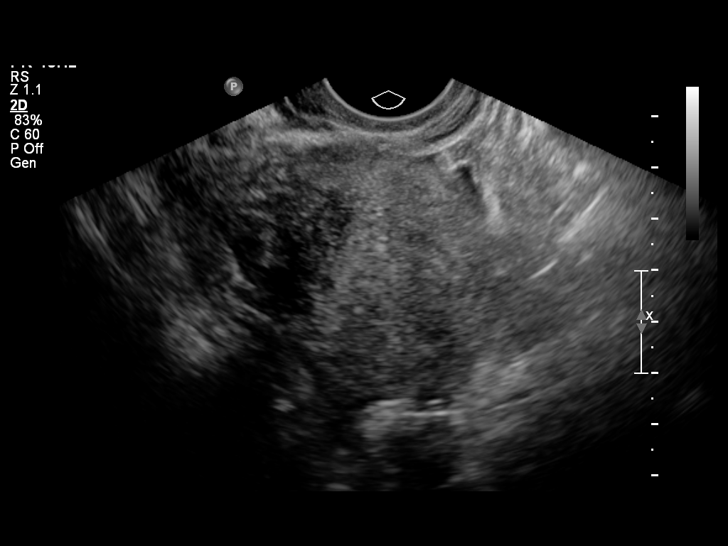
[im 38/57]
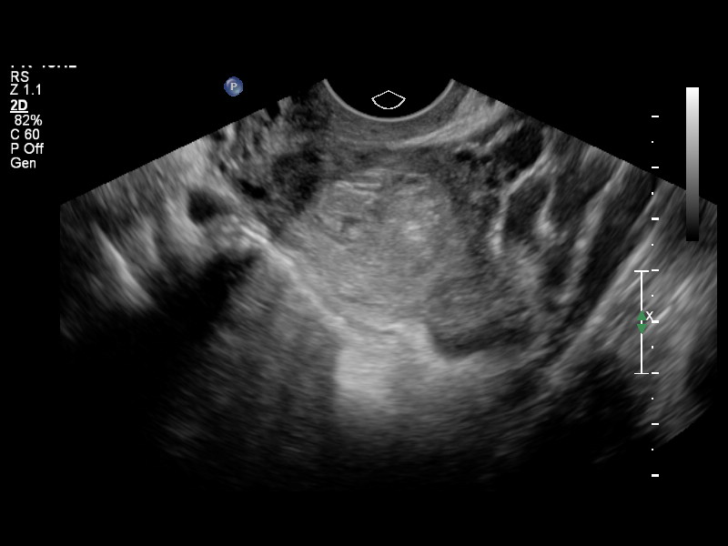
[im 43/57]
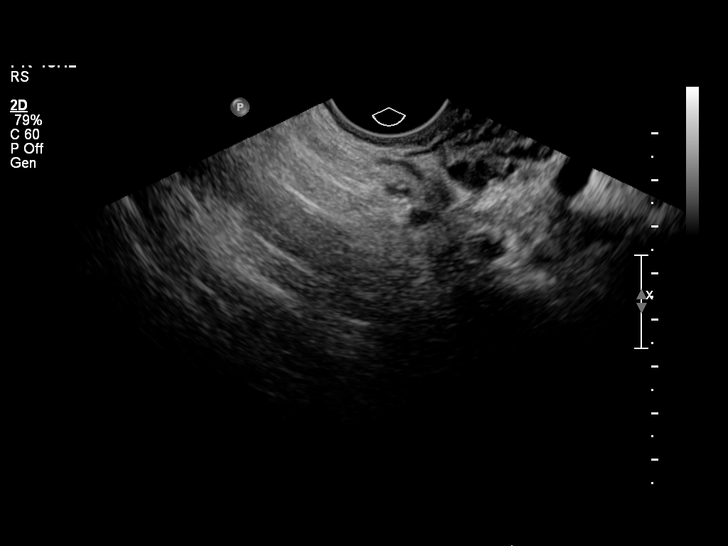
[im 47/57]
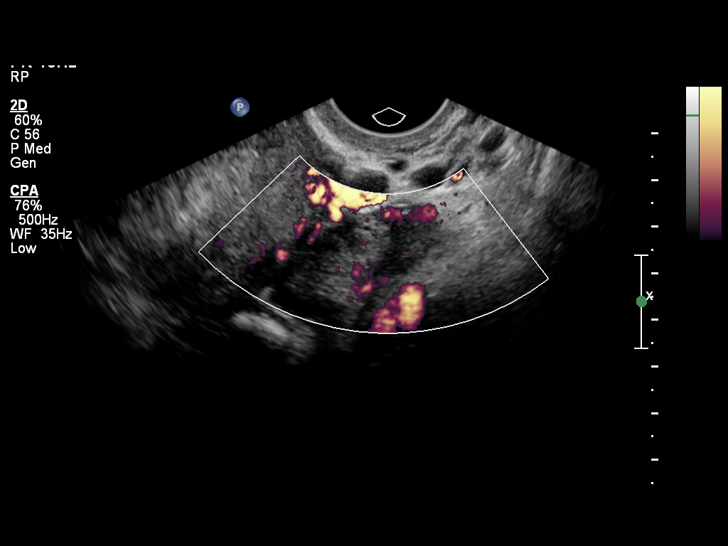
[im 52/57]
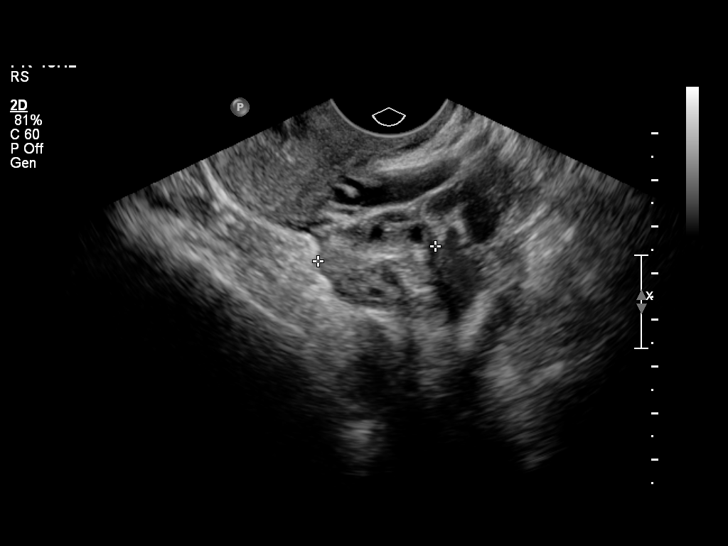
[im 57/57]
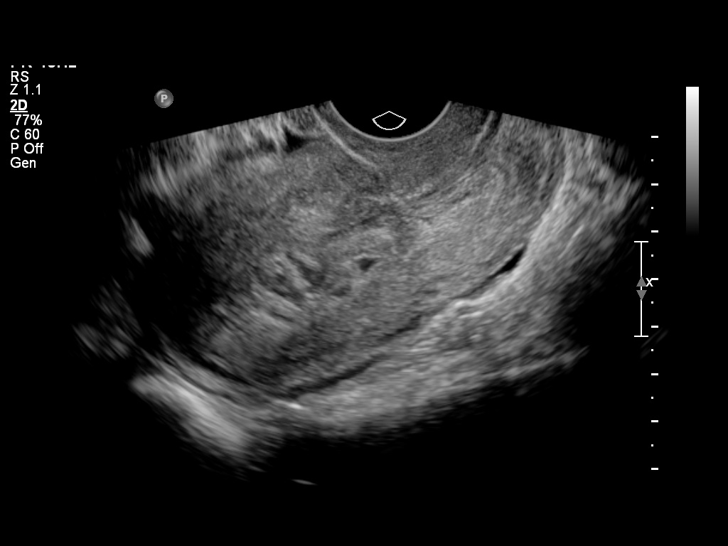

[13 of 25 positions shown; findings below may reference images not displayed]

FINDINGS: Uterus:  10.0 x 4.7 x 5.3 cm.  No fibroids identified.

Endometrium: A poorly defined heterogeneous hyperechoic mass is
seen centrally within the Endo meter cavity which contains several
cystic foci.  Some blood flow is seen within this mass on color
Doppler ultrasound.  This measures approximately 2 x 3 cm and is
consistent with retained products of conception.

Right ovary: Surgically absent.  No adnexal mass identified.

Left ovary: 4.2 x 2.4 x 2.5 cm.  Normal appearance.

Other Findings:  Tiny amount of free fluid and pelvic cul-de-sac.
IMPRESSION: 1. 2 x 3 cm heterogeneous hyperechoic mass within the central
endometrial cavity, consistent with retained products of
conception.
2.  No adnexal mass identified.

## 2013-02-27 ENCOUNTER — Inpatient Hospital Stay (HOSPITAL_COMMUNITY)
Admission: AD | Admit: 2013-02-27 | Discharge: 2013-03-02 | DRG: 765 | Disposition: A | Discharge status: Home / Self Care | Payer: Medicaid Other | Source: Ambulatory Visit | Attending: Obstetrics and Gynecology | Admitting: Obstetrics and Gynecology

## 2013-02-27 ENCOUNTER — Encounter (HOSPITAL_COMMUNITY)
Admission: AD | Disposition: A | Discharge status: Home / Self Care | Payer: Self-pay | Source: Ambulatory Visit | Attending: Obstetrics and Gynecology

## 2013-02-27 ENCOUNTER — Inpatient Hospital Stay (HOSPITAL_COMMUNITY): Payer: Medicaid Other | Admitting: Anesthesiology

## 2013-02-27 ENCOUNTER — Encounter (HOSPITAL_COMMUNITY): Payer: Self-pay | Admitting: General Practice

## 2013-02-27 ENCOUNTER — Encounter (HOSPITAL_COMMUNITY): Payer: Medicaid Other | Admitting: Anesthesiology

## 2013-02-27 DIAGNOSIS — G43909 Migraine, unspecified, not intractable, without status migrainosus: Secondary | ICD-10-CM | POA: Diagnosis not present

## 2013-02-27 DIAGNOSIS — Z302 Encounter for sterilization: Secondary | ICD-10-CM

## 2013-02-27 DIAGNOSIS — M542 Cervicalgia: Secondary | ICD-10-CM | POA: Diagnosis not present

## 2013-02-27 DIAGNOSIS — O9903 Anemia complicating the puerperium: Secondary | ICD-10-CM | POA: Diagnosis not present

## 2013-02-27 DIAGNOSIS — Z87891 Personal history of nicotine dependence: Secondary | ICD-10-CM | POA: Diagnosis not present

## 2013-02-27 DIAGNOSIS — O34219 Maternal care for unspecified type scar from previous cesarean delivery: Secondary | ICD-10-CM | POA: Diagnosis present

## 2013-02-27 DIAGNOSIS — O99892 Other specified diseases and conditions complicating childbirth: Secondary | ICD-10-CM | POA: Diagnosis present

## 2013-02-27 DIAGNOSIS — D649 Anemia, unspecified: Secondary | ICD-10-CM | POA: Diagnosis not present

## 2013-02-27 DIAGNOSIS — N736 Female pelvic peritoneal adhesions (postinfective): Secondary | ICD-10-CM | POA: Diagnosis present

## 2013-02-27 DIAGNOSIS — O9989 Other specified diseases and conditions complicating pregnancy, childbirth and the puerperium: Secondary | ICD-10-CM

## 2013-02-27 DIAGNOSIS — O4700 False labor before 37 completed weeks of gestation, unspecified trimester: Secondary | ICD-10-CM | POA: Diagnosis present

## 2013-02-27 LAB — CBC
HEMATOCRIT: 32.8 % — AB (ref 36.0–46.0)
Hemoglobin: 11.2 g/dL — ABNORMAL LOW (ref 12.0–15.0)
MCH: 27.1 pg (ref 26.0–34.0)
MCHC: 34.1 g/dL (ref 30.0–36.0)
MCV: 79.4 fL (ref 78.0–100.0)
Platelets: 164 10*3/uL (ref 150–400)
RBC: 4.13 MIL/uL (ref 3.87–5.11)
RDW: 15.3 % (ref 11.5–15.5)
WBC: 13.1 10*3/uL — ABNORMAL HIGH (ref 4.0–10.5)

## 2013-02-27 LAB — TYPE AND SCREEN
ABO/RH(D): O POS
ANTIBODY SCREEN: NEGATIVE

## 2013-02-27 LAB — RPR: RPR Ser Ql: NONREACTIVE

## 2013-02-27 SURGERY — Surgical Case
Anesthesia: Spinal | Site: Abdomen

## 2013-02-27 MED ORDER — LACTATED RINGERS IV SOLN
INTRAVENOUS | Status: DC
  Administered 2013-02-27 (×3): via INTRAVENOUS

## 2013-02-27 MED ORDER — DIBUCAINE 1 % RE OINT
1.0000 "application " | TOPICAL_OINTMENT | RECTAL | Status: DC | PRN
  Filled 2013-02-27: qty 28

## 2013-02-27 MED ORDER — ACETAMINOPHEN 500 MG PO TABS: 1000.0000 mg | ORAL_TABLET | Freq: Four times a day (QID) | ORAL | Status: AC

## 2013-02-27 MED ORDER — NALBUPHINE HCL 10 MG/ML IJ SOLN
5.0000 mg | INTRAMUSCULAR | Status: DC | PRN
  Filled 2013-02-27: qty 1

## 2013-02-27 MED ORDER — TETANUS-DIPHTH-ACELL PERTUSSIS 5-2.5-18.5 LF-MCG/0.5 IM SUSP: 0.5000 mL | Freq: Once | INTRAMUSCULAR | Status: DC

## 2013-02-27 MED ORDER — ONDANSETRON HCL 4 MG PO TABS: 4.0000 mg | ORAL_TABLET | ORAL | Status: DC | PRN

## 2013-02-27 MED ORDER — CEFAZOLIN SODIUM-DEXTROSE 2-3 GM-% IV SOLR
2.0000 g | Freq: Once | INTRAVENOUS | Status: AC
  Administered 2013-02-27: 2 g via INTRAVENOUS
  Filled 2013-02-27: qty 50

## 2013-02-27 MED ORDER — LANOLIN HYDROUS EX OINT: 1.0000 "application " | TOPICAL_OINTMENT | CUTANEOUS | Status: DC | PRN

## 2013-02-27 MED ORDER — NALOXONE HCL 1 MG/ML IJ SOLN: 1.0000 ug/kg/h | INTRAVENOUS | Status: DC | PRN

## 2013-02-27 MED ORDER — FERROUS SULFATE 325 (65 FE) MG PO TABS
325.0000 mg | ORAL_TABLET | Freq: Two times a day (BID) | ORAL | Status: DC
  Administered 2013-02-28 – 2013-03-01 (×3): 325 mg via ORAL
  Filled 2013-02-27 (×6): qty 1

## 2013-02-27 MED ORDER — MORPHINE SULFATE 0.5 MG/ML IJ SOLN
INTRAMUSCULAR | Status: AC
  Filled 2013-02-27: qty 10

## 2013-02-27 MED ORDER — SODIUM CHLORIDE 0.9 % IJ SOLN: 3.0000 mL | INTRAMUSCULAR | Status: DC | PRN

## 2013-02-27 MED ORDER — DIPHENHYDRAMINE HCL 25 MG PO CAPS: 25.0000 mg | ORAL_CAPSULE | ORAL | Status: DC | PRN

## 2013-02-27 MED ORDER — PHENYLEPHRINE HCL 10 MG/ML IJ SOLN
INTRAMUSCULAR | Status: AC
  Filled 2013-02-27: qty 1

## 2013-02-27 MED ORDER — HYDROMORPHONE HCL PF 1 MG/ML IJ SOLN
INTRAMUSCULAR | Status: AC
  Administered 2013-02-27: 0.5 mg via INTRAVENOUS
  Filled 2013-02-27: qty 1

## 2013-02-27 MED ORDER — FENTANYL CITRATE 0.05 MG/ML IJ SOLN: 25.0000 ug | INTRAMUSCULAR | Status: DC | PRN

## 2013-02-27 MED ORDER — SCOPOLAMINE 1 MG/3DAYS TD PT72: 1.0000 | MEDICATED_PATCH | Freq: Once | TRANSDERMAL | Status: DC

## 2013-02-27 MED ORDER — NALOXONE HCL 1 MG/ML IJ SOLN
1.0000 ug/kg/h | INTRAVENOUS | Status: DC | PRN
  Filled 2013-02-27: qty 2

## 2013-02-27 MED ORDER — KETOROLAC TROMETHAMINE 30 MG/ML IJ SOLN: 30.0000 mg | Freq: Four times a day (QID) | INTRAMUSCULAR | Status: DC | PRN

## 2013-02-27 MED ORDER — ONDANSETRON HCL 4 MG/2ML IJ SOLN: 4.0000 mg | INTRAMUSCULAR | Status: DC | PRN

## 2013-02-27 MED ORDER — MEPERIDINE HCL 25 MG/ML IJ SOLN: 6.2500 mg | INTRAMUSCULAR | Status: DC | PRN

## 2013-02-27 MED ORDER — OXYTOCIN 10 UNIT/ML IJ SOLN
INTRAMUSCULAR | Status: AC
  Filled 2013-02-27: qty 4

## 2013-02-27 MED ORDER — SIMETHICONE 80 MG PO CHEW
80.0000 mg | CHEWABLE_TABLET | ORAL | Status: DC | PRN
  Filled 2013-02-27: qty 1

## 2013-02-27 MED ORDER — FAMOTIDINE IN NACL 20-0.9 MG/50ML-% IV SOLN
20.0000 mg | Freq: Once | INTRAVENOUS | Status: AC
  Administered 2013-02-27: 20 mg via INTRAVENOUS
  Filled 2013-02-27: qty 50

## 2013-02-27 MED ORDER — KETOROLAC TROMETHAMINE 30 MG/ML IJ SOLN: 30.0000 mg | Freq: Four times a day (QID) | INTRAMUSCULAR | Status: AC | PRN

## 2013-02-27 MED ORDER — DIPHENHYDRAMINE HCL 50 MG/ML IJ SOLN: 12.5000 mg | INTRAMUSCULAR | Status: DC | PRN

## 2013-02-27 MED ORDER — CITRIC ACID-SODIUM CITRATE 334-500 MG/5ML PO SOLN
ORAL | Status: AC
  Administered 2013-02-27: 30 mL
  Filled 2013-02-27: qty 15

## 2013-02-27 MED ORDER — IBUPROFEN 600 MG PO TABS: 600.0000 mg | ORAL_TABLET | Freq: Four times a day (QID) | ORAL | Status: DC | PRN

## 2013-02-27 MED ORDER — FENTANYL CITRATE 0.05 MG/ML IJ SOLN
INTRAMUSCULAR | Status: AC
  Filled 2013-02-27: qty 2

## 2013-02-27 MED ORDER — KETOROLAC TROMETHAMINE 30 MG/ML IJ SOLN
INTRAMUSCULAR | Status: AC
  Filled 2013-02-27: qty 1

## 2013-02-27 MED ORDER — DIPHENHYDRAMINE HCL 25 MG PO CAPS
25.0000 mg | ORAL_CAPSULE | Freq: Four times a day (QID) | ORAL | Status: DC | PRN
  Filled 2013-02-27: qty 1

## 2013-02-27 MED ORDER — IBUPROFEN 600 MG PO TABS
600.0000 mg | ORAL_TABLET | Freq: Four times a day (QID) | ORAL | Status: DC
  Administered 2013-02-27 – 2013-03-02 (×12): 600 mg via ORAL
  Filled 2013-02-27 (×12): qty 1

## 2013-02-27 MED ORDER — PHENYLEPHRINE 8 MG IN D5W 100 ML (0.08MG/ML) PREMIX OPTIME
INJECTION | INTRAVENOUS | Status: DC | PRN
  Administered 2013-02-27: 60 ug/min via INTRAVENOUS

## 2013-02-27 MED ORDER — ONDANSETRON HCL 4 MG/2ML IJ SOLN
INTRAMUSCULAR | Status: DC | PRN
  Administered 2013-02-27: 4 mg via INTRAVENOUS

## 2013-02-27 MED ORDER — DIPHENHYDRAMINE HCL 50 MG/ML IJ SOLN: 25.0000 mg | INTRAMUSCULAR | Status: DC | PRN

## 2013-02-27 MED ORDER — OXYCODONE-ACETAMINOPHEN 5-325 MG PO TABS
1.0000 | ORAL_TABLET | ORAL | Status: DC | PRN
  Administered 2013-02-28 – 2013-03-02 (×8): 2 via ORAL
  Administered 2013-03-02: 1 via ORAL
  Administered 2013-03-02: 2 via ORAL
  Filled 2013-02-27 (×10): qty 2

## 2013-02-27 MED ORDER — SIMETHICONE 80 MG PO CHEW
80.0000 mg | CHEWABLE_TABLET | ORAL | Status: DC
  Administered 2013-02-27 – 2013-03-01 (×3): 80 mg via ORAL
  Filled 2013-02-27 (×5): qty 1

## 2013-02-27 MED ORDER — BISACODYL 10 MG RE SUPP
10.0000 mg | Freq: Every day | RECTAL | Status: DC | PRN
  Administered 2013-03-01: 10 mg via RECTAL
  Filled 2013-02-27 (×2): qty 1

## 2013-02-27 MED ORDER — DIPHENHYDRAMINE HCL 25 MG PO CAPS
25.0000 mg | ORAL_CAPSULE | ORAL | Status: DC | PRN
  Filled 2013-02-27: qty 1

## 2013-02-27 MED ORDER — SIMETHICONE 80 MG PO CHEW
80.0000 mg | CHEWABLE_TABLET | Freq: Three times a day (TID) | ORAL | Status: DC
  Administered 2013-02-28 – 2013-03-02 (×9): 80 mg via ORAL
  Filled 2013-02-27 (×13): qty 1

## 2013-02-27 MED ORDER — PNEUMOCOCCAL VAC POLYVALENT 25 MCG/0.5ML IJ INJ
0.5000 mL | INJECTION | INTRAMUSCULAR | Status: AC
  Administered 2013-02-28: 0.5 mL via INTRAMUSCULAR
  Filled 2013-02-27: qty 0.5

## 2013-02-27 MED ORDER — BUPIVACAINE IN DEXTROSE 0.75-8.25 % IT SOLN
INTRATHECAL | Status: DC | PRN
  Administered 2013-02-27: 1.5 mL via INTRATHECAL

## 2013-02-27 MED ORDER — LACTATED RINGERS IV SOLN
INTRAVENOUS | Status: DC
  Administered 2013-02-27: 22:00:00 via INTRAVENOUS

## 2013-02-27 MED ORDER — NALOXONE HCL 0.4 MG/ML IJ SOLN: 0.4000 mg | INTRAMUSCULAR | Status: DC | PRN

## 2013-02-27 MED ORDER — ONDANSETRON HCL 4 MG/2ML IJ SOLN: 4.0000 mg | Freq: Three times a day (TID) | INTRAMUSCULAR | Status: DC | PRN

## 2013-02-27 MED ORDER — SENNOSIDES-DOCUSATE SODIUM 8.6-50 MG PO TABS
2.0000 | ORAL_TABLET | ORAL | Status: DC
  Administered 2013-02-27 – 2013-03-01 (×3): 2 via ORAL
  Filled 2013-02-27 (×5): qty 2

## 2013-02-27 MED ORDER — ACETAMINOPHEN 500 MG PO TABS: 1000.0000 mg | ORAL_TABLET | Freq: Four times a day (QID) | ORAL | Status: DC

## 2013-02-27 MED ORDER — FENTANYL CITRATE 0.05 MG/ML IJ SOLN
INTRAMUSCULAR | Status: DC | PRN
  Administered 2013-02-27: 25 ug via INTRATHECAL

## 2013-02-27 MED ORDER — FLEET ENEMA 7-19 GM/118ML RE ENEM: 1.0000 | ENEMA | Freq: Every day | RECTAL | Status: DC | PRN

## 2013-02-27 MED ORDER — MENTHOL 3 MG MT LOZG
1.0000 | LOZENGE | OROMUCOSAL | Status: DC | PRN
  Filled 2013-02-27: qty 9

## 2013-02-27 MED ORDER — MIDAZOLAM HCL 2 MG/2ML IJ SOLN: 0.5000 mg | Freq: Once | INTRAMUSCULAR | Status: DC | PRN

## 2013-02-27 MED ORDER — METOCLOPRAMIDE HCL 5 MG/ML IJ SOLN: 10.0000 mg | Freq: Three times a day (TID) | INTRAMUSCULAR | Status: DC | PRN

## 2013-02-27 MED ORDER — HYDROMORPHONE HCL PF 1 MG/ML IJ SOLN
0.5000 mg | Freq: Once | INTRAMUSCULAR | Status: AC
  Administered 2013-02-27: 0.5 mg via INTRAVENOUS

## 2013-02-27 MED ORDER — MORPHINE SULFATE (PF) 0.5 MG/ML IJ SOLN
INTRAMUSCULAR | Status: DC | PRN
  Administered 2013-02-27: .15 mg via INTRATHECAL

## 2013-02-27 MED ORDER — ZOLPIDEM TARTRATE 5 MG PO TABS: 5.0000 mg | ORAL_TABLET | Freq: Every evening | ORAL | Status: DC | PRN

## 2013-02-27 MED ORDER — LACTATED RINGERS IV SOLN
INTRAVENOUS | Status: DC | PRN
  Administered 2013-02-27 (×2): via INTRAVENOUS

## 2013-02-27 MED ORDER — OXYTOCIN 10 UNIT/ML IJ SOLN
40.0000 [IU] | INTRAVENOUS | Status: DC | PRN
  Administered 2013-02-27: 40 [IU] via INTRAVENOUS

## 2013-02-27 MED ORDER — NALBUPHINE HCL 10 MG/ML IJ SOLN
5.0000 mg | INTRAMUSCULAR | Status: DC | PRN
  Administered 2013-02-27: 10 mg via INTRAVENOUS
  Filled 2013-02-27: qty 1

## 2013-02-27 MED ORDER — PRENATAL MULTIVITAMIN CH
1.0000 | ORAL_TABLET | Freq: Every day | ORAL | Status: DC
  Administered 2013-02-28 – 2013-03-02 (×3): 1 via ORAL
  Filled 2013-02-27 (×3): qty 1

## 2013-02-27 MED ORDER — ONDANSETRON HCL 4 MG/2ML IJ SOLN
INTRAMUSCULAR | Status: AC
  Filled 2013-02-27: qty 2

## 2013-02-27 MED ORDER — MEASLES, MUMPS & RUBELLA VAC ~~LOC~~ INJ
0.5000 mL | INJECTION | Freq: Once | SUBCUTANEOUS | Status: DC
  Filled 2013-02-27: qty 0.5

## 2013-02-27 MED ORDER — SCOPOLAMINE 1 MG/3DAYS TD PT72
MEDICATED_PATCH | TRANSDERMAL | Status: AC
  Filled 2013-02-27: qty 1

## 2013-02-27 MED ORDER — WITCH HAZEL-GLYCERIN EX PADS: 1.0000 "application " | MEDICATED_PAD | CUTANEOUS | Status: DC | PRN

## 2013-02-27 MED ORDER — KETOROLAC TROMETHAMINE 30 MG/ML IJ SOLN
30.0000 mg | Freq: Four times a day (QID) | INTRAMUSCULAR | Status: DC | PRN
  Administered 2013-02-27: 30 mg via INTRAVENOUS

## 2013-02-27 MED ORDER — PROMETHAZINE HCL 25 MG/ML IJ SOLN: 6.2500 mg | INTRAMUSCULAR | Status: DC | PRN

## 2013-02-27 MED ORDER — OXYTOCIN 40 UNITS IN LACTATED RINGERS INFUSION - SIMPLE MED: 62.5000 mL/h | INTRAVENOUS | Status: AC

## 2013-02-27 MED ORDER — SCOPOLAMINE 1 MG/3DAYS TD PT72
1.0000 | MEDICATED_PATCH | Freq: Once | TRANSDERMAL | Status: DC
  Filled 2013-02-27: qty 1

## 2013-02-27 SURGICAL SUPPLY — 39 items
BENZOIN TINCTURE PRP APPL 2/3 (GAUZE/BANDAGES/DRESSINGS) ×3 IMPLANT
CLAMP CORD UMBIL (MISCELLANEOUS) IMPLANT
CLOSURE WOUND 1/2 X4 (GAUZE/BANDAGES/DRESSINGS) ×1
CLOTH BEACON ORANGE TIMEOUT ST (SAFETY) ×3 IMPLANT
CONTAINER PREFILL 10% NBF 15ML (MISCELLANEOUS) IMPLANT
DRAIN JACKSON PRT FLT 10 (DRAIN) IMPLANT
DRAPE LG THREE QUARTER DISP (DRAPES) IMPLANT
DRSG OPSITE POSTOP 4X10 (GAUZE/BANDAGES/DRESSINGS) ×3 IMPLANT
DURAPREP 26ML APPLICATOR (WOUND CARE) ×3 IMPLANT
ELECT REM PT RETURN 9FT ADLT (ELECTROSURGICAL) ×3
ELECTRODE REM PT RTRN 9FT ADLT (ELECTROSURGICAL) ×1 IMPLANT
EVACUATOR SILICONE 100CC (DRAIN) IMPLANT
EXTRACTOR VACUUM M CUP 4 TUBE (SUCTIONS) IMPLANT
EXTRACTOR VACUUM M CUP 4' TUBE (SUCTIONS)
GLOVE BIO SURGEON STRL SZ 6.5 (GLOVE) ×2 IMPLANT
GLOVE BIO SURGEONS STRL SZ 6.5 (GLOVE) ×1
GLOVE BIOGEL PI IND STRL 7.0 (GLOVE) ×1 IMPLANT
GLOVE BIOGEL PI INDICATOR 7.0 (GLOVE) ×2
GOWN PREVENTION PLUS XLARGE (GOWN DISPOSABLE) ×3 IMPLANT
GOWN STRL REIN XL XLG (GOWN DISPOSABLE) ×3 IMPLANT
KIT ABG SYR 3ML LUER SLIP (SYRINGE) IMPLANT
NEEDLE HYPO 25X5/8 SAFETYGLIDE (NEEDLE) IMPLANT
NS IRRIG 1000ML POUR BTL (IV SOLUTION) ×3 IMPLANT
PACK C SECTION WH (CUSTOM PROCEDURE TRAY) ×3 IMPLANT
PAD OB MATERNITY 4.3X12.25 (PERSONAL CARE ITEMS) ×3 IMPLANT
RTRCTR C-SECT PINK 25CM LRG (MISCELLANEOUS) IMPLANT
STAPLER VISISTAT 35W (STAPLE) IMPLANT
STRIP CLOSURE SKIN 1/2X4 (GAUZE/BANDAGES/DRESSINGS) ×2 IMPLANT
SUT CHROMIC 0 CT 1 (SUTURE) ×3 IMPLANT
SUT MNCRL AB 3-0 PS2 27 (SUTURE) ×3 IMPLANT
SUT PLAIN 2 0 (SUTURE) ×4
SUT PLAIN 2 0 XLH (SUTURE) ×3 IMPLANT
SUT PLAIN ABS 2-0 CT1 27XMFL (SUTURE) ×2 IMPLANT
SUT SILK 2 0 SH (SUTURE) IMPLANT
SUT VIC AB 0 CTX 36 (SUTURE) ×8
SUT VIC AB 0 CTX36XBRD ANBCTRL (SUTURE) ×4 IMPLANT
TOWEL OR 17X24 6PK STRL BLUE (TOWEL DISPOSABLE) ×3 IMPLANT
TRAY FOLEY CATH 14FR (SET/KITS/TRAYS/PACK) ×3 IMPLANT
WATER STERILE IRR 1000ML POUR (IV SOLUTION) ×3 IMPLANT

## 2013-02-27 NOTE — Anesthesia Procedure Notes (Signed)
Spinal  Patient location during procedure: OR Start time: 02/27/2013 2:22 PM Staffing Anesthesiologist: Rudean Curt Performed by: anesthesiologist  Preanesthetic Checklist Completed: patient identified, site marked, surgical consent, pre-op evaluation, timeout performed, IV checked, risks and benefits discussed and monitors and equipment checked Spinal Block Patient position: sitting Prep: DuraPrep Patient monitoring: heart rate, cardiac monitor, continuous pulse ox and blood pressure Approach: midline Location: L3-4 Injection technique: single-shot Needle Needle type: Sprotte  Needle gauge: 24 G Needle length: 9 cm Assessment Sensory level: T4 Additional Notes Patient identified.  Risk benefits discussed including failed block, incomplete pain control, headache, nerve damage, paralysis, blood pressure changes, nausea, vomiting, reactions to medication both toxic or allergic, and postpartum back pain.  Patient expressed understanding and wished to proceed.  All questions were answered.  Sterile technique used throughout procedure.  CSF was clear.  No parasthesia or other complications.  Please see nursing notes for vital signs.

## 2013-02-27 NOTE — Anesthesia Postprocedure Evaluation (Signed)
  Anesthesia Post Note  Patient: [name]  Procedure(s) Performed: Procedure(s) (LRB): CESAREAN SECTION with tubal ligation (N/A)  Anesthesia type: Spinal  Patient location: PACU  Post pain: Pain level controlled  Post assessment: Post-op Vital signs reviewed  Last Vitals:  Filed Vitals:   02/27/13 1725  BP:   Pulse:   Temp: 36.9 C  Resp:     Post vital signs: Reviewed  Level of consciousness: awake  Complications: No apparent anesthesia complications

## 2013-02-27 NOTE — H&P (Signed)
[name] is a 28 y.o. female presenting for preterm labor at 33 weeks. History OB History   Grav Para Term Preterm Abortions TAB SAB Ect Mult Living   5 3 3  0 1 0 1 0 0 3     Past Medical History  Diagnosis Date  . Hypertension   . Ovarian cyst   . Abnormal Pap smear     colpo, ok since  . Chlamydia   . Headache(784.0)     OTC meds prn  . Lower back pain   . Leg pain   . Asthma    Past Surgical History  Procedure Laterality Date  . Cesarean section      x 2  . Oophrectomy  2007    Right , d/t cyst  . Diagnostic laparoscopy  2007  . Dilation and evacuation  10/27/2011    Procedure: DILATATION AND EVACUATION;  Surgeon: Betsy Coder, MD;  Location: [facility];  Service: Gynecology;  Laterality: N/A;  . Dilation and evacuation  12/04/2011    Procedure: DILATATION AND EVACUATION;  Surgeon: Betsy Coder, MD;  Location: [facility];  Service: Gynecology;  Laterality: N/A;   Family History: family history includes Cancer in her maternal grandmother; Diabetes in her paternal aunt; Hypertension in her father, maternal grandfather, maternal grandmother, mother, paternal grandfather, and paternal grandmother; Mental retardation in her maternal uncle. There is no history of Other. Social History:  reports that she quit smoking about 19 months ago. Her smoking use included Cigarettes. She has a 4 pack-year smoking history. She has never used smokeless tobacco. She reports that she does not drink alcohol or use illicit drugs.   Prenatal Transfer Tool  Maternal Diabetes: No Genetic Screening: Normal Maternal Ultrasounds/Referrals: Normal Fetal Ultrasounds or other Referrals:  None Maternal Substance Abuse:  No Significant Maternal Medications:  None Significant Maternal Lab Results:  None Other Comments:  None  ROS    Blood pressure 125/61, pulse 95, temperature 97.9 F (36.6 C), temperature source Oral, resp. rate 20, height 5\' 1"  (1.549 m), weight 188 lb (85.276 kg), last  menstrual period 06/20/2012. Exam Physical Exam  Physical Examination: General appearance - alert, well appearing, and in no distress Mental status - alert, oriented to person, place, and time Physical Examination: Chest - clear to auscultation, no wheezes, rales or rhonchi, symmetric air entry Heart - normal rate, regular rhythm, normal S1, S2, no murmurs, rubs, clicks or gallops Abdomen - soft, nontender, nondistended, no masses or organomegaly And gravid Pelvic - normal external genitalia, vulva, vagina, cervix, uterus and adnexa, 4/80/-3 in the office Extremities - Homan's sign negative bilaterally  Prenatal labs: ABO, Rh:  see prenatal labs Antibody:   Rubella:   RPR:    HBsAg:    HIV:    GBS:     Assessment/Plan: Labor Pt desires repeat cesarean and tubal ligation. She states she may not have a right tube because of previous surgery Will wait on CBC Will proceed with CS before type and screen done because pt is in labor and want to prevent uterine rupture   [name] A 02/27/2013, 1:25 PM

## 2013-02-27 NOTE — Transfer of Care (Signed)
Immediate Anesthesia Transfer of Care Note  Patient: [name]  Procedure(s) Performed: Procedure(s): CESAREAN SECTION with tubal ligation (N/A)  Patient Location: PACU  Anesthesia Type:Spinal  Level of Consciousness: awake, alert , oriented and patient cooperative  Airway & Oxygen Therapy: Patient Spontanous Breathing  Post-op Assessment: Report given to PACU RN and Post -op Vital signs reviewed and stable  Post vital signs: Reviewed and stable  Complications: No apparent anesthesia complications

## 2013-02-27 NOTE — Anesthesia Preprocedure Evaluation (Signed)
Anesthesia Evaluation  Patient identified by MRN, date of birth, ID band Patient awake    Reviewed: Allergy & Precautions, H&P , NPO status , Patient's Chart, lab work & pertinent test results  Airway Mallampati: II      Dental   Pulmonary asthma , former smoker,  breath sounds clear to auscultation        Cardiovascular Exercise Tolerance: Good hypertension, Rhythm:regular Rate:Normal     Neuro/Psych  Headaches,    GI/Hepatic   Endo/Other    Renal/GU      Musculoskeletal   Abdominal   Peds  Hematology   Anesthesia Other Findings   Reproductive/Obstetrics (+) Pregnancy                           Anesthesia Physical Anesthesia Plan  ASA: III  Anesthesia Plan: Spinal   Post-op Pain Management:    Induction:   Airway Management Planned:   Additional Equipment:   Intra-op Plan:   Post-operative Plan:   Informed Consent: I have reviewed the patients History and Physical, chart, labs and discussed the procedure including the risks, benefits and alternatives for the proposed anesthesia with the patient or authorized representative who has indicated his/her understanding and acceptance.     Plan Discussed with: Anesthesiologist, CRNA and Surgeon  Anesthesia Plan Comments:         Anesthesia Quick Evaluation

## 2013-02-27 NOTE — Op Note (Signed)
Cesarean Section Procedure Note   [name]  02/27/2013  Indications: Scheduled Proceedure/Maternal Request and preterm labor at 36 weeks   Pre-operative Diagnosis: repeat c section preterm labor at 77 weeks desires sterilization.   Post-operative Diagnosis: Same   Surgeon [place]  Assistants: none  Anesthesia: spinal   Procedure Details:  The patient was seen in the Holding Room. The risks, benefits, complications, treatment options, and expected outcomes were discussed with the patient. The patient concurred with the proposed plan, giving informed consent. identified as Alie L Evola and the procedure verified as C-Section Delivery. A Time Out was held and the above information confirmed.  After induction of anesthesia, the patient was draped and prepped in the usual sterile manner. A transverse incision was made and carried down through the subcutaneous tissue to the fascia. Fascial incision was made and extended transversely. The fascia was separated from the underlying rectus tissue superiorly and inferiorly. The peritoneum was identified and entered. Peritoneal incision was extended longitudinally. The utero-vesical peritoneal reflection was incised transversely and the bladder flap was bluntly freed from the lower uterine segment. A low transverse uterine incision was made. Delivered from cephalic presentation was a  pound Living newborn infant(s) or Female with Apgar scores of 8 at one minute and 9 at five minutes. Cord ph was not sent the umbilical cord was clamped and cut cord blood was obtained for evaluation. The placenta was removed Intact and appeared normal. The uterine outline, tubes and ovaries appeared normal}. The uterine incision was closed with running locked sutures of 0Vicryl. A second layer of 0 vicryl was used to imbricate the uterus.  The patients left fallopian tube was grasped at the mid ishtmic portion with babcock clamp, ligated with 2-0 plain and  excised.  The patients right fallopian tube was followed out to the fimbriated end.  The mid isthmic portion of the tube was ligated with 2-0 plain and excised.  Both portions of tubes was sent to pathology.     Hemostasis was observed. Lavage was carried out until clear. The fascia was then reapproximated with running sutures of 0Vicryl. The subcuticular closure was performed using 2-0plain gut. The skin was closed with 3-75monocryl.   Instrument, sponge, and needle counts were correct prior the abdominal closure and were correct at the conclusion of the case.    Findings: female infantin vtx presentation with clear fluid. Moderate pelvid abdominal adhesions.  Small right ovarian remnant.  Normal appearing tubes bilaterally   Estimated Blood Loss: 800  Total IV Fluids: 1021ml   Urine Output: 250CC OF clear urine  Specimens: placenta   Complications: no complications  Disposition: PACU - hemodynamically stable.   Maternal Condition: stable   Baby condition / location:  Couplet care / Skin to Skin  Attending Attestation: I performed the procedure.   Signed: Surgeon(s): Betsy Coder, MD

## 2013-02-27 NOTE — MAU Note (Signed)
Pt was sent from office for repeat c-section in active labor.

## 2013-02-27 NOTE — Lactation Note (Signed)
This note was copied from the chart of Yolanda Jaspreet Ortego. Lactation Consultation Note Initial visit at 8 hours of age.  [facility] LC resources given and discussed.  Mom reports previous experience with older children.  Baby has not had a good feeding yet.  Baby awake and cuing.  Placed skin to skin in modified football hold.  Hand expression done with visible colostrum.  Mom has large nipples the indent in middle of nipple.  Baby is 36 weeks and sucks on tongue and lips.  Multiple attempts to get wide open mouth.  Baby latches with rhythmic suckling, but needs to work on depth.  Few swallows heard and large amounts of colostrum now expressible after baby suckling for several minutes.  Discussed positioning, good latch, feeding cues, skin to skin, and late preterm newborn feedings.  Mom to call for assist from [facility] as needed.   Mom is alone with baby recovering from a c-section.  More teaching to be done with future visits.    Patient Name: [name] [id number] Date: 02/27/2013 Reason for consult: Initial assessment;Difficult latch   Maternal Data Formula Feeding for Exclusion: No Has patient been taught Hand Expression?: Yes Does the patient have breastfeeding experience prior to this delivery?: Yes  Feeding Feeding Type: Breast Fed Length of feed:  (5 minutes of observed feeding)  LATCH Score/Interventions Latch: Repeated attempts needed to sustain latch, nipple held in mouth throughout feeding, stimulation needed to elicit sucking reflex. Intervention(s): Skin to skin;Teach feeding cues;Waking techniques Intervention(s): Assist with latch;Adjust position  Audible Swallowing: A few with stimulation Intervention(s): Skin to skin;Hand expression  Type of Nipple: Flat  Comfort (Breast/Nipple): Soft / non-tender     Hold (Positioning): Assistance needed to correctly position infant at breast and maintain latch. Intervention(s): Breastfeeding basics reviewed;Support  Pillows;Position options;Skin to skin  LATCH Score: 6  Lactation Tools Discussed/Used     Consult Status Consult Status: Follow-up Date: 02/28/13 Follow-up type: In-patient    Shoptaw, Justine Null 02/27/2013, 10:56 PM

## 2013-02-28 ENCOUNTER — Encounter (HOSPITAL_COMMUNITY): Payer: Self-pay | Admitting: Obstetrics and Gynecology

## 2013-02-28 LAB — CBC
HCT: 27.3 % — ABNORMAL LOW (ref 36.0–46.0)
HEMOGLOBIN: 9.2 g/dL — AB (ref 12.0–15.0)
MCH: 26.8 pg (ref 26.0–34.0)
MCHC: 33.7 g/dL (ref 30.0–36.0)
MCV: 79.6 fL (ref 78.0–100.0)
Platelets: 132 10*3/uL — ABNORMAL LOW (ref 150–400)
RBC: 3.43 MIL/uL — AB (ref 3.87–5.11)
RDW: 15.2 % (ref 11.5–15.5)
WBC: 12 10*3/uL — ABNORMAL HIGH (ref 4.0–10.5)

## 2013-02-28 NOTE — Progress Notes (Signed)
Subjective: Postpartum Day 1: Cesarean Delivery and tubal sterilization procedure Patient reports incisional pain and tolerating PO.    Objective: Vital signs in last 24 hours: Temp:  [97.4 F (36.3 C)-98.9 F (37.2 C)] 98.1 F (36.7 C) (01/08 1355) Pulse Rate:  [67-98] 72 (01/08 1355) Resp:  [16-31] 20 (01/08 1355) BP: (98-123)/(59-74) 107/63 mmHg (01/08 1355) SpO2:  [98 %-100 %] 98 % (01/08 1355)  Physical Exam:  General: alert and no distress Lochia: appropriate Uterine Fundus: firm Incision: Dressing is clean and dry DVT Evaluation: No evidence of DVT seen on physical exam.   Recent Labs  02/27/13 1238 02/28/13 0550  HGB 11.2* 9.2*  HCT 32.8* 27.3*    Assessment/Plan: Status post Cesarean section. Doing well postoperatively.  Continue current care. Anemia  Tirso Laws V 02/28/2013, 4:23 PM

## 2013-02-28 NOTE — Lactation Note (Signed)
This note was copied from the chart of [name]. Lactation Consultation Note  Patient Name: [name] Today's Date: 02/28/2013 Reason for consult: Follow-up assessment Mom having trouble latching baby. Mom's nipples large, areola compressible. Baby able to latch with repeated attempts, swallows heard, and colostrum noted. Positioned baby STS in football hold. Assisted with breast compression to assist with deep latch. Baby eagerly opened mouth wide, and after a few attempts, latched well with active nursing and audible swallows heard. Baby came off breasts several times, but re-latched easily and eagerly. Basic teaching given. Mom sleepy, will need assistance, has no support person in room. Mom post-pumped for 15 min., getting drops that were given to the baby. Plan is to breast feed baby again around 2:15 and post-pump and give baby any expressed baby milk. Mom given supplementation parameters chart. Teaching about early pre-term needs given. LC will follow-up later today.   Maternal Data    Feeding Feeding Type: Breast Fed Length of feed: 20 min  LATCH Score/Interventions Latch: Repeated attempts needed to sustain latch, nipple held in mouth throughout feeding, stimulation needed to elicit sucking reflex. Intervention(s): Skin to skin;Teach feeding cues;Waking techniques Intervention(s): Adjust position;Assist with latch;Breast massage;Breast compression  Audible Swallowing: A few with stimulation Intervention(s): Hand expression;Skin to skin  Type of Nipple: Everted at rest and after stimulation  Comfort (Breast/Nipple): Soft / non-tender     Hold (Positioning): Assistance needed to correctly position infant at breast and maintain latch. Intervention(s): Breastfeeding basics reviewed;Support Pillows;Position options;Skin to skin  LATCH Score: 7  Lactation Tools Discussed/Used Tools: Flanges;Pump Breast pump type: Double-Electric Breast Pump Pump Review:  Setup, frequency, and cleaning Initiated by:: LP/JW   Consult Status Consult Status: Follow-up Date: 02/28/13 Follow-up type: In-patient    Inocente Salles 02/28/2013, 12:18 PM

## 2013-02-28 NOTE — Anesthesia Postprocedure Evaluation (Signed)
  Anesthesia Post-op Note  Patient: [name]  Procedure(s) Performed: Procedure(s): CESAREAN SECTION with tubal ligation (N/A)  Patient Location: Mother/Baby  Anesthesia Type:Spinal  Level of Consciousness: awake, alert  and oriented  Airway and Oxygen Therapy: Patient Spontanous Breathing  Post-op Pain: none  Post-op Assessment: Post-op Vital signs reviewed, Patient's Cardiovascular Status Stable, Respiratory Function Stable, No headache, No backache, No residual numbness and No residual motor weakness  Post-op Vital Signs: Reviewed and stable  Complications: No apparent anesthesia complications

## 2013-03-01 MED ORDER — POLYSACCHARIDE IRON COMPLEX 150 MG PO CAPS
150.0000 mg | ORAL_CAPSULE | Freq: Two times a day (BID) | ORAL | Status: DC
  Administered 2013-03-01 – 2013-03-02 (×3): 150 mg via ORAL
  Filled 2013-03-01 (×4): qty 1

## 2013-03-01 NOTE — Lactation Note (Signed)
This note was copied from the chart of [name]. Lactation Consultation Note: Called to assist mom with latch.  Mom has large nipples but baby was able to get latched and mom reports tugging- no pain. Mom easily able to express whitish milk. Mom pleased she did better. No questions at present. To call for assist prn,  Patient Name: [name] KAJGO'T Date: 03/01/2013 Reason for consult: Follow-up assessment   Maternal Data    Feeding Feeding Type: Breast Fed Length of feed: 12 min  LATCH Score/Interventions Latch: Grasps breast easily, tongue down, lips flanged, rhythmical sucking.  Audible Swallowing: A few with stimulation  Type of Nipple: Everted at rest and after stimulation (lg nipples)  Comfort (Breast/Nipple): Soft / non-tender     Hold (Positioning): Assistance needed to correctly position infant at breast and maintain latch. Intervention(s): Breastfeeding basics reviewed;Support Pillows;Position options  LATCH Score: 8  Lactation Tools Discussed/Used     Consult Status Consult Status: Follow-up Date: 03/02/13 Follow-up type: In-patient    Truddie Crumble 03/01/2013, 11:42 AM

## 2013-03-01 NOTE — Progress Notes (Addendum)
Subjective: Postpartum Day 2. Repeat Cesarean Delivery due to labor, preterm. Patient up ad lib, reports no syncope or dizziness, headache, or racing heart. No bowel movement, but passing gas. Feeding:  Breast, Supplementing with formula  Contraceptive plan:  BTL  Objective: Vital signs in last 24 hours: Temp:  [97.1 F (36.2 C)-98.3 F (36.8 C)] 97.1 F (36.2 C) (01/09 0525) Pulse Rate:  [67-79] 77 (01/09 0525) Resp:  [18-20] 18 (01/09 0525) BP: (95-114)/(61-72) 114/72 mmHg (01/09 0525) SpO2:  [98 %-100 %] 98 % (01/08 1355)  Physical Exam:  General: alert, cooperative, fatigued and no distress Lochia: appropriate Uterine Fundus: firm Incision: healing well, no drainage noted on dressing DVT Evaluation: No evidence of DVT seen on physical exam. Negative Homan's sign. JP drain:   None   Recent Labs  02/27/13 1238 02/28/13 0550  HGB 11.2* 9.2*  HCT 32.8* 27.3*    Assessment/Plan: Status post Cesarean section day 2. Asymptomatic Anemia-Rx Fe start today  Doing well postoperatively.  Continue current care. Plan for discharge tomorrow and Breastfeeding    Linda Hedges 03/01/2013, 9:19 AM

## 2013-03-01 NOTE — Lactation Note (Signed)
This note was copied from the chart of Yolanda Nur Gehling. Lactation Consultation Note: Follow up visit with mom. She reports that baby latches but only nurses for 2-3 min. Then goes off to sleep. Baby last fed about 1 hour ago. She is asleep at mom's side. Reports that she has not been pumping because every time she pumps the baby awakes up. Encouraged to page for assist at next feeding.   Patient Name: [name] [id number] Date: 03/01/2013 Reason for consult: Follow-up assessment   Maternal Data    Feeding   LATCH Score/Interventions                      Lactation Tools Discussed/Used     Consult Status Consult Status: Follow-up Date: 03/01/13 Follow-up type: In-patient    Truddie Crumble 03/01/2013, 10:40 AM

## 2013-03-02 MED ORDER — BUTALBITAL-APAP-CAFFEINE 50-325-40 MG PO TABS: 2.0000 | ORAL_TABLET | Freq: Four times a day (QID) | ORAL | Status: DC | PRN

## 2013-03-02 MED ORDER — POLYSACCHARIDE IRON COMPLEX 150 MG PO CAPS: 150.0000 mg | ORAL_CAPSULE | Freq: Two times a day (BID) | ORAL | Status: DC

## 2013-03-02 MED ORDER — BUTALBITAL-APAP-CAFFEINE 50-325-40 MG PO TABS
2.0000 | ORAL_TABLET | Freq: Four times a day (QID) | ORAL | Status: DC | PRN
  Administered 2013-03-02: 2 via ORAL
  Filled 2013-03-02: qty 2

## 2013-03-02 MED ORDER — OXYCODONE-ACETAMINOPHEN 5-325 MG PO TABS: 1.0000 | ORAL_TABLET | ORAL | Status: DC | PRN

## 2013-03-02 MED ORDER — IBUPROFEN 600 MG PO TABS: 600.0000 mg | ORAL_TABLET | Freq: Four times a day (QID) | ORAL | Status: DC

## 2013-03-02 NOTE — Progress Notes (Signed)
Patient ID: [name], female   DOB: [birth year], 28 y.o.   MRN: [id number] Subjective: Postpartum Day 3: Cesarean Delivery secondary to: repeat with bilateral salpingectomy  Patient reports tolerating PO, + BM and no problems voiding.   C/o neck pain since this AM. Now feels getting a headache, not tender to palpation, has FROM, tho states it's painful to move, also c/o photosensitivity now  up ad lib without syncope Pain otherwise well controlled with po meds BF well  Mood stable, bonding well Contraception: BTL   Objective: Vital signs in last 24 hours: Temp:  [97.2 F (36.2 C)-97.8 F (36.6 C)] 97.2 F (36.2 C) (01/10 0541) Pulse Rate:  [72-79] 72 (01/10 0541) Resp:  [18] 18 (01/10 0541) BP: (111-112)/(72-74) 111/72 mmHg (01/10 0541)  Physical Exam:  General: alert and mild distress Heart: RRR Lungs: CTAB Abdomen: BS x4 Uterine Fundus: firm Incision: healing well, honeycomb dressing in place  Lochia: appropriate DVT Evaluation: No evidence of DVT seen on physical exam. Negative Homan's sign. No significant calf/ankle edema.   Recent Labs  02/28/13 0550  HGB 9.2*  HCT 27.3*    Assessment/Plan: Status post Cesarean section. Doing well postoperatively.  Will consult anesthesia to r/o any etiology r/t receiving spinal  Will then consult w Dr Charlesetta Garibaldi  Consider d/c home if pain improves    [name] M 03/02/2013, 4:58 PM

## 2013-03-02 NOTE — Discharge Instructions (Signed)
Cesarean Delivery Care After Refer to this sheet in the next few weeks. These instructions provide you with information on caring for yourself after your procedure. Your health care provider may also give you specific instructions. Your treatment has been planned according to current medical practices, but problems sometimes occur. Call your health care provider if you have any problems or questions after you go home. HOME CARE INSTRUCTIONS  Only take over-the-counter or prescription medications as directed by your health care provider.  Do not drink alcohol, especially if you are breastfeeding or taking medication to relieve pain.  Do not chew or smoke tobacco.  Continue to use good perineal care. Good perineal care includes:  Wiping your perineum from front to back.  Keeping your perineum clean.  Check your surgical cut (incision) daily for increased redness, drainage, swelling, or separation of skin.  Clean your incision gently with soap and water every day, and then pat it dry. If your health care provider says it is OK, leave the incision uncovered. Use a bandage (dressing) if the incision is draining fluid or appears irritated. If the adhesive strips across the incision do not fall off within 7 days, carefully peel them off.  Hug a pillow when coughing or sneezing until your incision is healed. This helps to relieve pain.  Do not use tampons or douche until your health care provider says it is okay.  Shower, wash your hair, and take tub baths as directed by your health care provider.  Wear a well-fitting bra that provides breast support.  Limit wearing support panties or control-top hose.  Drink enough fluids to keep your urine clear or pale yellow.  Eat high-fiber foods such as whole grain cereals and breads, brown rice, beans, and fresh fruits and vegetables every day. These foods may help prevent or relieve constipation.  Resume activities such as climbing stairs,  driving, lifting, exercising, or traveling as directed by your health care provider.  Talk to your health care provider about resuming sexual activities. This is dependent upon your risk of infection, your rate of healing, and your comfort and desire to resume sexual activity.  Try to have someone help you with your household activities and your newborn for at least a few days after you leave the hospital.  Rest as much as possible. Try to rest or take a nap when your newborn is sleeping.  Increase your activities gradually.  Keep all of your scheduled postpartum appointments. It is very important to keep your scheduled follow-up appointments. At these appointments, your health care provider will be checking to make sure that you are healing physically and emotionally. SEEK MEDICAL CARE IF:   You are passing large clots from your vagina. Save any clots to show your health care provider.  You have a foul smelling discharge from your vagina.  You have trouble urinating.  You are urinating frequently.  You have pain when you urinate.  You have a change in your bowel movements.  You have increasing redness, pain, or swelling near your incision.  You have pus draining from your incision.  Your incision is separating.  You have painful, hard, or reddened breasts.  You have a severe headache.  You have blurred vision or see spots.  You feel sad or depressed.  You have thoughts of hurting yourself or your newborn.  You have questions about your care, the care of your newborn, or medications.  You are dizzy or lightheaded.  You have a rash.  You  have pain, redness, or swelling at the site of the removed intravenous access (IV) tube.  You have nausea or vomiting.  You stopped breastfeeding and have not had a menstrual period within 12 weeks of stopping.  You are not breastfeeding and have not had a menstrual period within 12 weeks of delivery.  You have a fever. SEEK  IMMEDIATE MEDICAL CARE IF:  You have persistent pain.  You have chest pain.  You have shortness of breath.  You faint.  You have leg pain.  You have stomach pain.  Your vaginal bleeding saturates 2 or more sanitary pads in 1 hour. MAKE SURE YOU:   Understand these instructions.  Will watch your condition.  Will get help right away if you are not doing well or get worse. Document Released: 10/30/2001 Document Revised: 10/10/2012 Document Reviewed: 10/05/2011 [facility] Patient Information 2014 [place].    Iron-Rich Diet  An iron-rich diet contains foods that are good sources of iron. Iron is an important mineral that helps your body produce hemoglobin. Hemoglobin is a protein in red blood cells that carries oxygen to the body's tissues. Sometimes, the iron level in your blood can be low. This may be caused by:  A lack of iron in your diet.  Blood loss.  Times of growth, such as during pregnancy or during a child's growth and development. Low levels of iron can cause a decrease in the number of red blood cells. This can result in iron deficiency anemia. Iron deficiency anemia symptoms include:  Tiredness.  Weakness.  Irritability.  Increased chance of infection. Here are some recommendations for daily iron intake:  Males older than 29 years of age need 8 mg of iron per day.  Women ages 12 to 31 need 18 mg of iron per day.  Pregnant women need 27 mg of iron per day, and women who are over 71 years of age and breastfeeding need 9 mg of iron per day.  Women over the age of 73 need 8 mg of iron per day. SOURCES OF IRON There are 2 types of iron that are found in food: heme iron and nonheme iron. Heme iron is absorbed by the body better than nonheme iron. Heme iron is found in meat, poultry, and fish. Nonheme iron is found in grains, beans, and vegetables. Heme Iron Sources Food / Iron (mg)  Chicken liver, 3 oz (85 g)/ 10 mg  Beef liver, 3 oz (85 g)/ 5.5  mg  Oysters, 3 oz (85 g)/ 8 mg  Beef, 3 oz (85 g)/ 2 to 3 mg  Shrimp, 3 oz (85 g)/ 2.8 mg  [place], 3 oz (85 g)/ 2 mg  Chicken, 3 oz (85 g) / 1 mg  Fish (tuna, halibut), 3 oz (85 g)/ 1 mg  Pork, 3 oz (85 g)/ 0.9 mg Nonheme Iron Sources Food / Iron (mg)  Ready-to-eat breakfast cereal, iron-fortified / 3.9 to 7 mg  Tofu,  cup / 3.4 mg  Kidney beans,  cup / 2.6 mg  Baked potato with skin / 2.7 mg  Asparagus,  cup / 2.2 mg  Avocado / 2 mg  Dried peaches,  cup / 1.6 mg  Raisins,  cup / 1.5 mg  Soy milk, 1 cup / 1.5 mg  Whole-wheat bread, 1 slice / 1.2 mg  Spinach, 1 cup / 0.8 mg  Broccoli,  cup / 0.6 mg IRON ABSORPTION Certain foods can decrease the body's absorption of iron. Try to avoid these foods and beverages  while eating meals with iron-containing foods:  Coffee.  Tea.  Fiber.  Soy. Foods containing vitamin C can help increase the amount of iron your body absorbs from iron sources, especially from nonheme sources. Eat foods with vitamin C along with iron-containing foods to increase your iron absorption. Foods that are high in vitamin C include many fruits and vegetables. Some good sources are:  Fresh orange juice.  Oranges.  Strawberries.  Mangoes.  Grapefruit.  Red bell peppers.  Green bell peppers.  Broccoli.  Potatoes with skin.  Tomato juice. Document Released: 09/21/2004 Document Revised: 05/02/2011 Document Reviewed: 07/29/2010 [facility] Patient Information 2014 [place], [place].  Breastfeeding Deciding to breastfeed is one of the best choices you can make for you and your baby. A change in hormones during pregnancy causes your breast tissue to grow and increases the number and size of your milk ducts. These hormones also allow proteins, sugars, and fats from your blood supply to make breast milk in your milk-producing glands. Hormones prevent breast milk from being released before your baby is born as well as prompt milk flow  after birth. Once breastfeeding has begun, thoughts of your baby, as well as his or her sucking or crying, can stimulate the release of milk from your milk-producing glands.  BENEFITS OF BREASTFEEDING For Your Baby  Your first milk (colostrum) helps your baby's digestive system function better.   There are antibodies in your milk that help your baby fight off infections.   Your baby has a lower incidence of asthma, allergies, and sudden infant death syndrome.   The nutrients in breast milk are better for your baby than infant formulas and are designed uniquely for your baby's needs.   Breast milk improves your baby's brain development.   Your baby is less likely to develop other conditions, such as childhood obesity, asthma, or type 2 diabetes mellitus.  For You   Breastfeeding helps to create a very special bond between you and your baby.   Breastfeeding is convenient. Breast milk is always available at the correct temperature and costs nothing.   Breastfeeding helps to burn calories and helps you lose the weight gained during pregnancy.   Breastfeeding makes your uterus contract to its prepregnancy size faster and slows bleeding (lochia) after you give birth.   Breastfeeding helps to lower your risk of developing type 2 diabetes mellitus, osteoporosis, and breast or ovarian cancer later in life. SIGNS THAT YOUR BABY IS HUNGRY Early Signs of Hunger  Increased alertness or activity.  Stretching.  Movement of the head from side to side.  Movement of the head and opening of the mouth when the corner of the mouth or cheek is stroked (rooting).  Increased sucking sounds, smacking lips, cooing, sighing, or squeaking.  Hand-to-mouth movements.  Increased sucking of fingers or hands. Late Signs of Hunger  Fussing.  Intermittent crying. Extreme Signs of Hunger Signs of extreme hunger will require calming and consoling before your baby will be able to breastfeed  successfully. Do not wait for the following signs of extreme hunger to occur before you initiate breastfeeding:   Restlessness.  A loud, strong cry.   Screaming. BREASTFEEDING BASICS Breastfeeding Initiation  Find a comfortable place to sit or lie down, with your neck and back well supported.  Place a pillow or rolled up blanket under your baby to bring him or her to the level of your breast (if you are seated). Nursing pillows are specially designed to help support your arms and your  baby while you breastfeed.  Make sure that your baby's abdomen is facing your abdomen.   Gently massage your breast. With your fingertips, massage from your chest wall toward your nipple in a circular motion. This encourages milk flow. You may need to continue this action during the feeding if your milk flows slowly.  Support your breast with 4 fingers underneath and your thumb above your nipple. Make sure your fingers are well away from your nipple and your baby's mouth.   Stroke your baby's lips gently with your finger or nipple.   When your baby's mouth is open wide enough, quickly bring your baby to your breast, placing your entire nipple and as much of the colored area around your nipple (areola) as possible into your baby's mouth.   More areola should be visible above your baby's upper lip than below the lower lip.   Your baby's tongue should be between his or her lower gum and your breast.   Ensure that your baby's mouth is correctly positioned around your nipple (latched). Your baby's lips should create a seal on your breast and be turned out (everted).  It is common for your baby to suck about 2 3 minutes in order to start the flow of breast milk. Latching Teaching your baby how to latch on to your breast properly is very important. An improper latch can cause nipple pain and decreased milk supply for you and poor weight gain in your baby. Also, if your baby is not latched onto your  nipple properly, he or she may swallow some air during feeding. This can make your baby fussy. Burping your baby when you switch breasts during the feeding can help to get rid of the air. However, teaching your baby to latch on properly is still the best way to prevent fussiness from swallowing air while breastfeeding. Signs that your baby has successfully latched on to your nipple:    Silent tugging or silent sucking, without causing you pain.   Swallowing heard between every 3 4 sucks.    Muscle movement above and in front of his or her ears while sucking.  Signs that your baby has not successfully latched on to nipple:   Sucking sounds or smacking sounds from your baby while breastfeeding.  Nipple pain. If you think your baby has not latched on correctly, slip your finger into the corner of your baby's mouth to break the suction and place it between your baby's gums. Attempt breastfeeding initiation again. Signs of Successful Breastfeeding Signs from your baby:   A gradual decrease in the number of sucks or complete cessation of sucking.   Falling asleep.   Relaxation of his or her body.   Retention of a small amount of milk in his or her mouth.   Letting go of your breast by himself or herself. Signs from you:  Breasts that have increased in firmness, weight, and size 1 3 hours after feeding.   Breasts that are softer immediately after breastfeeding.  Increased milk volume, as well as a change in milk consistency and color by the 5th day of breastfeeding.   Nipples that are not sore, cracked, or bleeding. Signs That Your Pecola Leisure is Getting Enough Milk  Wetting at least 3 diapers in a 24-hour period. The urine should be clear and pale yellow by age 461 days.  At least 3 stools in a 24-hour period by age 461 days. The stool should be soft and yellow.  At least 3 stools  in a 24-hour period by age 77 days. The stool should be seedy and yellow.  No loss of weight greater  than 10% of birth weight during the first 18 days of age.  Average weight gain of 4 7 ounces (120 210 mL) per week after age 53 days.  Consistent daily weight gain by age 58 days, without weight loss after the age of 2 weeks. After a feeding, your baby may spit up a small amount. This is common. BREASTFEEDING FREQUENCY AND DURATION Frequent feeding will help you make more milk and can prevent sore nipples and breast engorgement. Breastfeed when you feel the need to reduce the fullness of your breasts or when your baby shows signs of hunger. This is called "breastfeeding on demand." Avoid introducing a pacifier to your baby while you are working to establish breastfeeding (the first 4 6 weeks after your baby is born). After this time you may choose to use a pacifier. Research has shown that pacifier use during the first year of a baby's life decreases the risk of sudden infant death syndrome (SIDS). Allow your baby to feed on each breast as long as he or she wants. Breastfeed until your baby is finished feeding. When your baby unlatches or falls asleep while feeding from the first breast, offer the second breast. Because newborns are often sleepy in the first few weeks of life, you may need to awaken your baby to get him or her to feed. Breastfeeding times will vary from baby to baby. However, the following rules can serve as a guide to help you ensure that your baby is properly fed:  Newborns (babies 40 weeks of age or younger) may breastfeed every 1 3 hours.  Newborns should not go longer than 3 hours during the day or 5 hours during the night without breastfeeding.  You should breastfeed your baby a minimum of 8 times in a 24-hour period until you begin to introduce solid foods to your baby at around 30 months of age. BREAST MILK PUMPING Pumping and storing breast milk allows you to ensure that your baby is exclusively fed your breast milk, even at times when you are unable to breastfeed. This is  especially important if you are going back to work while you are still breastfeeding or when you are not able to be present during feedings. Your lactation consultant can give you guidelines on how long it is safe to store breast milk.  A breast pump is a machine that allows you to pump milk from your breast into a sterile bottle. The pumped breast milk can then be stored in a refrigerator or freezer. Some breast pumps are operated by hand, while others use electricity. Ask your lactation consultant which type will work best for you. Breast pumps can be purchased, but some hospitals and breastfeeding support groups lease breast pumps on a monthly basis. A lactation consultant can teach you how to hand express breast milk, if you prefer not to use a pump.  CARING FOR YOUR BREASTS WHILE YOU BREASTFEED Nipples can become dry, cracked, and sore while breastfeeding. The following recommendations can help keep your breasts moisturized and healthy:  Avoid using soap on your nipples.   Wear a supportive bra. Although not required, special nursing bras and tank tops are designed to allow access to your breasts for breastfeeding without taking off your entire bra or top. Avoid wearing underwire style bras or extremely tight bras.  Air dry your nipples for 3 34minutes after  each feeding.   Use only cotton bra pads to absorb leaked breast milk. Leaking of breast milk between feedings is normal.   Use lanolin on your nipples after breastfeeding. Lanolin helps to maintain your skin's normal moisture barrier. If you use pure lanolin you do not need to wash it off before feeding your baby again. Pure lanolin is not toxic to your baby. You may also hand express a few drops of breast milk and gently massage that milk into your nipples and allow the milk to air dry. In the first few weeks after giving birth, some women experience extremely full breasts (engorgement). Engorgement can make your breasts feel heavy, warm,  and tender to the touch. Engorgement peaks within 3 5 days after you give birth. The following recommendations can help ease engorgement:  Completely empty your breasts while breastfeeding or pumping. You may want to start by applying warm, moist heat (in the shower or with warm water-soaked hand towels) just before feeding or pumping. This increases circulation and helps the milk flow. If your baby does not completely empty your breasts while breastfeeding, pump any extra milk after he or she is finished.  Wear a snug bra (nursing or regular) or tank top for 1 2 days to signal your body to slightly decrease milk production.  Apply ice packs to your breasts, unless this is too uncomfortable for you.  Make sure that your baby is latched on and positioned properly while breastfeeding. If engorgement persists after 48 hours of following these recommendations, contact your health care provider or a Science writer. OVERALL HEALTH CARE RECOMMENDATIONS WHILE BREASTFEEDING  Eat healthy foods. Alternate between meals and snacks, eating 3 of each per day. Because what you eat affects your breast milk, some of the foods may make your baby more irritable than usual. Avoid eating these foods if you are sure that they are negatively affecting your baby.  Drink milk, fruit juice, and water to satisfy your thirst (about 10 glasses a day).   Rest often, relax, and continue to take your prenatal vitamins to prevent fatigue, stress, and anemia.  Continue breast self-awareness checks.  Avoid chewing and smoking tobacco.  Avoid alcohol and drug use. Some medicines that may be harmful to your baby can pass through breast milk. It is important to ask your health care provider before taking any medicine, including all over-the-counter and prescription medicine as well as vitamin and herbal supplements. It is possible to become pregnant while breastfeeding. If birth control is desired, ask your health care  provider about options that will be safe for your baby. SEEK MEDICAL CARE IF:   You feel like you want to stop breastfeeding or have become frustrated with breastfeeding.  You have painful breasts or nipples.  Your nipples are cracked or bleeding.  Your breasts are red, tender, or warm.  You have a swollen area on either breast.  You have a fever or chills.  You have nausea or vomiting.  You have drainage other than breast milk from your nipples.  Your breasts do not become full before feedings by the 5th day after you give birth.  You feel sad and depressed.  Your baby is too sleepy to eat well.  Your baby is having trouble sleeping.   Your baby is wetting less than 3 diapers in a 24-hour period.  Your baby has less than 3 stools in a 24-hour period.  Your baby's skin or the white part of his or her eyes becomes  yellow.   Your baby is not gaining weight by 79 days of age. SEEK IMMEDIATE MEDICAL CARE IF:   Your baby is overly tired (lethargic) and does not want to wake up and feed.  Your baby develops an unexplained fever. Document Released: 02/07/2005 Document Revised: 10/10/2012 Document Reviewed: 08/01/2012 [facility] Patient Information 2014 [place].  Postpartum Depression and Baby Blues The postpartum period begins right after the birth of a baby. During this time, there is often a great amount of joy and excitement. It is also a time of considerable changes in the life of the parent(s). Regardless of how many times a mother gives birth, each child brings new challenges and dynamics to the family. It is not unusual to have feelings of excitement accompanied by confusing shifts in moods, emotions, and thoughts. All mothers are at risk of developing postpartum depression or the "baby blues." These mood changes can occur right after giving birth, or they may occur many months after giving birth. The baby blues or postpartum depression can be mild or severe.  Additionally, postpartum depression can resolve rather quickly, or it can be a long-term condition. CAUSES Elevated hormones and their rapid decline are thought to be a main cause of postpartum depression and the baby blues. There are a number of hormones that radically change during and after pregnancy. Estrogen and progesterone usually decrease immediately after delivering your baby. The level of thyroid hormone and various cortisol steroids also rapidly drop. Other factors that play a major role in these changes include major life events and genetics.  RISK FACTORS If you have any of the following risks for the baby blues or postpartum depression, know what symptoms to watch out for during the postpartum period. Risk factors that may increase the likelihood of getting the baby blues or postpartum depression include:  Havinga personal or family history of depression.  Having depression while being pregnant.  Having premenstrual or oral contraceptive-associated mood issues.  Having exceptional life stress.  Having marital conflict.  Lacking a social support network.  Having a baby with special needs.  Having health problems such as diabetes. SYMPTOMS Baby blues symptoms include:  Brief fluctuations in mood, such as going from extreme happiness to sadness.  Decreased concentration.  Difficulty sleeping.  Crying spells, tearfulness.  Irritability.  Anxiety. Postpartum depression symptoms typically begin within the first month after giving birth. These symptoms include:  Difficulty sleeping or excessive sleepiness.  Marked weight loss.  Agitation.  Feelings of worthlessness.  Lack of interest in activity or food. Postpartum psychosis is a very concerning condition and can be dangerous. Fortunately, it is rare. Displaying any of the following symptoms is cause for immediate medical attention. Postpartum psychosis symptoms include:  Hallucinations and  delusions.  Bizarre or disorganized behavior.  Confusion or disorientation. DIAGNOSIS  A diagnosis is made by an evaluation of your symptoms. There are no medical or lab tests that lead to a diagnosis, but there are various questionnaires that a caregiver may use to identify those with the baby blues, postpartum depression, or psychosis. Often times, a screening tool called the [place] Postnatal Depression Scale is used to diagnose depression in the postpartum period.  TREATMENT The baby blues usually goes away on its own in 1 to 2 weeks. Social support is often all that is needed. You should be encouraged to get adequate sleep and rest. Occasionally, you may be given medicines to help you sleep.  Postpartum depression requires treatment as it can last several months  or longer if it is not treated. Treatment may include individual or group therapy, medicine, or both to address any social, physiological, and psychological factors that may play a role in the depression. Regular exercise, a healthy diet, rest, and social support may also be strongly recommended.  Postpartum psychosis is more serious and needs treatment right away. Hospitalization is often needed. HOME CARE INSTRUCTIONS  Get as much rest as you can. Nap when the baby sleeps.  Exercise regularly. Some women find yoga and walking to be beneficial.  Eat a balanced and nourishing diet.  Do little things that you enjoy. Have a cup of tea, take a bubble bath, read your favorite magazine, or listen to your favorite music.  Avoid alcohol.  Ask for help with household chores, cooking, grocery shopping, or running errands as needed. Do not try to do everything.  Talk to people close to you about how you are feeling. Get support from your partner, family members, friends, or other new moms.  Try to stay positive in how you think. Think about the things you are grateful for.  Do not spend a lot of time alone.  Only take medicine as  directed by your caregiver.  Keep all your postpartum appointments.  Let your caregiver know if you have any concerns. SEEK MEDICAL CARE IF: You are having a reaction or problems with your medicine. SEEK IMMEDIATE MEDICAL CARE IF:  You have suicidal feelings.  You feel you may harm the baby or someone else. Document Released: 11/12/2003 Document Revised: 05/02/2011 Document Reviewed: 11/19/2012 [facility] Patient Information 2014 [place], [place].

## 2013-03-02 NOTE — Progress Notes (Signed)
Evaluated [name] today for headache.  She is sitting in bed eating.  She describes what feels like swelling in her neck.  Non-tender to palpation.  Vitals all stable.  She has a history of migraines.  She describes some mild postural component to the headache, but complains that lying flat bothers her neck.  I think that this headache is unlikely to be PDPH but it may be a mild variant.  I discussed treatment options and she would prefer to avoid blood patch.  I will write for Fioricet and encourage PO hydration.  If the Fioricet works, she can continue to take it and take a prescription but she was instructed that she not need take it if it does not help.  Her questions were answered.

## 2013-03-02 NOTE — Progress Notes (Signed)
RN in room and discussed discharge instructions with pt. and FOB . Verbalization of understanding obtained from pt.  And FOB  Of discharge instructions.. ID bracelets matched  # E [id number] .  Pt given instructions to call Monday 03-04-2013 for appointment in 5 weeks.

## 2013-03-02 NOTE — Progress Notes (Signed)
Nurse Midwife called for CRNA  To check on Patient who complains of HA and Neck Pain. Post Op day 3 from C-section with a Spinal anesthetic. Patient BP is 111/72 eating without N/V. Legs without numbness or weakness.  Patient complain of Neck pain with HA and Photosensitivity. All VSS. Dr. Glennon Mac will assess patient for Migraine HA

## 2013-03-02 NOTE — Progress Notes (Addendum)
Subjective: Postpartum Day 3: Cesarean Delivery Patient reports tolerating PO and + flatus.   Pt c/o neck pain that radiates to her head.  She has some photophobia and h/o migraines  Objective: Vital signs in last 24 hours: Temp:  [97.2 F (36.2 C)-97.8 F (36.6 C)] 97.2 F (36.2 C) (01/10 0541) Pulse Rate:  [72-79] 72 (01/10 0541) Resp:  [18] 18 (01/10 0541) BP: (111-112)/(72-74) 111/72 mmHg (01/10 0541)  Physical Exam:  General: alert and cooperative Lochia: appropriate Uterine Fundus: firm Incision: healing well DVT Evaluation: No evidence of DVT seen on physical exam. Physical Examination: Mental status - alert, oriented to person, place, and time Neck - supple, no significant adenopathy, no nuchal rigidity   Recent Labs  02/28/13 0550  HGB 9.2*  HCT 27.3*    Assessment/Plan: Status post Cesarean section. Postoperative course complicated by neck pain and migraine. will try Fioricet and flexeril if needed.  Doubt preeclampsia bc of normal blood pressures . Appreciate Dr Glennon Mac seeing the pt DC home after hour or two evaluation.  [place] A 03/02/2013, 5:04 PM

## 2013-03-02 NOTE — Discharge Summary (Addendum)
Cesarean Section Delivery Discharge Summary  [name]  DOB:    01-13-1986 MRN:    [id number] CSN:    [id number]  Date of admission:                  02/27/13    Date of discharge:                   03/02/13  Procedures this admission: cesarean section w BTL   Date of Delivery: 02/27/13  Newborn Data:  Live born female  Birth Weight: 6 lb 4 oz (2835 g) APGAR: 8, 9  Home with mother.   History of Present Illness:  Ms. CLARESA [name] is a 28 y.o. female, [phone], who presents at [redacted]w[redacted]d weeks gestation. The patient has been followed at the [facility] and Gynecology division of [facility].    Her pregnancy has been complicated by:  Patient Active Problem List   Diagnosis Date Noted  . Cesarean delivery delivered 02/27/2013  . Vaginal bleeding in pregnancy 08/14/2012  . Hx of ovarian cyst 07/21/2012  . Previous cesarean delivery, antepartum condition or complication x 2 0000000  . Smoker 07/21/2012   none.  Hospital course:  The patient was admitted for c-section .   Her postpartum course was not complicated. She did have some onset of neck pain and stiffness on day 2, which did resolve with flexeril, anesthesia staff did see and evaluate her, etiology was unknown, but was not felt to be an acute process.  She was discharged to home on postpartum day 3 doing well.  Feeding:  breast  Contraception:  bilateral tubal ligation  Discharge hemoglobin:  Hemoglobin  Date Value Range Status  02/28/2013 9.2* 12.0 - 15.0 g/dL Final     DELTA CHECK NOTED     REPEATED TO VERIFY     HCT  Date Value Range Status  02/28/2013 27.3* 36.0 - 46.0 % Final    Discharge Physical Exam:   General: alert and no distress Lochia: appropriate Uterine Fundus: firm Incision: healing well DVT Evaluation: No evidence of DVT seen on physical exam. Negative Homan's sign. No significant calf/ankle edema.  Intrapartum Procedures: cesarean: low  cervical, transverse and BTL  Postpartum Procedures: none Complications-Operative and Postpartum: none  Discharge Diagnoses: Term Pregnancy-delivered  Discharge Information:  Activity:           pelvic rest Diet:                routine and iron rich Medications: None, Ibuprofen and Percocet Condition:      stable Instructions:  Care After Cesarean Delivery  Refer to this sheet in the next few weeks. These instructions provide you with information on caring for yourself after your procedure. Your caregiver may also give you specific instructions. Your treatment has been planned according to current medical practices, but problems sometimes occur. Call your caregiver if you have any problems or questions after you go home. HOME CARE INSTRUCTIONS  Only take over-the-counter or prescription medicines as directed by your caregiver.  Do not drink alcohol, especially if you are breastfeeding or taking medicine to relieve pain.  Do not chew or smoke tobacco.  Continue to use good perineal care. Good perineal care includes:  Wiping your perineum from front to back.  Keeping your perineum clean.  Check your cut (incision) daily for increased redness, drainage, swelling, or separation of skin.  Clean your incision gently with soap and water every day, and  then pat it dry. If your caregiver says it is okay, leave the incision uncovered. Use a bandage (dressing) if the incision is draining fluid or appears irritated. If the adhesive strips across the incision do not fall off within 7 days, carefully peel them off.  Hug a pillow when coughing or sneezing until your incision is healed. This helps to relieve pain.  Do not use tampons or douche until your caregiver says it is okay.  Shower, wash your hair, and take tub baths as directed by your caregiver.  Wear a well-fitting bra that provides breast support.  Limit wearing support panties or control-top hose.  Drink enough fluids to  keep your urine clear or pale yellow.  Eat high-fiber foods such as whole grain cereals and breads, brown rice, beans, and fresh fruits and vegetables every day. These foods may help prevent or relieve constipation.  Resume activities such as climbing stairs, driving, lifting, exercising, or traveling as directed by your caregiver.  Talk to your caregiver about resuming sexual activities. This is dependent upon your risk of infection, your rate of healing, and your comfort and desire to resume sexual activity.  Try to have someone help you with your household activities and your newborn for at least a few days after you leave the hospital.  Rest as much as possible. Try to rest or take a nap when your newborn is sleeping.  Increase your activities gradually.  Keep all of your scheduled postpartum appointments. It is very important to keep your scheduled follow-up appointments. At these appointments, your caregiver will be checking to make sure that you are healing physically and emotionally. SEEK MEDICAL CARE IF:   You are passing large clots from your vagina. Save any clots to show your caregiver.  You have a foul smelling discharge from your vagina.  You have trouble urinating.  You are urinating frequently.  You have pain when you urinate.  You have a change in your bowel movements.  You have increasing redness, pain, or swelling near your incision.  You have pus draining from your incision.  Your incision is separating.  You have painful, hard, or reddened breasts.  You have a severe headache.  You have blurred vision or see spots.  You feel sad or depressed.  You have thoughts of hurting yourself or your newborn.  You have questions about your care, the care of your newborn, or medicines.  You are dizzy or lightheaded.  You have a rash.  You have pain, redness, or swelling at the site of the removed intravenous access (IV) tube.  You have nausea or  vomiting.  You stopped breastfeeding and have not had a menstrual period within 12 weeks of stopping.  You are not breastfeeding and have not had a menstrual period within 12 weeks of delivery.  You have a fever. SEEK IMMEDIATE MEDICAL CARE IF:  You have persistent pain.  You have chest pain.  You have shortness of breath.  You faint.  You have leg pain.  You have stomach pain.  Your vaginal bleeding saturates 2 or more sanitary pads in 1 hour. MAKE SURE YOU:   Understand these instructions.  Will watch your condition.  Will get help right away if you are not doing well or get worse. Document Released: 10/30/2001 Document Revised: 11/02/2011 Document Reviewed: 10/05/2011 [facility] Patient Information 2014 [place].   Postpartum Depression and Baby Blues  The postpartum period begins right after the birth of a baby. During this time,  there is often a great amount of joy and excitement. It is also a time of considerable changes in the life of the parent(s). Regardless of how many times a mother gives birth, each child brings new challenges and dynamics to the family. It is not unusual to have feelings of excitement accompanied by confusing shifts in moods, emotions, and thoughts. All mothers are at risk of developing postpartum depression or the "baby blues." These mood changes can occur right after giving birth, or they may occur many months after giving birth. The baby blues or postpartum depression can be mild or severe. Additionally, postpartum depression can resolve rather quickly, or it can be a long-term condition. CAUSES Elevated hormones and their rapid decline are thought to be a main cause of postpartum depression and the baby blues. There are a number of hormones that radically change during and after pregnancy. Estrogen and progesterone usually decrease immediately after delivering your baby. The level of thyroid hormone and various cortisol steroids also rapidly  drop. Other factors that play a major role in these changes include major life events and genetics.  RISK FACTORS If you have any of the following risks for the baby blues or postpartum depression, know what symptoms to watch out for during the postpartum period. Risk factors that may increase the likelihood of getting the baby blues or postpartum depression include:  Havinga personal or family history of depression.  Having depression while being pregnant.  Having premenstrual or oral contraceptive-associated mood issues.  Having exceptional life stress.  Having marital conflict.  Lacking a social support network.  Having a baby with special needs.  Having health problems such as diabetes. SYMPTOMS Baby blues symptoms include:  Brief fluctuations in mood, such as going from extreme happiness to sadness.  Decreased concentration.  Difficulty sleeping.  Crying spells, tearfulness.  Irritability.  Anxiety. Postpartum depression symptoms typically begin within the first month after giving birth. These symptoms include:  Difficulty sleeping or excessive sleepiness.  Marked weight loss.  Agitation.  Feelings of worthlessness.  Lack of interest in activity or food. Postpartum psychosis is a very concerning condition and can be dangerous. Fortunately, it is rare. Displaying any of the following symptoms is cause for immediate medical attention. Postpartum psychosis symptoms include:  Hallucinations and delusions.  Bizarre or disorganized behavior.  Confusion or disorientation. DIAGNOSIS  A diagnosis is made by an evaluation of your symptoms. There are no medical or lab tests that lead to a diagnosis, but there are various questionnaires that a caregiver may use to identify those with the baby blues, postpartum depression, or psychosis. Often times, a screening tool called the [place] Postnatal Depression Scale is used to diagnose depression in the postpartum period.   TREATMENT The baby blues usually goes away on its own in 1 to 2 weeks. Social support is often all that is needed. You should be encouraged to get adequate sleep and rest. Occasionally, you may be given medicines to help you sleep.  Postpartum depression requires treatment as it can last several months or longer if it is not treated. Treatment may include individual or group therapy, medicine, or both to address any social, physiological, and psychological factors that may play a role in the depression. Regular exercise, a healthy diet, rest, and social support may also be strongly recommended.  Postpartum psychosis is more serious and needs treatment right away. Hospitalization is often needed. HOME CARE INSTRUCTIONS  Get as much rest as you can. Nap when the baby sleeps.  Exercise regularly. Some women find yoga and walking to be beneficial.  Eat a balanced and nourishing diet.  Do little things that you enjoy. Have a cup of tea, take a bubble bath, read your favorite magazine, or listen to your favorite music.  Avoid alcohol.  Ask for help with household chores, cooking, grocery shopping, or running errands as needed. Do not try to do everything.  Talk to people close to you about how you are feeling. Get support from your partner, family members, friends, or other new moms.  Try to stay positive in how you think. Think about the things you are grateful for.  Do not spend a lot of time alone.  Only take medicine as directed by your caregiver.  Keep all your postpartum appointments.  Let your caregiver know if you have any concerns. SEEK MEDICAL CARE IF: You are having a reaction or problems with your medicine. SEEK IMMEDIATE MEDICAL CARE IF:  You have suicidal feelings.  You feel you may harm the baby or someone else. Document Released: 11/12/2003 Document Revised: 05/02/2011 Document Reviewed: 12/14/2010 [facility] Patient Information 2014 [place], [place].  Discharge to:  home  Follow-up Information   Follow up with [facility] In 5 weeks.   Specialty:  Obstetrics and Gynecology   Contact information:   [address] [place] [address] [phone]       Charlane Ferretti 03/02/2013

## 2013-03-02 NOTE — Progress Notes (Signed)
Approximately one hour after administration of Fioricet patient reports head/neck pain of 5, down from 10 prior to administration of med. Reports can move head now without pain. Call made to CNM for status of discharge order. Deniece Ree, RN

## 2013-03-02 NOTE — Lactation Note (Signed)
This note was copied from the chart of Yolanda Lacrystal Andrus. Lactation Consultation Note  Patient Name: [name] Today's Date: 03/02/2013 Reason for consult: Follow-up assessment Per mom last fed at 0800 38 ml from a bottle of EBM, per mom tolerated well. Baby awake at consult - diaper changed wet, and stool . Baby rooting and due to eat, Assisted mom with positioning and depth at the breast. Mom has large nipple, baby opened  Her mouth and able to obtain depth , mom's milk is in and baby with stimulation able to sustain  A swallowing pattern. Increased with breast compressions. Reviewed sore nipple and engorgement  Prevention and tx, referring to the Baby and me booklet pg 24. Mom has a Engineer, materials with instruction. And a F/U apt Thursday January 14 at 1 pm at Lactation apt.   Maternal Data Has patient been taught Hand Expression?: Yes  Feeding Feeding Type: Breast Fed (right , football ) Length of feed:  (baby has been latched for 9 mins with multiply swallows )  LATCH Score/Interventions Latch: Grasps breast easily, tongue down, lips flanged, rhythmical sucking. Intervention(s): Skin to skin;Teach feeding cues;Waking techniques Intervention(s): Adjust position;Assist with latch;Breast massage;Breast compression  Audible Swallowing: Spontaneous and intermittent  Type of Nipple: Everted at rest and after stimulation  Comfort (Breast/Nipple): Soft / non-tender     Hold (Positioning): Assistance needed to correctly position infant at breast and maintain latch. (worked on the depth ) Intervention(s): Breastfeeding basics reviewed;Support Pillows;Position options;Skin to skin  LATCH Score: 9  Lactation Tools Discussed/Used Tools: Pump;Flanges Flange Size: 30 (per mom increased a few days ago , and comfortable ) Breast pump type: Double-Electric Breast Pump WIC Program: Yes (encouraged to call [facility] today and leave a measage for a [facility] loaner ) Pump Review: Setup,  frequency, and cleaning;Milk Storage Initiated by:: already set for the las t several    Consult Status Consult Status: Follow-up Date: 03/07/13 (1 pm ) Follow-up type: Out-patient    Myer Haff 03/02/2013, 11:58 AM

## 2013-03-07 ENCOUNTER — Ambulatory Visit (HOSPITAL_COMMUNITY): Payer: Medicaid Other

## 2013-03-20 ENCOUNTER — Inpatient Hospital Stay: Admit: 2013-03-20 | Payer: Self-pay | Admitting: Obstetrics and Gynecology

## 2013-03-20 SURGERY — Surgical Case
Anesthesia: Regional | Laterality: Left

## 2013-03-24 ENCOUNTER — Encounter (HOSPITAL_COMMUNITY): Payer: Self-pay | Admitting: Emergency Medicine

## 2013-03-24 ENCOUNTER — Emergency Department (INDEPENDENT_AMBULATORY_CARE_PROVIDER_SITE_OTHER)
Admission: EM | Admit: 2013-03-24 | Discharge: 2013-03-24 | Disposition: A | Discharge status: Home / Self Care | Payer: Medicaid Other | Source: Home / Self Care | Attending: Family Medicine | Admitting: Family Medicine

## 2013-03-24 DIAGNOSIS — N390 Urinary tract infection, site not specified: Secondary | ICD-10-CM

## 2013-03-24 LAB — POCT URINALYSIS DIP (DEVICE)
Bilirubin Urine: NEGATIVE
Glucose, UA: 100 mg/dL — AB
Ketones, ur: NEGATIVE mg/dL
NITRITE: POSITIVE — AB
Protein, ur: 30 mg/dL — AB
Specific Gravity, Urine: 1.01 (ref 1.005–1.030)
UROBILINOGEN UA: 1 mg/dL (ref 0.0–1.0)
pH: 5.5 (ref 5.0–8.0)

## 2013-03-24 LAB — POCT PREGNANCY, URINE: PREG TEST UR: NEGATIVE

## 2013-03-24 MED ORDER — CEPHALEXIN 500 MG PO CAPS: 500.0000 mg | ORAL_CAPSULE | Freq: Two times a day (BID) | ORAL | Status: DC

## 2013-03-24 NOTE — ED Provider Notes (Signed)
Medical screening examination/treatment/procedure(s) were performed by resident physician or non-physician practitioner and as supervising physician I was immediately available for consultation/collaboration.   Pauline Good MD.   Billy Fischer, MD 03/24/13 1230

## 2013-03-24 NOTE — ED Notes (Signed)
Pt c/o sx including pressure with urination, urinary frequency and pain x 1 day. Pt has been taking AZO tabs with no relief. Patient is currently breastfeeding and  unsure of what to take. Patient is alert and oriented and in no acute distress.

## 2013-03-24 NOTE — Discharge Instructions (Signed)
Urinary Tract Infection Urinary tract infections (UTIs) can develop anywhere along your urinary tract. Your urinary tract is your body's drainage system for removing wastes and extra water. Your urinary tract includes two kidneys, two ureters, a bladder, and a urethra. Your kidneys are a pair of bean-shaped organs. Each kidney is about the size of your fist. They are located below your ribs, one on each side of your spine. CAUSES Infections are caused by microbes, which are microscopic organisms, including fungi, viruses, and bacteria. These organisms are so small that they can only be seen through a microscope. Bacteria are the microbes that most commonly cause UTIs. SYMPTOMS  Symptoms of UTIs may vary by age and gender of the patient and by the location of the infection. Symptoms in [place] women typically include a frequent and intense urge to urinate and a painful, burning feeling in the bladder or urethra during urination. Older women and men are more likely to be tired, shaky, and weak and have muscle aches and abdominal pain. A fever may mean the infection is in your kidneys. Other symptoms of a kidney infection include pain in your back or sides below the ribs, nausea, and vomiting. DIAGNOSIS To diagnose a UTI, your caregiver will ask you about your symptoms. Your caregiver also will ask to provide a urine sample. The urine sample will be tested for bacteria and white blood cells. White blood cells are made by your body to help fight infection. TREATMENT  Typically, UTIs can be treated with medication. Because most UTIs are caused by a bacterial infection, they usually can be treated with the use of antibiotics. The choice of antibiotic and length of treatment depend on your symptoms and the type of bacteria causing your infection. HOME CARE INSTRUCTIONS  If you were prescribed antibiotics, take them exactly as your caregiver instructs you. Finish the medication even if you feel better after you  have only taken some of the medication.  Drink enough water and fluids to keep your urine clear or pale yellow.  Avoid caffeine, tea, and carbonated beverages. They tend to irritate your bladder.  Empty your bladder often. Avoid holding urine for long periods of time.  Empty your bladder before and after sexual intercourse.  After a bowel movement, women should cleanse from front to back. Use each tissue only once. SEEK MEDICAL CARE IF:   You have back pain.  You develop a fever.  Your symptoms do not begin to resolve within 3 days. SEEK IMMEDIATE MEDICAL CARE IF:   You have severe back pain or lower abdominal pain.  You develop chills.  You have nausea or vomiting.  You have continued burning or discomfort with urination. MAKE SURE YOU:   Understand these instructions.  Will watch your condition.  Will get help right away if you are not doing well or get worse. Document Released: 11/17/2004 Document Revised: 08/09/2011 Document Reviewed: 03/18/2011 [facility] Patient Information 2014 [place].  Drink a lot of water. F/U if worsens.

## 2013-03-24 NOTE — ED Provider Notes (Signed)
CSN: [id number]     Arrival date & time 03/24/13  1014 History   First MD Initiated Contact with Patient 03/24/13 1035     Chief Complaint  Patient presents with  . Urinary Tract Infection   (Consider location/radiation/quality/duration/timing/severity/associated sxs/prior Treatment) HPI Comments: Patient presents with a 1 day h/o dysuria, frequency and bladder "spasm" post voiding. No hematuria. Mild flank pain. No fever, chills, N or V. Recent post partum.  Patient is a 28 y.o. female presenting with urinary tract infection. The history is provided by the patient.  Urinary Tract Infection Pertinent negatives include no abdominal pain.    Past Medical History  Diagnosis Date  . Hypertension   . Ovarian cyst   . Abnormal Pap smear     colpo, ok since  . Chlamydia   . Headache(784.0)     OTC meds prn  . Lower back pain   . Leg pain   . Asthma    Past Surgical History  Procedure Laterality Date  . Cesarean section      x 2  . Oophrectomy  2007    Right , d/t cyst  . Diagnostic laparoscopy  2007  . Dilation and evacuation  10/27/2011    Procedure: DILATATION AND EVACUATION;  Surgeon: Betsy Coder, MD;  Location: [facility];  Service: Gynecology;  Laterality: N/A;  . Dilation and evacuation  12/04/2011    Procedure: DILATATION AND EVACUATION;  Surgeon: Betsy Coder, MD;  Location: [facility];  Service: Gynecology;  Laterality: N/A;  . Cesarean section N/A 02/27/2013    Procedure: CESAREAN SECTION with tubal ligation;  Surgeon: Betsy Coder, MD;  Location: [facility];  Service: Obstetrics;  Laterality: N/A;   Family History  Problem Relation Age of Onset  . Hypertension Mother   . Hypertension Father   . Other Neg Hx   . Diabetes Paternal Aunt   . Hypertension Maternal Grandmother   . Cancer Maternal Grandmother   . Hypertension Maternal Grandfather   . Hypertension Paternal Grandmother   . Hypertension Paternal Grandfather   . Mental retardation Maternal Uncle    History   Substance Use Topics  . Smoking status: Former Smoker -- 1.00 packs/day for 4 years    Types: Cigarettes    Quit date: 07/21/2011  . Smokeless tobacco: Never Used     Comment: quit with pos preg  . Alcohol Use: No   OB History   Grav Para Term Preterm Abortions TAB SAB Ect Mult Living   5 4 3 1 1  0 1 0 0 4     Review of Systems  Constitutional: Negative for fever and fatigue.  Gastrointestinal: Negative for nausea, vomiting, abdominal pain, diarrhea and abdominal distention.  Genitourinary: Positive for urgency, frequency and flank pain. Negative for hematuria, vaginal bleeding, vaginal discharge, menstrual problem and pelvic pain.  Neurological: Negative.   Psychiatric/Behavioral: Negative.     Allergies  Review of patient's allergies indicates no known allergies.  Home Medications   Current Outpatient Rx  Name  Route  Sig  Dispense  Refill  . butalbital-acetaminophen-caffeine (FIORICET, ESGIC) 50-325-40 MG per tablet   Oral   Take 2 tablets by mouth every 6 (six) hours as needed for headache.   14 tablet   0   . cephALEXin (KEFLEX) 500 MG capsule   Oral   Take 1 capsule (500 mg total) by mouth 2 (two) times daily.   20 capsule   0   . ibuprofen (ADVIL,MOTRIN) 600 MG tablet  Oral   Take 1 tablet (600 mg total) by mouth every 6 (six) hours.   30 tablet   0   . iron polysaccharides (NIFEREX) 150 MG capsule   Oral   Take 1 capsule (150 mg total) by mouth 2 (two) times daily with a meal.   60 capsule   2   . oxyCODONE-acetaminophen (PERCOCET/ROXICET) 5-325 MG per tablet   Oral   Take 1-2 tablets by mouth every 4 (four) hours as needed for severe pain (moderate - severe pain).   30 tablet   0   . Prenatal Vit-Fe Fumarate-FA (PRENATAL MULTIVITAMIN) TABS   Oral   Take 1 tablet by mouth daily.           BP 149/106  Pulse 75  Temp(Src) 98.7 F (37.1 C) (Oral)  Resp 18  SpO2 98%  LMP 05/22/2012  Breastfeeding? Yes Physical Exam  Nursing note and  vitals reviewed. Constitutional: She is oriented to person, place, and time. She appears well-developed and well-nourished. No distress.  Cardiovascular: Normal rate and regular rhythm.   Pulmonary/Chest: Effort normal and breath sounds normal. No respiratory distress.  Abdominal: Soft. Bowel sounds are normal. She exhibits no distension. There is tenderness. There is no rebound and no guarding.  Suprapubic tenderness with palpaiton  Neurological: She is alert and oriented to person, place, and time. No cranial nerve deficit.  Skin: Skin is warm and dry. She is not diaphoretic.  Psychiatric: Her behavior is normal.    ED Course  Procedures (including critical care time) Labs Review Labs Reviewed  POCT URINALYSIS DIP (DEVICE) - Abnormal; Notable for the following:    Glucose, UA 100 (*)    Hgb urine dipstick LARGE (*)    Protein, ur 30 (*)    Nitrite POSITIVE (*)    Leukocytes, UA SMALL (*)    All other components within normal limits  POCT PREGNANCY, URINE   Imaging Review No results found.    MDM   1. Urinary tract infection    Keflex-Safe with lactation-Avoid Azo unknown. Push fluids.     Bjorn Pippin, PA-C 03/24/13 1051

## 2013-07-15 ENCOUNTER — Emergency Department (INDEPENDENT_AMBULATORY_CARE_PROVIDER_SITE_OTHER)
Admission: EM | Admit: 2013-07-15 | Discharge: 2013-07-15 | Disposition: A | Discharge status: Home / Self Care | Payer: Self-pay | Source: Home / Self Care | Attending: Family Medicine | Admitting: Family Medicine

## 2013-07-15 ENCOUNTER — Encounter (HOSPITAL_COMMUNITY): Payer: Self-pay | Admitting: Emergency Medicine

## 2013-07-15 DIAGNOSIS — R319 Hematuria, unspecified: Secondary | ICD-10-CM

## 2013-07-15 DIAGNOSIS — N390 Urinary tract infection, site not specified: Secondary | ICD-10-CM

## 2013-07-15 LAB — POCT URINALYSIS DIP (DEVICE)
BILIRUBIN URINE: NEGATIVE
Glucose, UA: NEGATIVE mg/dL
LEUKOCYTES UA: NEGATIVE
Nitrite: NEGATIVE
Protein, ur: NEGATIVE mg/dL
Specific Gravity, Urine: 1.025 (ref 1.005–1.030)
UROBILINOGEN UA: 0.2 mg/dL (ref 0.0–1.0)
pH: 5.5 (ref 5.0–8.0)

## 2013-07-15 LAB — POCT PREGNANCY, URINE: Preg Test, Ur: NEGATIVE

## 2013-07-15 MED ORDER — CEPHALEXIN 500 MG PO CAPS: 500.0000 mg | ORAL_CAPSULE | Freq: Three times a day (TID) | ORAL | Status: DC

## 2013-07-15 NOTE — ED Provider Notes (Signed)
[name] is a 28 y.o. female who presents to Urgent Care today for hematuria associated with urinary frequency urgency and dysuria. Symptoms present since this morning. No fevers chills nausea vomiting or diarrhea. No significant flank or abdominal pain. Symptoms are moderate. No medications tried yet.   Past Medical History  Diagnosis Date  . Hypertension   . Ovarian cyst   . Abnormal Pap smear     colpo, ok since  . Chlamydia   . Headache(784.0)     OTC meds prn  . Lower back pain   . Leg pain   . Asthma    History  Substance Use Topics  . Smoking status: Former Smoker -- 1.00 packs/day for 4 years    Types: Cigarettes    Quit date: 07/21/2011  . Smokeless tobacco: Never Used     Comment: quit with pos preg  . Alcohol Use: No   ROS as above Medications: No current facility-administered medications for this encounter.   Current Outpatient Prescriptions  Medication Sig Dispense Refill  . butalbital-acetaminophen-caffeine (FIORICET, ESGIC) 50-325-40 MG per tablet Take 2 tablets by mouth every 6 (six) hours as needed for headache.  14 tablet  0  . cephALEXin (KEFLEX) 500 MG capsule Take 1 capsule (500 mg total) by mouth 3 (three) times daily.  21 capsule  0  . iron polysaccharides (NIFEREX) 150 MG capsule Take 1 capsule (150 mg total) by mouth 2 (two) times daily with a meal.  60 capsule  2    Exam:  BP 117/75  Pulse 83  Temp(Src) 98.2 F (36.8 C) (Oral)  Resp 16  SpO2 100%  Breastfeeding? No Gen: Well NAD HEENT: EOMI,  MMM Lungs: Normal work of breathing. CTABL Heart: RRR no MRG Abd: NABS, Soft. NT, ND mild left-sided CVA angle tenderness to percussion Exts: Brisk capillary refill, warm and well perfused.   Results for orders placed during the hospital encounter of 07/15/13 (from the past 24 hour(s))  POCT URINALYSIS DIP (DEVICE)     Status: Abnormal   Collection Time    07/15/13  2:50 PM      Result Value Ref Range   Glucose, UA NEGATIVE  NEGATIVE  mg/dL   Bilirubin Urine NEGATIVE  NEGATIVE   Ketones, ur TRACE (*) NEGATIVE mg/dL   Specific Gravity, Urine 1.025  1.005 - 1.030   Hgb urine dipstick MODERATE (*) NEGATIVE   pH 5.5  5.0 - 8.0   Protein, ur NEGATIVE  NEGATIVE mg/dL   Urobilinogen, UA 0.2  0.0 - 1.0 mg/dL   Nitrite NEGATIVE  NEGATIVE   Leukocytes, UA NEGATIVE  NEGATIVE  POCT PREGNANCY, URINE     Status: None   Collection Time    07/15/13  2:54 PM      Result Value Ref Range   Preg Test, Ur NEGATIVE  NEGATIVE   No results found.  Assessment and Plan: 28 y.o. female with hematuria. Possible UTI. Doubtful for pyelonephritis given no fever. Plan to treat with Keflex. Urine culture pending. Followup as needed.  Discussed warning signs or symptoms. Please see discharge instructions. Patient expresses understanding.    Gregor Hams, MD 07/15/13 [id number]

## 2013-07-15 NOTE — Discharge Instructions (Signed)
Thank you for coming in today. Take keflex three times daily for 1 week.  Come back as needed.  If your belly pain worsens, or you have high fever, bad vomiting, blood in your stool or black tarry stool go to the Emergency Room.   Urinary Tract Infection Urinary tract infections (UTIs) can develop anywhere along your urinary tract. Your urinary tract is your body's drainage system for removing wastes and extra water. Your urinary tract includes two kidneys, two ureters, a bladder, and a urethra. Your kidneys are a pair of bean-shaped organs. Each kidney is about the size of your fist. They are located below your ribs, one on each side of your spine. CAUSES Infections are caused by microbes, which are microscopic organisms, including fungi, viruses, and bacteria. These organisms are so small that they can only be seen through a microscope. Bacteria are the microbes that most commonly cause UTIs. SYMPTOMS  Symptoms of UTIs may vary by age and gender of the patient and by the location of the infection. Symptoms in young women typically include a frequent and intense urge to urinate and a painful, burning feeling in the bladder or urethra during urination. Older women and men are more likely to be tired, shaky, and weak and have muscle aches and abdominal pain. A fever may mean the infection is in your kidneys. Other symptoms of a kidney infection include pain in your back or sides below the ribs, nausea, and vomiting. DIAGNOSIS To diagnose a UTI, your caregiver will ask you about your symptoms. Your caregiver also will ask to provide a urine sample. The urine sample will be tested for bacteria and white blood cells. White blood cells are made by your body to help fight infection. TREATMENT  Typically, UTIs can be treated with medication. Because most UTIs are caused by a bacterial infection, they usually can be treated with the use of antibiotics. The choice of antibiotic and length of treatment depend on  your symptoms and the type of bacteria causing your infection. HOME CARE INSTRUCTIONS  If you were prescribed antibiotics, take them exactly as your caregiver instructs you. Finish the medication even if you feel better after you have only taken some of the medication.  Drink enough water and fluids to keep your urine clear or pale yellow.  Avoid caffeine, tea, and carbonated beverages. They tend to irritate your bladder.  Empty your bladder often. Avoid holding urine for long periods of time.  Empty your bladder before and after sexual intercourse.  After a bowel movement, women should cleanse from front to back. Use each tissue only once. SEEK MEDICAL CARE IF:   You have back pain.  You develop a fever.  Your symptoms do not begin to resolve within 3 days. SEEK IMMEDIATE MEDICAL CARE IF:   You have severe back pain or lower abdominal pain.  You develop chills.  You have nausea or vomiting.  You have continued burning or discomfort with urination. MAKE SURE YOU:   Understand these instructions.  Will watch your condition.  Will get help right away if you are not doing well or get worse. Document Released: 11/17/2004 Document Revised: 08/09/2011 Document Reviewed: 03/18/2011 [facility] Patient Information 2014 [place].   Hematuria, Adult Hematuria is blood in your urine. It can be caused by a bladder infection, kidney infection, prostate infection, kidney stone, or cancer of your urinary tract. Infections can usually be treated with medicine, and a kidney stone usually will pass through your urine. If neither  of these is the cause of your hematuria, further workup to find out the reason may be needed. It is very important that you tell your health care provider about any blood you see in your urine, even if the blood stops without treatment or happens without causing pain. Blood in your urine that happens and then stops and then happens again can be a symptom of a  very serious condition. Also, pain is not a symptom in the initial stages of many urinary cancers. HOME CARE INSTRUCTIONS   Drink lots of fluid, 3 4 quarts a day. If you have been diagnosed with an infection, cranberry juice is especially recommended, in addition to large amounts of water.  Avoid caffeine, tea, and carbonated beverages, because they tend to irritate the bladder.  Avoid alcohol because it may irritate the prostate.  Only take over-the-counter or prescription medicines for pain, discomfort, or fever as directed by your health care provider.  If you have been diagnosed with a kidney stone, follow your health care provider's instructions regarding straining your urine to catch the stone.  Empty your bladder often. Avoid holding urine for long periods of time.  After a bowel movement, women should cleanse front to back. Use each tissue only once.  Empty your bladder before and after sexual intercourse if you are a female. SEEK MEDICAL CARE IF: You develop back pain, fever, a feeling of sickness in your stomach (nausea), or vomiting or if your symptoms are not better in 3 days. Return sooner if you are getting worse. SEEK IMMEDIATE MEDICAL CARE IF:   You have a persistent fever, with a temperature of 101.89F (38.8C) or greater.  You develop severe vomiting and are unable to keep the medicine down.  You develop severe back or abdominal pain despite taking your medicines.  You begin passing a large amount of blood or clots in your urine.  You feel extremely weak or faint, or you pass out. MAKE SURE YOU:   Understand these instructions.  Will watch your condition.  Will get help right away if you are not doing well or get worse. Document Released: 02/07/2005 Document Revised: 11/28/2012 Document Reviewed: 10/08/2012 [facility] Patient Information 2014 [place].

## 2013-07-15 NOTE — ED Notes (Signed)
Pt c/o poss UTI onset this am Sx include back pain and hematuria ans dysuria Denies f/v/n/d Alert w/no signs of acute distress.

## 2013-07-17 LAB — URINE CULTURE

## 2013-07-17 NOTE — ED Notes (Signed)
Urine culture: 30,000 colonies E. Coli.  Pt. adequately treated with Keflex. Hanley Seamen [facility] 07/17/2013

## 2013-12-23 ENCOUNTER — Encounter (HOSPITAL_COMMUNITY): Payer: Self-pay | Admitting: Emergency Medicine

## 2014-03-09 ENCOUNTER — Encounter (HOSPITAL_COMMUNITY): Payer: Self-pay | Admitting: *Deleted

## 2014-03-09 ENCOUNTER — Inpatient Hospital Stay (HOSPITAL_COMMUNITY)
Admission: AD | Admit: 2014-03-09 | Discharge: 2014-03-09 | Disposition: A | Discharge status: Home / Self Care | Payer: Medicaid Other | Source: Ambulatory Visit | Attending: Obstetrics & Gynecology | Admitting: Obstetrics & Gynecology

## 2014-03-09 DIAGNOSIS — Z87891 Personal history of nicotine dependence: Secondary | ICD-10-CM | POA: Diagnosis not present

## 2014-03-09 DIAGNOSIS — A5901 Trichomonal vulvovaginitis: Secondary | ICD-10-CM | POA: Diagnosis not present

## 2014-03-09 DIAGNOSIS — R109 Unspecified abdominal pain: Secondary | ICD-10-CM | POA: Diagnosis present

## 2014-03-09 DIAGNOSIS — Z9851 Tubal ligation status: Secondary | ICD-10-CM | POA: Insufficient documentation

## 2014-03-09 LAB — URINALYSIS, ROUTINE W REFLEX MICROSCOPIC
Bilirubin Urine: NEGATIVE
Glucose, UA: NEGATIVE mg/dL
Ketones, ur: NEGATIVE mg/dL
Nitrite: NEGATIVE
PH: 6.5 (ref 5.0–8.0)
Protein, ur: NEGATIVE mg/dL
Specific Gravity, Urine: 1.025 (ref 1.005–1.030)
Urobilinogen, UA: 1 mg/dL (ref 0.0–1.0)

## 2014-03-09 LAB — WET PREP, GENITAL
Clue Cells Wet Prep HPF POC: NONE SEEN
Yeast Wet Prep HPF POC: NONE SEEN

## 2014-03-09 LAB — URINE MICROSCOPIC-ADD ON

## 2014-03-09 LAB — POCT PREGNANCY, URINE: Preg Test, Ur: NEGATIVE

## 2014-03-09 LAB — RAPID HIV SCREEN (WH-MAU): Rapid HIV Screen: NONREACTIVE

## 2014-03-09 MED ORDER — METRONIDAZOLE 500 MG PO TABS: 2000.0000 mg | ORAL_TABLET | Freq: Once | ORAL | Status: DC

## 2014-03-09 NOTE — Discharge Instructions (Signed)

## 2014-03-09 NOTE — MAU Note (Signed)
Pt presents to MAU with complaints of irregular vaginal bleeding over the last week with some lower abdominal pain. Reports she had her tubes tied January 7th 2015

## 2014-03-09 NOTE — MAU Provider Note (Signed)
CSN: [id number]     Arrival date & time 03/09/14  1345 History   None    Chief Complaint  Patient presents with  . Abdominal Pain  . Vaginal Bleeding     (Consider location/radiation/quality/duration/timing/severity/associated sxs/prior Treatment) HPI [name] is a 29 y.o. [id number] s/p BTL 1/15. She presents with c/o spotting off/on x 1 1/2 wks- is pink to red, mucoid.  She has low abd cramping off/on also.  No changes in odor, no itching. No UTI S&S or GI changes. Same partner x 1 yr, has not had STD screening yet, condoms occ. Past Medical History  Diagnosis Date  . Hypertension   . Ovarian cyst   . Abnormal Pap smear     colpo, ok since  . Chlamydia   . Headache(784.0)     OTC meds prn  . Lower back pain   . Leg pain   . Asthma    Past Surgical History  Procedure Laterality Date  . Cesarean section      x 2  . Oophrectomy  2007    Right , d/t cyst  . Diagnostic laparoscopy  2007  . Dilation and evacuation  10/27/2011    Procedure: DILATATION AND EVACUATION;  Surgeon: Betsy Coder, MD;  Location: [facility];  Service: Gynecology;  Laterality: N/A;  . Dilation and evacuation  12/04/2011    Procedure: DILATATION AND EVACUATION;  Surgeon: Betsy Coder, MD;  Location: [facility];  Service: Gynecology;  Laterality: N/A;  . Cesarean section N/A 02/27/2013    Procedure: CESAREAN SECTION with tubal ligation;  Surgeon: Betsy Coder, MD;  Location: [facility];  Service: Obstetrics;  Laterality: N/A;   Family History  Problem Relation Age of Onset  . Hypertension Mother   . Hypertension Father   . Other Neg Hx   . Diabetes Paternal Aunt   . Hypertension Maternal Grandmother   . Cancer Maternal Grandmother   . Hypertension Maternal Grandfather   . Hypertension Paternal Grandmother   . Hypertension Paternal Grandfather   . Mental retardation Maternal Uncle    History  Substance Use Topics  . Smoking status: Former Smoker -- 1.00 packs/day for 4 years    Types: Cigarettes     Quit date: 07/21/2011  . Smokeless tobacco: Never Used     Comment: quit with pos preg  . Alcohol Use: No   OB History    Gravida Para Term Preterm AB TAB SAB Ectopic Multiple Living   5 4 3 1 1  0 1 0 0 4     Review of Systems  Constitutional: Negative for fever and chills.  Gastrointestinal: Negative for nausea, vomiting, diarrhea and constipation.  Genitourinary: Positive for vaginal bleeding and pelvic pain. Negative for dysuria, urgency, frequency and vaginal discharge.      Allergies  Review of patient's allergies indicates no known allergies.  Home Medications   Prior to Admission medications   Medication Sig Start Date End Date Taking? Authorizing Provider  butalbital-acetaminophen-caffeine (FIORICET, ESGIC) [phone] MG per tablet Take 2 tablets by mouth every 6 (six) hours as needed for headache. 03/02/13   Charlane Ferretti, CNM  cephALEXin (KEFLEX) 500 MG capsule Take 1 capsule (500 mg total) by mouth 3 (three) times daily. 07/15/13   Gregor Hams, MD  iron polysaccharides (NIFEREX) 150 MG capsule Take 1 capsule (150 mg total) by mouth 2 (two) times daily with a meal. 03/02/13   Charlane Ferretti, CNM   BP 142/107 mmHg  Pulse 96  Temp(Src) 98.8 F (37.1 C) (Oral)  Resp 18  Ht 5\' 2"  (1.575 m)  Wt 66.679 kg (147 lb)  BMI 26.88 kg/m2  LMP 02/06/2014 Physical Exam  Constitutional: She is oriented to person, place, and time. She appears well-developed and well-nourished.  Abdominal: Soft. There is no tenderness.  Genitourinary:  Pelvic exam- Ext gen- nl anatomy, skin intact Vagina- mod amt rose colored creamy discharge Cx- parous Uterus-nl size. Non tender Adn- no masses, non tender  Musculoskeletal: Normal range of motion.  Neurological: She is alert and oriented to person, place, and time.  Skin: Skin is warm and dry.  Psychiatric: She has a normal mood and affect. Her behavior is normal.    ED Course  Procedures (including critical care time) Labs  Review Labs Reviewed  WET PREP, GENITAL  URINALYSIS, ROUTINE W REFLEX MICROSCOPIC  RAPID HIV SCREEN ([facility])  POCT PREGNANCY, URINE  GC/CHLAMYDIA PROBE AMP ([place])   Results for orders placed or performed during the hospital encounter of 03/09/14 (from the past 24 hour(s))  Urinalysis, Routine w reflex microscopic     Status: Abnormal   Collection Time: 03/09/14  2:00 PM  Result Value Ref Range   Color, Urine YELLOW YELLOW   APPearance CLEAR CLEAR   Specific Gravity, Urine 1.025 1.005 - 1.030   pH 6.5 5.0 - 8.0   Glucose, UA NEGATIVE NEGATIVE mg/dL   Hgb urine dipstick SMALL (A) NEGATIVE   Bilirubin Urine NEGATIVE NEGATIVE   Ketones, ur NEGATIVE NEGATIVE mg/dL   Protein, ur NEGATIVE NEGATIVE mg/dL   Urobilinogen, UA 1.0 0.0 - 1.0 mg/dL   Nitrite NEGATIVE NEGATIVE   Leukocytes, UA TRACE (A) NEGATIVE  Urine microscopic-add on     Status: Abnormal   Collection Time: 03/09/14  2:00 PM  Result Value Ref Range   Squamous Epithelial / LPF FEW (A) RARE   WBC, UA 3-6 <3 WBC/hpf   RBC / HPF 3-6 <3 RBC/hpf   Bacteria, UA FEW (A) RARE  Pregnancy, urine POC     Status: None   Collection Time: 03/09/14  2:29 PM  Result Value Ref Range   Preg Test, Ur NEGATIVE NEGATIVE  Wet prep, genital     Status: Abnormal   Collection Time: 03/09/14  2:51 PM  Result Value Ref Range   Yeast Wet Prep HPF POC NONE SEEN NONE SEEN   Trich, Wet Prep MODERATE (A) NONE SEEN   Clue Cells Wet Prep HPF POC NONE SEEN NONE SEEN   WBC, Wet Prep HPF POC MODERATE (A) NONE SEEN  Rapid HIV screen     Status: None   Collection Time: 03/09/14  2:55 PM  Result Value Ref Range   SUDS Rapid HIV Screen NON REACTIVE NON REACTIVE    Imaging Review No results found.   EKG Interpretation None      MDM   Final diagnoses:  None    A- Trichomonas  P-  Flagyl 2 gm dose Other labs pending Safe sex reviewed If abnormal bleeding continues to f/u with gyn

## 2014-03-10 LAB — GC/CHLAMYDIA PROBE AMP (~~LOC~~) NOT AT ARMC
Chlamydia: NEGATIVE
Neisseria Gonorrhea: NEGATIVE

## 2015-10-18 ENCOUNTER — Ambulatory Visit (HOSPITAL_COMMUNITY)
Admission: EM | Admit: 2015-10-18 | Discharge: 2015-10-18 | Disposition: A | Discharge status: Home / Self Care | Payer: Medicaid Other | Attending: Surgery | Admitting: Surgery

## 2015-10-18 ENCOUNTER — Encounter (HOSPITAL_COMMUNITY): Payer: Self-pay | Admitting: Emergency Medicine

## 2015-10-18 DIAGNOSIS — K611 Rectal abscess: Secondary | ICD-10-CM

## 2015-10-18 MED ORDER — LIDOCAINE HCL (PF) 2 % IJ SOLN
INTRAMUSCULAR | Status: AC
  Filled 2015-10-18: qty 2

## 2015-10-18 MED ORDER — SULFAMETHOXAZOLE-TRIMETHOPRIM 800-160 MG PO TABS: 1.0000 | ORAL_TABLET | Freq: Two times a day (BID) | ORAL | 0 refills | Status: AC

## 2015-10-18 MED ORDER — HYDROCODONE-ACETAMINOPHEN 10-325 MG PO TABS: 1.0000 | ORAL_TABLET | Freq: Four times a day (QID) | ORAL | 0 refills | Status: DC | PRN

## 2015-10-18 NOTE — ED Notes (Signed)
At bedside for provider assessment

## 2015-10-18 NOTE — ED Triage Notes (Signed)
Patient has a boil on right buttocks.  Onset 8/25.  No history of abscess

## 2015-10-18 NOTE — ED Notes (Signed)
At bedside for I/d of abscess.

## 2015-10-18 NOTE — ED Provider Notes (Signed)
[place]    CSN: [id number] Arrival date & time: 10/18/15  1523  First Provider Contact:  First MD Initiated Contact with Patient 10/18/15 1833        History   Chief Complaint Chief Complaint  Patient presents with  . Abscess    HPI [name] is a 30 y.o. female.   HPI Patient presents with the above complaint.  States that she had a pimple sized bump in the perirectal area on the left for several days and this has progressively gotten bigger and increasingly painful.  No drainage.  Pain with sitting and walking.  Denies fever, chills.  No problems of this nature before onset.   Past Medical History:  Diagnosis Date  . Abnormal Pap smear    colpo, ok since  . Asthma   . Chlamydia   . Headache(784.0)    OTC meds prn  . Hypertension   . Leg pain   . Lower back pain   . Ovarian cyst     Patient Active Problem List   Diagnosis Date Noted  . Cesarean delivery delivered 02/27/2013  . Vaginal bleeding in pregnancy 08/14/2012  . Hx of ovarian cyst 07/21/2012  . Previous cesarean delivery, antepartum condition or complication x 2 0000000  . Smoker 07/21/2012    Past Surgical History:  Procedure Laterality Date  . CESAREAN SECTION     x 2  . CESAREAN SECTION N/A 02/27/2013   Procedure: CESAREAN SECTION with tubal ligation;  Surgeon: Betsy Coder, MD;  Location: [facility];  Service: Obstetrics;  Laterality: N/A;  . DIAGNOSTIC LAPAROSCOPY  2007  . DILATION AND EVACUATION  10/27/2011   Procedure: DILATATION AND EVACUATION;  Surgeon: Betsy Coder, MD;  Location: [facility];  Service: Gynecology;  Laterality: N/A;  . DILATION AND EVACUATION  12/04/2011   Procedure: DILATATION AND EVACUATION;  Surgeon: Betsy Coder, MD;  Location: [facility];  Service: Gynecology;  Laterality: N/A;  . oophrectomy  2007   Right , d/t cyst    OB History    Gravida Para Term Preterm AB Living   5 4 3 1 1 4    SAB TAB Ectopic Multiple Live Births   1 0 0 0 4        Home Medications    Prior to Admission medications   Medication Sig Start Date End Date Taking? Authorizing Provider  butalbital-acetaminophen-caffeine (FIORICET, ESGIC) 50-325-40 MG per tablet Take 2 tablets by mouth every 6 (six) hours as needed for headache. Patient not taking: Reported on 03/09/2014 03/02/13   Arville Go, CNM  HYDROcodone-acetaminophen [facility]) 10-325 MG tablet Take 1 tablet by mouth every 6 (six) hours as needed. 10/18/15   Lanae Crumbly, PA-C  sulfamethoxazole-trimethoprim (BACTRIM DS,SEPTRA DS) 800-160 MG tablet Take 1 tablet by mouth 2 (two) times daily. 10/18/15 10/28/15  Lanae Crumbly, PA-C    Family History Family History  Problem Relation Age of Onset  . Hypertension Mother   . Hypertension Father   . Diabetes Paternal Aunt   . Hypertension Maternal Grandmother   . Cancer Maternal Grandmother   . Hypertension Maternal Grandfather   . Hypertension Paternal Grandmother   . Hypertension Paternal Grandfather   . Mental retardation Maternal Uncle   . Other Neg Hx     Social History Social History  Substance Use Topics  . Smoking status: Former Smoker    Packs/day: 1.00    Years: 4.00    Types: Cigarettes  Quit date: 07/21/2011  . Smokeless tobacco: Never Used     Comment: quit with pos preg  . Alcohol use No     Allergies   Review of patient's allergies indicates no known allergies.   Review of Systems Review of Systems  Constitutional: Negative.   HENT: Negative.   Eyes: Negative.   Respiratory: Negative.   Cardiovascular: Negative.   Gastrointestinal: Negative for abdominal pain and anal bleeding.  Genitourinary: Negative.   Musculoskeletal: Negative.   Neurological: Negative.   Hematological: Negative.   Psychiatric/Behavioral: Negative.      Physical Exam Triage Vital Signs ED Triage Vitals  Enc Vitals Group     BP 10/18/15 1700 (!) 162/109     Pulse Rate 10/18/15 1700 79     Resp 10/18/15 1700 18     Temp 10/18/15  1700 99.1 F (37.3 C)     Temp Source 10/18/15 1700 Oral     SpO2 10/18/15 1700 100 %     Weight 10/18/15 1700 140 lb (63.5 kg)     Height 10/18/15 1700 5\' 2"  (1.575 m)     Head Circumference --      Peak Flow --      Pain Score 10/18/15 1734 10     Pain Loc --      Pain Edu? --      Excl. in [place]? --    No data found.   Updated Vital Signs BP (!) 162/109 (BP Location: Right Arm) Comment: Pt states she has hypertension and not on medication  Pulse 79   Temp 99.1 F (37.3 C) (Oral)   Resp 18   Ht 5\' 2"  (1.575 m)   Wt 140 lb (63.5 kg)   SpO2 100%   BMI 25.61 kg/m   Visual Acuity Right Eye Distance:   Left Eye Distance:   Bilateral Distance:    Right Eye Near:   Left Eye Near:    Bilateral Near:     Physical Exam  Constitutional: She is oriented to person, place, and time. She appears well-developed and well-nourished. She appears distressed (uncomfortable with sitting. ).  HENT:  Head: Normocephalic and atraumatic.  Eyes: EOM are normal. Pupils are equal, round, and reactive to light.  Pulmonary/Chest: No respiratory distress.  Abdominal: She exhibits no distension.  Genitourinary:     Genitourinary Comments: Has about 2cm perirectal abscess that is markedly tender.  Some redness.  No cellulitis.    Neurological: She is alert and oriented to person, place, and time.  Skin: Skin is warm and dry.     UC Treatments / Results  Labs (all labs ordered are listed, but only abnormal results are displayed) Labs Reviewed - No data to display  EKG  EKG Interpretation None       Radiology No results found.  Procedures .Marland KitchenIncision and Drainage Date/Time: 10/18/2015 8:14 PM Performed by: Lanae Crumbly Authorized by: Lanae Crumbly   Consent:    Consent obtained:  Verbal   Consent given by:  Patient   Risks discussed:  Pain Location:    Type:  Abscess (perirectal)   Size:  2cm   Location:  Anogenital   Anogenital location:  Perirectal Pre-procedure  details:    Skin preparation:  Betadine Anesthesia (see MAR for exact dosages):    Anesthesia method:  Local infiltration   Local anesthetic:  Lidocaine 1% w/o epi Procedure type:    Complexity:  Complex Procedure details:    Incision types:  Single straight  Incision depth:  Dermal   Scalpel blade:  11   Wound management:  Debrided   Drainage:  Purulent and bloody   Drainage amount:  Moderate   Wound treatment:  Wound left open   Packing materials:  1/2 in iodoform gauze   Amount 1/2" iodoform:  60mm Post-procedure details:    Patient tolerance of procedure:  Tolerated well, no immediate complications   (including critical care time)  Medications Ordered in UC Medications - No data to display   Initial Impression / Assessment and Plan / UC Course  I have reviewed the triage vital signs and the nursing notes.  Pertinent labs & imaging results that were available during my care of the patient were reviewed by me and considered in my medical decision making (see chart for details).  Clinical Course      Final Clinical Impressions(s) / UC Diagnoses   Final diagnoses:  Perirectal abscess    New Prescriptions New Prescriptions   HYDROCODONE-ACETAMINOPHEN (NORCO) 10-325 MG TABLET    Take 1 tablet by mouth every 6 (six) hours as needed.   SULFAMETHOXAZOLE-TRIMETHOPRIM (BACTRIM DS,SEPTRA DS) 800-160 MG TABLET    Take 1 tablet by mouth 2 (two) times daily.  will have patient follow up here in the urgent care in 2 days for wound check.  Will return sooner if needed if she begins to have worsening symtpoms.  Out of work 3-4 days.  All questions answered.     Lanae Crumbly, PA-C 10/18/15 2020

## 2015-10-18 NOTE — Discharge Instructions (Signed)
Change dressing daily.    Return to urgent care in 2 days for wound check.  Return sooner if needed.

## 2015-10-20 ENCOUNTER — Encounter (HOSPITAL_COMMUNITY): Payer: Self-pay | Admitting: *Deleted

## 2015-10-20 ENCOUNTER — Ambulatory Visit (HOSPITAL_COMMUNITY)
Admission: EM | Admit: 2015-10-20 | Discharge: 2015-10-20 | Disposition: A | Discharge status: Home / Self Care | Payer: Medicaid Other | Attending: Family Medicine | Admitting: Family Medicine

## 2015-10-20 DIAGNOSIS — Z5189 Encounter for other specified aftercare: Secondary | ICD-10-CM

## 2015-10-20 DIAGNOSIS — Z4801 Encounter for change or removal of surgical wound dressing: Secondary | ICD-10-CM

## 2015-10-20 NOTE — ED Provider Notes (Signed)
CSN: [id number]     Arrival date & time 10/20/15  [id number] History   First MD Initiated Contact with Patient 10/20/15 1045     Chief Complaint  Patient presents with  . Abscess   (Consider location/radiation/quality/duration/timing/severity/associated sxs/prior Treatment) 30 year old female was in the urgent care 2 days ago for I&D of an abscess located just right of the perineum to the medial proximal thigh. Packing had been placed at the time of the procedure. Last night patient states she attempted to take the dressing out to take a shower and she pulled the packing out. Today the wound has closed there is a small clot at the incision site and is beginning to close off. States it is gradually feeling a little better but remains tender particularly with sitting and walking.      Past Medical History:  Diagnosis Date  . Abnormal Pap smear    colpo, ok since  . Asthma   . Chlamydia   . Headache(784.0)    OTC meds prn  . Hypertension   . Leg pain   . Lower back pain   . Ovarian cyst    Past Surgical History:  Procedure Laterality Date  . CESAREAN SECTION     x 2  . CESAREAN SECTION N/A 02/27/2013   Procedure: CESAREAN SECTION with tubal ligation;  Surgeon: Betsy Coder, MD;  Location: [facility];  Service: Obstetrics;  Laterality: N/A;  . DIAGNOSTIC LAPAROSCOPY  2007  . DILATION AND EVACUATION  10/27/2011   Procedure: DILATATION AND EVACUATION;  Surgeon: Betsy Coder, MD;  Location: [facility];  Service: Gynecology;  Laterality: N/A;  . DILATION AND EVACUATION  12/04/2011   Procedure: DILATATION AND EVACUATION;  Surgeon: Betsy Coder, MD;  Location: [facility];  Service: Gynecology;  Laterality: N/A;  . oophrectomy  2007   Right , d/t cyst   Family History  Problem Relation Age of Onset  . Hypertension Mother   . Hypertension Father   . Diabetes Paternal Aunt   . Hypertension Maternal Grandmother   . Cancer Maternal Grandmother   . Hypertension Maternal Grandfather   .  Hypertension Paternal Grandmother   . Hypertension Paternal Grandfather   . Mental retardation Maternal Uncle   . Other Neg Hx    Social History  Substance Use Topics  . Smoking status: Former Smoker    Packs/day: 1.00    Years: 4.00    Types: Cigarettes    Quit date: 07/21/2011  . Smokeless tobacco: Never Used     Comment: quit with pos preg  . Alcohol use No   OB History    Gravida Para Term Preterm AB Living   5 4 3 1 1 4    SAB TAB Ectopic Multiple Live Births   1 0 0 0 4     Review of Systems  Constitutional: Negative.   Gastrointestinal: Negative.   Genitourinary: Negative.   Skin:       As per history of present illness  Neurological: Negative.   Psychiatric/Behavioral: Negative.   All other systems reviewed and are negative.   Allergies  Review of patient's allergies indicates no known allergies.  Home Medications   Prior to Admission medications   Medication Sig Start Date End Date Taking? Authorizing Provider  sulfamethoxazole-trimethoprim (BACTRIM DS,SEPTRA DS) 800-160 MG tablet Take 1 tablet by mouth 2 (two) times daily. 10/18/15 10/28/15 Yes Lanae Crumbly, PA-C  butalbital-acetaminophen-caffeine (FIORICET, ESGIC) [phone] MG per tablet Take 2 tablets by mouth every 6 (  six) hours as needed for headache. Patient not taking: Reported on 03/09/2014 03/02/13   Arville Go, CNM  HYDROcodone-acetaminophen [facility]) 10-325 MG tablet Take 1 tablet by mouth every 6 (six) hours as needed. 10/18/15   Lanae Crumbly, PA-C   Meds Ordered and Administered this Visit  Medications - No data to display  BP 150/100 (BP Location: Right Arm)   Pulse 79   Temp 98 F (36.7 C) (Oral)   Resp 17   SpO2 100%  No data found.   Physical Exam  Constitutional: She is oriented to person, place, and time. She appears well-developed and well-nourished. No distress.  HENT:  Head: Normocephalic and atraumatic.  Neck: Neck supple.  Cardiovascular: Normal rate.   Pulmonary/Chest:  Effort normal.  Genitourinary:  Genitourinary Comments: Normal external female genitalia.  The lesion was covered with a bandage placed by the patient. Once removed palpation reveals approximately 5-6 mm of surrounding induration. No drainage. No purulence and very light in small area of erythema. No evidence of cellulitis.  Neurological: She is alert and oriented to person, place, and time.  Skin: Skin is warm and dry.  Psychiatric: She has a normal mood and affect.  Nursing note and vitals reviewed.   Urgent Care Course   Clinical Course    Procedures (including critical care time)  Labs Review Labs Reviewed - No data to display  Imaging Review No results found.   Visual Acuity Review  Right Eye Distance:   Left Eye Distance:   Bilateral Distance:    Right Eye Near:   Left Eye Near:    Bilateral Near:         MDM   1. Wound check, abscess    Wound appears to be healing well. Return as needed and continue with her medication. Keep the wound covered for the next 2 or 3 days until it has healed. Wash with warm soap and water and apply warm compresses several times a day as possible. Continue taking your antibiotic until completed. For any worsening or new symptoms may return.     [name], NP 10/20/15 1104

## 2015-10-20 NOTE — ED Triage Notes (Signed)
Patient is here for follow up to abscess recheck to right left groin, states the area has not increased in size, has not had fever, and denies increase in drainage.

## 2015-10-20 NOTE — Discharge Instructions (Signed)
Keep the wound covered for the next 2 or 3 days until it has healed. Wash with warm soap and water and apply warm compresses several times a day as possible. Continue taking your antibiotic until completed. For any worsening or new symptoms may return.

## 2015-10-20 NOTE — ED Notes (Signed)
Assisted NP in examination of abscess. No drainage noted, no redness, or increase in swelling. New dressing placed and patient given supplies for dressing changes at home. Patient verbalized understanding of discharge instructions.

## 2018-08-06 ENCOUNTER — Ambulatory Visit: Payer: Self-pay | Admitting: Family Medicine

## 2018-10-30 ENCOUNTER — Encounter (HOSPITAL_COMMUNITY): Payer: Self-pay | Admitting: Emergency Medicine

## 2018-10-30 ENCOUNTER — Emergency Department (HOSPITAL_COMMUNITY)
Admission: EM | Admit: 2018-10-30 | Discharge: 2018-10-30 | Disposition: A | Discharge status: Home / Self Care | Payer: Managed Care, Other (non HMO) | Attending: Emergency Medicine | Admitting: Emergency Medicine

## 2018-10-30 ENCOUNTER — Emergency Department (HOSPITAL_COMMUNITY): Payer: Managed Care, Other (non HMO)

## 2018-10-30 ENCOUNTER — Other Ambulatory Visit: Payer: Self-pay

## 2018-10-30 DIAGNOSIS — N839 Noninflammatory disorder of ovary, fallopian tube and broad ligament, unspecified: Secondary | ICD-10-CM | POA: Diagnosis not present

## 2018-10-30 DIAGNOSIS — R1030 Lower abdominal pain, unspecified: Secondary | ICD-10-CM | POA: Diagnosis present

## 2018-10-30 DIAGNOSIS — Z87891 Personal history of nicotine dependence: Secondary | ICD-10-CM | POA: Diagnosis not present

## 2018-10-30 DIAGNOSIS — Z9114 Patient's other noncompliance with medication regimen: Secondary | ICD-10-CM | POA: Diagnosis not present

## 2018-10-30 DIAGNOSIS — I10 Essential (primary) hypertension: Secondary | ICD-10-CM | POA: Diagnosis not present

## 2018-10-30 DIAGNOSIS — J45909 Unspecified asthma, uncomplicated: Secondary | ICD-10-CM | POA: Diagnosis not present

## 2018-10-30 DIAGNOSIS — N838 Other noninflammatory disorders of ovary, fallopian tube and broad ligament: Secondary | ICD-10-CM

## 2018-10-30 LAB — URINALYSIS, ROUTINE W REFLEX MICROSCOPIC
Bacteria, UA: NONE SEEN
Bilirubin Urine: NEGATIVE
Glucose, UA: NEGATIVE mg/dL
Ketones, ur: NEGATIVE mg/dL
Leukocytes,Ua: NEGATIVE
Nitrite: NEGATIVE
Protein, ur: NEGATIVE mg/dL
Specific Gravity, Urine: 1.017 (ref 1.005–1.030)
pH: 6 (ref 5.0–8.0)

## 2018-10-30 LAB — COMPREHENSIVE METABOLIC PANEL
ALT: 13 U/L (ref 0–44)
AST: 26 U/L (ref 15–41)
Albumin: 3.7 g/dL (ref 3.5–5.0)
Alkaline Phosphatase: 48 U/L (ref 38–126)
Anion gap: 9 (ref 5–15)
BUN: 11 mg/dL (ref 6–20)
CO2: 20 mmol/L — ABNORMAL LOW (ref 22–32)
Calcium: 8.9 mg/dL (ref 8.9–10.3)
Chloride: 107 mmol/L (ref 98–111)
Creatinine, Ser: 0.83 mg/dL (ref 0.44–1.00)
GFR calc Af Amer: >60 mL/min (ref >60)
GFR calc non Af Amer: >60 mL/min (ref >60)
Glucose, Bld: 96 mg/dL (ref 70–99)
Potassium: 3.9 mmol/L (ref 3.5–5.1)
Sodium: 136 mmol/L (ref 135–145)
Total Bilirubin: 0.4 mg/dL (ref 0.3–1.2)
Total Protein: 7.2 g/dL (ref 6.5–8.1)

## 2018-10-30 LAB — CBC WITH DIFFERENTIAL/PLATELET
Abs Immature Granulocytes: 0.01 10*3/uL (ref 0.00–0.07)
Basophils Absolute: 0 10*3/uL (ref 0.0–0.1)
Basophils Relative: 1 %
Eosinophils Absolute: 0.1 10*3/uL (ref 0.0–0.5)
Eosinophils Relative: 2 %
HCT: 37.5 % (ref 36.0–46.0)
Hemoglobin: 11.6 g/dL — ABNORMAL LOW (ref 12.0–15.0)
Immature Granulocytes: 0 %
Lymphocytes Relative: 27 %
Lymphs Abs: 1.4 10*3/uL (ref 0.7–4.0)
MCH: 25.1 pg — ABNORMAL LOW (ref 26.0–34.0)
MCHC: 30.9 g/dL (ref 30.0–36.0)
MCV: 81.2 fL (ref 80.0–100.0)
Monocytes Absolute: 0.6 10*3/uL (ref 0.1–1.0)
Monocytes Relative: 12 %
Neutro Abs: 3 10*3/uL (ref 1.7–7.7)
Neutrophils Relative %: 58 %
Platelets: 243 10*3/uL (ref 150–400)
RBC: 4.62 MIL/uL (ref 3.87–5.11)
RDW: 16 % — ABNORMAL HIGH (ref 11.5–15.5)
WBC: 5.2 10*3/uL (ref 4.0–10.5)
nRBC: 0 % (ref 0.0–0.2)

## 2018-10-30 LAB — LIPASE, BLOOD: Lipase: 28 U/L (ref 11–51)

## 2018-10-30 LAB — I-STAT BETA HCG BLOOD, ED (MC, WL, AP ONLY): I-stat hCG, quantitative: <5 m[IU]/mL (ref <5)

## 2018-10-30 MED ORDER — NAPROXEN 500 MG PO TABS: 500.0000 mg | ORAL_TABLET | Freq: Two times a day (BID) | ORAL | 0 refills | Status: AC

## 2018-10-30 MED ORDER — KETOROLAC TROMETHAMINE 30 MG/ML IJ SOLN
30.0000 mg | Freq: Once | INTRAMUSCULAR | Status: AC
  Administered 2018-10-30: 30 mg via INTRAMUSCULAR
  Filled 2018-10-30: qty 1

## 2018-10-30 NOTE — Discharge Instructions (Addendum)
A short course of anti-inflammatories has been prescribed for you, please take 1 tablet twice a day for the next 7 days.  The number to Dr. Rosana Hoes is attached to your chart, please schedule an appointment for further evaluation of your lower abdominal pain.  If you experience any fever, worsening symptoms, nausea or vomiting please return to the emergency department.

## 2018-10-30 NOTE — ED Triage Notes (Signed)
Pt states that she was getting ready for work this morning and developed some lower abd pain , no nausea , no burning or frequency on urination , no abnormal bleeding

## 2018-10-30 NOTE — ED Provider Notes (Signed)
[facility] EMERGENCY DEPARTMENT Provider Note   CSN: [id number] Arrival date & time: 10/30/18  0725     History   Chief Complaint No chief complaint on file.   HPI [name] is a 33 y.o. female.     33 y.o female with a PMH of Chlamydia, HTN (non complaint), presents to the ED with a chief complaint of lower abdominal pressure x6 hours ago.  Patient reports getting ready for work this morning when she suddenly began to feel a intermittent pressure to her lower abdomen with some radiation onto the left side.  Reports she took some Excedrin extra strength without improvement in symptoms.  Reports this pain is not exacerbated or alleviated with any changes.  She reports her last bowel movement was yesterday, no blood in her stool.  Her last menstrual period was August 17 of 2020, with normal cycle.  She denies any urinary symptoms, nausea, vomiting, diarrhea, fevers.  No vaginal discharge, vaginal bleeding, is currently sexually active with 1 partner for the past 4 years, shows no concern for sexually transmitted infection.  The history is provided by the patient.    Past Medical History:  Diagnosis Date   Abnormal Pap smear    colpo, ok since   Asthma    Chlamydia    Headache(784.0)    OTC meds prn   Hypertension    Leg pain    Lower back pain    Ovarian cyst     Patient Active Problem List   Diagnosis Date Noted   Cesarean delivery delivered 02/27/2013   Vaginal bleeding in pregnancy 08/14/2012   Hx of ovarian cyst 07/21/2012   Previous cesarean delivery, antepartum condition or complication x 2 0000000   Smoker 07/21/2012    Past Surgical History:  Procedure Laterality Date   CESAREAN SECTION     x 2   CESAREAN SECTION N/A 02/27/2013   Procedure: CESAREAN SECTION with tubal ligation;  Surgeon: Betsy Coder, MD;  Location: [facility];  Service: Obstetrics;  Laterality: N/A;   DIAGNOSTIC LAPAROSCOPY  2007   DILATION AND  EVACUATION  10/27/2011   Procedure: DILATATION AND EVACUATION;  Surgeon: Betsy Coder, MD;  Location: [facility];  Service: Gynecology;  Laterality: N/A;   DILATION AND EVACUATION  12/04/2011   Procedure: DILATATION AND EVACUATION;  Surgeon: Betsy Coder, MD;  Location: [facility];  Service: Gynecology;  Laterality: N/A;   oophrectomy  2007   Right , d/t cyst     OB History    Gravida  5   Para  4   Term  3   Preterm  1   AB  1   Living  4     SAB  1   TAB  0   Ectopic  0   Multiple  0   Live Births  4            Home Medications    Prior to Admission medications   Medication Sig Start Date End Date Taking? Authorizing Provider  naproxen (NAPROSYN) 500 MG tablet Take 1 tablet (500 mg total) by mouth 2 (two) times daily for 7 days. 10/30/18 11/06/18  Janeece Fitting, PA-C    Family History Family History  Problem Relation Age of Onset   Hypertension Mother    Hypertension Father    Diabetes Paternal Aunt    Hypertension Maternal Grandmother    Cancer Maternal Grandmother    Hypertension Maternal Grandfather  Hypertension Paternal Grandmother    Hypertension Paternal Grandfather    Mental retardation Maternal Uncle    Other Neg Hx     Social History Social History   Tobacco Use   Smoking status: Former Smoker    Packs/day: 1.00    Years: 4.00    Pack years: 4.00    Types: Cigarettes    Quit date: 07/21/2011    Years since quitting: 7.2   Smokeless tobacco: Never Used   Tobacco comment: quit with pos preg  Substance Use Topics   Alcohol use: No   Drug use: No     Allergies   Patient has no known allergies.   Review of Systems Review of Systems  Constitutional: Negative for chills and fever.  HENT: Negative for ear pain and sore throat.   Eyes: Negative for pain and visual disturbance.  Respiratory: Negative for cough and shortness of breath.   Cardiovascular: Negative for chest pain and palpitations.  Gastrointestinal:  Positive for abdominal pain. Negative for blood in stool, constipation, nausea and vomiting.  Genitourinary: Negative for dysuria, flank pain and hematuria.  Musculoskeletal: Negative for arthralgias and back pain.  Skin: Negative for color change and rash.  Neurological: Negative for seizures and syncope.  All other systems reviewed and are negative.    Physical Exam Updated Vital Signs BP (!) 135/97 (BP Location: Left Arm)    Pulse 65    Temp 98.7 F (37.1 C) (Oral)    Resp 16    Ht 5\' 2"  (1.575 m)    Wt 77.1 kg    SpO2 100%    BMI 31.09 kg/m   Physical Exam Vitals signs and nursing note reviewed.  Constitutional:      General: She is not in acute distress.    Appearance: She is well-developed.     Comments: Non ill appearing.   HENT:     Head: Normocephalic and atraumatic.     Mouth/Throat:     Pharynx: No oropharyngeal exudate.  Eyes:     Pupils: Pupils are equal, round, and reactive to light.  Neck:     Musculoskeletal: Normal range of motion.  Cardiovascular:     Rate and Rhythm: Regular rhythm.     Heart sounds: Normal heart sounds.  Pulmonary:     Effort: Pulmonary effort is normal. No respiratory distress.     Breath sounds: Normal breath sounds.     Comments: Lungs are clear to auscultation without any wheezing, rales, rhonchi. Abdominal:     General: Bowel sounds are normal. There is no distension.     Palpations: Abdomen is soft.     Tenderness: There is no abdominal tenderness. There is no right CVA tenderness or left CVA tenderness.     Comments: Abdomen appears soft, bowel sounds present, no tenderness, guarding on my exam.  No CVA bilateral.  Musculoskeletal:        General: No tenderness or deformity.     Right lower leg: No edema.     Left lower leg: No edema.  Skin:    General: Skin is warm and dry.  Neurological:     Mental Status: She is alert and oriented to person, place, and time.      ED Treatments / Results  Labs (all labs ordered are  listed, but only abnormal results are displayed) Labs Reviewed  COMPREHENSIVE METABOLIC PANEL - Abnormal; Notable for the following components:      Result Value   CO2 20 (*)  All other components within normal limits  CBC WITH DIFFERENTIAL/PLATELET - Abnormal; Notable for the following components:   Hemoglobin 11.6 (*)    MCH 25.1 (*)    RDW 16.0 (*)    All other components within normal limits  URINALYSIS, ROUTINE W REFLEX MICROSCOPIC - Abnormal; Notable for the following components:   Hgb urine dipstick MODERATE (*)    All other components within normal limits  LIPASE, BLOOD  I-STAT BETA HCG BLOOD, ED ([facility], [facility], [facility] ONLY)    EKG None  Radiology Ct Renal Stone Study  Result Date: 10/30/2018 CLINICAL DATA:  Abdominal/flank pain EXAM: CT ABDOMEN AND PELVIS WITHOUT CONTRAST TECHNIQUE: Multidetector CT imaging of the abdomen and pelvis was performed following the standard protocol without oral or IV contrast. COMPARISON:  None. FINDINGS: Lower chest: Lung bases are clear. Hepatobiliary: No focal liver lesions are evident on this noncontrast enhanced study. Gallbladder wall is not appreciably thickened. There is no biliary duct dilatation. Pancreas: There is no pancreatic mass or inflammatory focus. Spleen: No splenic lesions are evident. Adrenals/Urinary Tract: Adrenals bilaterally appear normal. There is slight nephrocalcinosis bilaterally. There is no evident renal mass or hydronephrosis on either side. There is no focal renal or ureteral calculus on either side. Urinary bladder is midline with wall thickness within normal limits. There is extrinsic compression on the superior aspect of the bladder due to a large adnexal mass. Stomach/Bowel: There is no appreciable bowel wall or mesenteric thickening. No evident bowel obstruction. Terminal ileum appears unremarkable. There is no evident free air or portal venous air. Vascular/Lymphatic: There is no abdominal aortic aneurysm. No vascular lesions  are evident on this noncontrast enhanced study. No adenopathy is appreciable in the abdomen or pelvis. Reproductive: Uterus is anteverted. There is a mass in the midline of the pelvis which is predominantly cystic, measuring 11.0 x 8.7 x 9.8 cm. Along the rightward aspect of this predominantly cystic mass, there are foci of increased attenuation, largest measuring 2.1 x 2.0 x 1.8 cm. Adjacent increased attenuation lesion within this larger cystic mass measures 2.5 x 1.9 x 1.8 cm. No other focal lesions evident in the pelvis on this noncontrast enhanced study. Other: Appendix appears unremarkable. No abscess or ascites evident in the abdomen or pelvis. Musculoskeletal: No blastic or lytic bone lesions. No intramuscular or abdominal wall lesions are evident. IMPRESSION: 1. There is a predominantly cystic mass in the midline of the pelvis which is likely of ovarian etiology and may arise from the right ovary. This dominant cystic mass measures 11.0 x 8.7 x 9.8 cm. There are 2 foci of increased attenuation along the rightward aspect of this lesion consistent with either foci of hemorrhage or possibly areas of hypercellularity. Differential considerations include foci of hemorrhage within a large ovarian cyst versus ovarian neoplasm with hemorrhage or solid masslike areas arising on the right. Correlation pelvic ultrasound may be helpful for further assessment in this regard. Note that there is extrinsic compression on the urinary bladder from this large mass. 2. No renal or ureteral calculi. No hydronephrosis on either side. Urinary bladder wall does not appear appreciably thickened. There is slight nephrocalcinosis bilaterally. 3. No bowel obstruction. No abscess in the abdomen or pelvis. Appendix appears normal. Electronically Signed   By: Lowella Grip III M.D.   On: 10/30/2018 13:00    Procedures Procedures (including critical care time)  Medications Ordered in ED Medications  ketorolac (TORADOL) 30  MG/ML injection 30 mg (30 mg Intramuscular Given 10/30/18 0928)  Initial Impression / Assessment and Plan / ED Course  I have reviewed the triage vital signs and the nursing notes.  Pertinent labs & imaging results that were available during my care of the patient were reviewed by me and considered in my medical decision making (see chart for details).    Patient with a past medical history of hypertension noncompliant with medication presents to the ED with complaints of sudden onset of lower abdominal pain, this feels like pressure which began 6 hours ago prior to arrival.  Has taken some Excedrin extra strength without improvement in symptoms.  She also reports no alleviating or exacerbating factors.  During primary evaluation patient is non-ill-appearing, denies any nausea, vomiting, diarrhea.  Vital signs are within normal limits aside from her elevated high blood pressure which she reports no compliance with medication.  Abdomen appears soft, without distention or tenderness or guarding on my exam.  There is no CVA bilaterally.  She denies any vaginal bleeding, vaginal discharge, low suspicion for any STIs, she does report the same partner for the past 4 years without any concerns for sexually transmitted infections.  CBC showed no leukocytosis, hemoglobin slightly decreased, she has a previous history of anemia and this is consistent with her previous visits.  She does not have any shortness of breath, dizziness.  CMP showed no electrolyte derangement, LFTs are within normal limits, creatinine level is normal.  Lipase level is unremarkable.  hCG is negative.  Urinalysis does show moderate hemoglobin, no nitrites, leukocytes, white blood cell count.  Low suspicion for any nephrolithiasis, she does not report any pain along her flank region, has no problems with urination she has dysuria.  Low suspicion for any urinary tract infection.  Will provide patient with Toradol to help with her pain  prior to reassessment. She is not having any nausea, vomiting, LFTs are within normal limits, low suspicion for any gallbladder pathology.  1:39 PM patient was reassessed by me after receiving Toradol, she reports some improvement of pain along her lower abdomen, she does report she felt that this was somewhat a kidney stone although she has no prior history of kidney stones.  I do see patient has a history of ovarian cyst.  Discussed further imaging with her, patient is agreeable to a CT renal at this time.  Patient otherwise with stable vital signs aside from her uncontrolled hypertension.  CT renal showed: 1. There is a predominantly cystic mass in the midline of the pelvis  which is likely of ovarian etiology and may arise from the right  ovary. This dominant cystic mass measures 11.0 x 8.7 x 9.8 cm. There  are 2 foci of increased attenuation along the rightward aspect of  this lesion consistent with either foci of hemorrhage or possibly  areas of hypercellularity. Differential considerations include foci  of hemorrhage within a large ovarian cyst versus ovarian neoplasm  with hemorrhage or solid masslike areas arising on the right.  Correlation pelvic ultrasound may be helpful for further assessment  in this regard. Note that there is extrinsic compression on the  urinary bladder from this large mass.    2. No renal or ureteral calculi. No hydronephrosis on either side.  Urinary bladder wall does not appear appreciably thickened. There is  slight nephrocalcinosis bilaterally.    3. No bowel obstruction. No abscess in the abdomen or pelvis.  Appendix appears normal.     1:39 PM spoke to Dr. Rosana Hoes, GYN on call, patient is stable to have  ultrasound done on an outpatient basis, she will follow-up in office with them.  She also reported nursing coordinator will call patient to set up appointment.  Patient will also be provided with her contact information.  She also go home on a short  course of anti-inflammatories to help with her pain.  Patient understands and agrees with management, vital signs are within normal limits, remains afebrile, non-surgical abdomen.  Patient stable for discharge.  Return precautions provided at length.  Portions of this note were generated with [employer]. Dictation errors may occur despite best attempts at proofreading.  Final Clinical Impressions(s) / ED Diagnoses   Final diagnoses:  Lower abdominal pain  Mass of right ovary    ED Discharge Orders         Ordered    naproxen (NAPROSYN) 500 MG tablet  2 times daily     10/30/18 1341           Janeece Fitting, PA-C 10/30/18 1342    Daleen Bo, MD 10/31/18 [phone]

## 2018-11-22 ENCOUNTER — Telehealth: Payer: Self-pay

## 2018-11-22 ENCOUNTER — Other Ambulatory Visit (HOSPITAL_COMMUNITY)
Admission: RE | Admit: 2018-11-22 | Discharge: 2018-11-22 | Disposition: A | Discharge status: Home / Self Care | Payer: Managed Care, Other (non HMO) | Source: Ambulatory Visit | Attending: Obstetrics and Gynecology | Admitting: Obstetrics and Gynecology

## 2018-11-22 ENCOUNTER — Encounter: Payer: Self-pay | Admitting: Obstetrics and Gynecology

## 2018-11-22 ENCOUNTER — Other Ambulatory Visit: Payer: Self-pay

## 2018-11-22 ENCOUNTER — Ambulatory Visit (INDEPENDENT_AMBULATORY_CARE_PROVIDER_SITE_OTHER): Payer: Self-pay | Admitting: Obstetrics and Gynecology

## 2018-11-22 VITALS — BP 150/106 | HR 87 | Ht 62.0 in | Wt 169.3 lb

## 2018-11-22 DIAGNOSIS — Z124 Encounter for screening for malignant neoplasm of cervix: Secondary | ICD-10-CM

## 2018-11-22 DIAGNOSIS — I10 Essential (primary) hypertension: Secondary | ICD-10-CM

## 2018-11-22 DIAGNOSIS — N838 Other noninflammatory disorders of ovary, fallopian tube and broad ligament: Secondary | ICD-10-CM | POA: Insufficient documentation

## 2018-11-22 DIAGNOSIS — Z113 Encounter for screening for infections with a predominantly sexual mode of transmission: Secondary | ICD-10-CM | POA: Insufficient documentation

## 2018-11-22 NOTE — Addendum Note (Signed)
Addended by: Bethanne Ginger on: 11/22/2018 04:53 PM   Modules accepted: Orders

## 2018-11-22 NOTE — Progress Notes (Signed)
  Pt states pain  starts on left side & then moves to her right side.

## 2018-11-22 NOTE — Progress Notes (Signed)
GYNECOLOGY OFFICE FOLLOW UP NOTE  History:  33 y.o. [id number] here today for follow up for pelvic mass seen on CT in ED. Feels stabbing and aching pain a few times a week, can last all day. Nothing improves except pain meds. Nothing worsens it. Starts in LLQ and radiates to RLQ. Has been happening for "a while", thought it was period pain, but went to ED because it was really bad pain. Has monthly regular periods, lasting ~7 days. Heavy at first.   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 S/p tubal ligation with CS in 2014.  Past Medical History:  Diagnosis Date   Abnormal Pap smear    colpo, ok since   Asthma    Chlamydia    Headache(784.0)    OTC meds prn   Hypertension    Leg pain    Lower back pain    Ovarian cyst     Past Surgical History:  Procedure Laterality Date   CESAREAN SECTION     x 2   CESAREAN SECTION N/A 02/27/2013   Procedure: CESAREAN SECTION with tubal ligation;  Surgeon: Betsy Coder, MD;  Location: [facility];  Service: Obstetrics;  Laterality: N/A;   DIAGNOSTIC LAPAROSCOPY  2007   DILATION AND EVACUATION  10/27/2011   Procedure: DILATATION AND EVACUATION;  Surgeon: Betsy Coder, MD;  Location: [facility];  Service: Gynecology;  Laterality: N/A;   DILATION AND EVACUATION  12/04/2011   Procedure: DILATATION AND EVACUATION;  Surgeon: Betsy Coder, MD;  Location: [facility];  Service: Gynecology;  Laterality: N/A;   oophrectomy  2007   Right , d/t cyst    No current outpatient medications on file.  The following portions of the patient's history were reviewed and updated as appropriate: allergies, current medications, past family history, past medical history, past social history, past surgical history and problem list.   Review of Systems:  Pertinent items noted in HPI and remainder of comprehensive ROS otherwise negative.   Objective:  Physical Exam BP (!) 150/106 (BP Location: Right Arm)    Pulse 87    Ht 5\' 2"  (1.575 m)     Wt 169 lb 4.8 oz (76.8 kg)    LMP 11/15/2018 (Approximate)    BMI 30.97 kg/m  CONSTITUTIONAL: Well-developed, well-nourished female in no acute distress.  HENT:  Normocephalic, atraumatic. External right and left ear normal. Oropharynx is clear and moist EYES: Conjunctivae and EOM are normal. Pupils are equal, round, and reactive to light. No scleral icterus.  NECK: Normal range of motion, supple, no masses SKIN: Skin is warm and dry. No rash noted. Not diaphoretic. No erythema. No pallor. NEUROLOGIC: Alert and oriented to person, place, and time. Normal reflexes, muscle tone coordination. No cranial nerve deficit noted. PSYCHIATRIC: Normal mood and affect. Normal behavior. Normal judgment and thought content. CARDIOVASCULAR: Normal heart rate noted RESPIRATORY: Effort normal, no problems with respiration noted ABDOMEN: Soft, no distention noted. Mildly tender in RLQ  PELVIC: Normal appearing external genitalia; normal appearing vaginal mucosa and cervix.  No abnormal discharge noted.  Pap smear obtained.  pelvic cultures obtained. Mildly enlarged uterine size, no other palpable masses, no real uterine or adnexal tenderness. MUSCULOSKELETAL: Normal range of motion. No edema noted.  Exam done with chaperone present.  Labs and Imaging Ct Renal Stone Study  Addendum Date: 10/30/2018   ADDENDUM REPORT: 10/30/2018 17:19 ADDENDUM: It should be noted that an ovarian dermoid which is predominantly cystic potentially could present in this manner and  is a differential consideration. Electronically Signed   By: Lowella Grip III M.D.   On: 10/30/2018 17:19   Result Date: 10/30/2018 CLINICAL DATA:  Abdominal/flank pain EXAM: CT ABDOMEN AND PELVIS WITHOUT CONTRAST TECHNIQUE: Multidetector CT imaging of the abdomen and pelvis was performed following the standard protocol without oral or IV contrast. COMPARISON:  None. FINDINGS: Lower chest: Lung bases are clear. Hepatobiliary: No focal liver lesions are  evident on this noncontrast enhanced study. Gallbladder wall is not appreciably thickened. There is no biliary duct dilatation. Pancreas: There is no pancreatic mass or inflammatory focus. Spleen: No splenic lesions are evident. Adrenals/Urinary Tract: Adrenals bilaterally appear normal. There is slight nephrocalcinosis bilaterally. There is no evident renal mass or hydronephrosis on either side. There is no focal renal or ureteral calculus on either side. Urinary bladder is midline with wall thickness within normal limits. There is extrinsic compression on the superior aspect of the bladder due to a large adnexal mass. Stomach/Bowel: There is no appreciable bowel wall or mesenteric thickening. No evident bowel obstruction. Terminal ileum appears unremarkable. There is no evident free air or portal venous air. Vascular/Lymphatic: There is no abdominal aortic aneurysm. No vascular lesions are evident on this noncontrast enhanced study. No adenopathy is appreciable in the abdomen or pelvis. Reproductive: Uterus is anteverted. There is a mass in the midline of the pelvis which is predominantly cystic, measuring 11.0 x 8.7 x 9.8 cm. Along the rightward aspect of this predominantly cystic mass, there are foci of increased attenuation, largest measuring 2.1 x 2.0 x 1.8 cm. Adjacent increased attenuation lesion within this larger cystic mass measures 2.5 x 1.9 x 1.8 cm. No other focal lesions evident in the pelvis on this noncontrast enhanced study. Other: Appendix appears unremarkable. No abscess or ascites evident in the abdomen or pelvis. Musculoskeletal: No blastic or lytic bone lesions. No intramuscular or abdominal wall lesions are evident. IMPRESSION: 1. There is a predominantly cystic mass in the midline of the pelvis which is likely of ovarian etiology and may arise from the right ovary. This dominant cystic mass measures 11.0 x 8.7 x 9.8 cm. There are 2 foci of increased attenuation along the rightward aspect of  this lesion consistent with either foci of hemorrhage or possibly areas of hypercellularity. Differential considerations include foci of hemorrhage within a large ovarian cyst versus ovarian neoplasm with hemorrhage or solid masslike areas arising on the right. Correlation pelvic ultrasound may be helpful for further assessment in this regard. Note that there is extrinsic compression on the urinary bladder from this large mass. 2. No renal or ureteral calculi. No hydronephrosis on either side. Urinary bladder wall does not appear appreciably thickened. There is slight nephrocalcinosis bilaterally. 3. No bowel obstruction. No abscess in the abdomen or pelvis. Appendix appears normal. Electronically Signed: By: Lowella Grip III M.D. On: 10/30/2018 13:00    Assessment & Plan:   1. Ovarian mass Reviewed possible etiologies including ovarian cyst, dermoid, hemorrhagic cyst, low likelihood of malignancy - need f/u imaging - will base further management on f/u - US PELVIC COMPLETE WITH TRANSVAGINAL; Future  2. Routine screening for STI (sexually transmitted infection) - HIV antibody (with reflex) - RPR - Hepatitis C Antibody - Hepatitis B surface antigen  3. Cervical cancer screening - Cervicovaginal ancillary only( [place]) - Cytology - PAP( [place])   Routine preventative health maintenance measures emphasized. Please refer to After Visit Summary for other counseling recommendations.   Return for will contact patient with results  for follow up.  Total face-to-face time with patient: 30 minutes. Over 50% of encounter was spent on counseling and coordination of care.  Feliz Beam, M.D. Attending Center for [employer] [facility])

## 2018-11-22 NOTE — Telephone Encounter (Signed)
Called pt to advise of appt at [facility] on 01/30/19 @ 3:30p, received message that call cn not go thru, call back later. Will send My Chart message.

## 2018-11-23 LAB — HEPATITIS C ANTIBODY: Hep C Virus Ab: <0.1 s/co ratio (ref 0.0–0.9)

## 2018-11-23 LAB — HEPATITIS B SURFACE ANTIGEN: Hepatitis B Surface Ag: NEGATIVE

## 2018-11-23 LAB — CA 125: Cancer Antigen (CA) 125: 26.3 U/mL (ref 0.0–38.1)

## 2018-11-23 LAB — CERVICOVAGINAL ANCILLARY ONLY
Chlamydia: NEGATIVE
Neisseria Gonorrhea: NEGATIVE
Trichomonas: NEGATIVE

## 2018-11-23 LAB — HIV ANTIBODY (ROUTINE TESTING W REFLEX): HIV Screen 4th Generation wRfx: NONREACTIVE

## 2018-11-23 LAB — RPR: RPR Ser Ql: NONREACTIVE

## 2018-11-28 LAB — CYTOLOGY - PAP
Diagnosis: NEGATIVE
High risk HPV: NEGATIVE

## 2018-11-29 ENCOUNTER — Other Ambulatory Visit: Payer: Self-pay

## 2018-11-29 ENCOUNTER — Ambulatory Visit (HOSPITAL_COMMUNITY)
Admission: RE | Admit: 2018-11-29 | Discharge: 2018-11-29 | Disposition: A | Discharge status: Home / Self Care | Payer: Managed Care, Other (non HMO) | Source: Ambulatory Visit | Attending: Obstetrics and Gynecology | Admitting: Obstetrics and Gynecology

## 2018-11-29 DIAGNOSIS — N838 Other noninflammatory disorders of ovary, fallopian tube and broad ligament: Secondary | ICD-10-CM | POA: Insufficient documentation

## 2018-12-03 ENCOUNTER — Telehealth: Payer: Self-pay | Admitting: Obstetrics and Gynecology

## 2018-12-03 NOTE — Telephone Encounter (Signed)
Called patient regarding need for MRI with cystic ? Ovarian mass on [place] and to discuss need for further imaging. No answer, will try again later.   Feliz Beam, M.D. Attending Center for [employer] farm manager)

## 2018-12-05 ENCOUNTER — Telehealth: Payer: Self-pay | Admitting: Lactation Services

## 2018-12-05 ENCOUNTER — Telehealth: Payer: Self-pay | Admitting: Obstetrics and Gynecology

## 2018-12-05 NOTE — Telephone Encounter (Signed)
-----   Message from Sloan Leiter, MD sent at 12/05/2018 11:40 AM EDT ----- Please schedule patient for MRI, she is expecting your call

## 2018-12-05 NOTE — Addendum Note (Signed)
Addended by: Vivien Rota on: 12/05/2018 11:40 AM   Modules accepted: Orders

## 2018-12-05 NOTE — Telephone Encounter (Signed)
Called patient regarding [place] results, need for MRI. She verbalizes understanding, will await RN call to schedule MRI. Answered all questions.   Feliz Beam, M.D. Attending Center for [employer] farm manager)

## 2018-12-05 NOTE — Telephone Encounter (Signed)
Called to schedule Pelvic MRI with and without Contrast per Dr. Rosana Hoes.   MRI Scheduled for 10/22 4 pm at [facility] entrance then once screened for Covid will proceed to 1st floor Radiology. Pt asked to arrive at 3:30.   Attempted to call pt at 2:52 pm, no answer. LM for pt to call the office for appt details.  LM that I would send a message to her via MyChart also.

## 2018-12-06 ENCOUNTER — Encounter: Payer: Self-pay | Admitting: Lactation Services

## 2018-12-06 NOTE — Telephone Encounter (Signed)
Attempted to call pt with MRI appt. Was not able to leave a message as mailbox was full. Pt was sent a My Chart message yesterday with appointment information and location.

## 2018-12-12 NOTE — Progress Notes (Signed)
[name] from front office came to me stating that the pt wanted to speak with someone about her pain.  Pt last saw Dr. Rosana Hoes on 11/22/18.  Notified Dr. Rosana Hoes who stated that nothing more stronger for pain medication.  Provider recommended that pt should take Tylenol or Ibuprofen for the pain.    Mel Almond, RN 12/12/18

## 2018-12-13 ENCOUNTER — Ambulatory Visit (HOSPITAL_COMMUNITY): Payer: Managed Care, Other (non HMO)

## 2018-12-13 ENCOUNTER — Telehealth: Payer: Self-pay | Admitting: Family Medicine

## 2018-12-13 NOTE — Telephone Encounter (Signed)
Spoke to patient on 10/21 about why her MRI was canceled. Patient instructed that her appointment was cancelled because her [employer] insurance required her to go through [facility] for to have this MRI done. Patient stated that she does not have transportation to go to [place] or [place] to have this imaging done. Patient informed because we are out of network she would have to pay the full price for the MRI. Patient stated she would give her Medicaid a call to see if she can get something other than family planning and she would give [place] a call back. I verbalized understanding.

## 2018-12-14 ENCOUNTER — Telehealth: Payer: Self-pay | Admitting: Family Medicine

## 2018-12-14 NOTE — Telephone Encounter (Signed)
Patient called back stating that she spoke with Medicaid and they approved her to have the MRI done and it will take 24-48 hours to reflect on your account. I verbalized understanding and got patient rescheduled for her MRI ( 11/13 @ 9:00). Patient instructed to arrive at 8:30. Patient verbalized understanding.

## 2019-01-03 ENCOUNTER — Telehealth: Payer: Self-pay | Admitting: Lactation Services

## 2019-01-03 NOTE — Telephone Encounter (Signed)
[name] called from [facility]. Pt has MRI scheduled tomorrow and needs a Pre Auth prior to scan. Message sent to front office to complete Pre Auth.

## 2019-01-04 ENCOUNTER — Ambulatory Visit (HOSPITAL_COMMUNITY): Payer: Managed Care, Other (non HMO)

## 2019-01-11 ENCOUNTER — Other Ambulatory Visit: Payer: Self-pay | Admitting: Obstetrics and Gynecology

## 2019-01-11 DIAGNOSIS — Z01419 Encounter for gynecological examination (general) (routine) without abnormal findings: Secondary | ICD-10-CM

## 2019-01-12 ENCOUNTER — Ambulatory Visit (HOSPITAL_COMMUNITY): Payer: Managed Care, Other (non HMO)

## 2019-01-18 DIAGNOSIS — N838 Other noninflammatory disorders of ovary, fallopian tube and broad ligament: Secondary | ICD-10-CM | POA: Diagnosis not present

## 2019-01-18 LAB — CREATININE, SERUM
Creatinine, Ser: 0.91 mg/dL (ref 0.44–1.00)
GFR calc Af Amer: >60 mL/min (ref >60)
GFR calc non Af Amer: >60 mL/min (ref >60)

## 2019-01-19 ENCOUNTER — Ambulatory Visit (HOSPITAL_COMMUNITY)
Admission: RE | Admit: 2019-01-19 | Discharge: 2019-01-19 | Disposition: A | Discharge status: Home / Self Care | Payer: Managed Care, Other (non HMO) | Source: Ambulatory Visit | Attending: Obstetrics and Gynecology | Admitting: Obstetrics and Gynecology

## 2019-01-19 DIAGNOSIS — N838 Other noninflammatory disorders of ovary, fallopian tube and broad ligament: Secondary | ICD-10-CM | POA: Insufficient documentation

## 2019-01-19 MED ORDER — GADOBUTROL 1 MMOL/ML IV SOLN
8.0000 mL | Freq: Once | INTRAVENOUS | Status: AC | PRN
  Administered 2019-01-19: 8 mL via INTRAVENOUS

## 2019-01-30 ENCOUNTER — Ambulatory Visit: Payer: Medicaid Other

## 2019-02-20 ENCOUNTER — Telehealth: Payer: Self-pay

## 2019-02-20 NOTE — Telephone Encounter (Signed)
Called pt and LM that I am calling in regards to non urgent results and that we have scheduled a f/u appt with Dr. Rosana Hoes on 03/13/19.  If you could please call the office.

## 2019-02-21 NOTE — Telephone Encounter (Signed)
I called Aniaya back and informed her Dr. Rosana Hoes wanted her to know she had reviewed her MRi results and it shows unchanged presumably ovarian mass and she wants to have her come in office to discuss face to face. Will discuss results and if there is any treatment recommended. I reviewed date/ time of appointment. She also states she called a few weeks ago about pain around her cycle- and asked for pain meds; but no one called her back. Per chart review I informed her Dr.Davis did not recommend any pain meds - only tylenol or ibuprofen. She states tylenol does not help. I informed her per our protocol she can take Ibuprofen 800 mg every 8 hours otc or I can send in RX. She states she will try otc first. I advised her to take with foods, and drink a lot of water while on ibuprofen. She c/o numbness in legs sometimes. I informed her that is likely not related to her cycle but perhaps the mass and do discuss with provider at her visit.  Linda,RN

## 2019-03-13 ENCOUNTER — Ambulatory Visit: Payer: Managed Care, Other (non HMO) | Admitting: Obstetrics and Gynecology

## 2019-03-21 ENCOUNTER — Other Ambulatory Visit: Payer: Self-pay

## 2019-03-21 ENCOUNTER — Emergency Department (HOSPITAL_COMMUNITY)
Admission: EM | Admit: 2019-03-21 | Discharge: 2019-03-21 | Disposition: A | Discharge status: Home / Self Care | Payer: Managed Care, Other (non HMO) | Attending: Emergency Medicine | Admitting: Emergency Medicine

## 2019-03-21 ENCOUNTER — Encounter (HOSPITAL_COMMUNITY): Payer: Self-pay | Admitting: Emergency Medicine

## 2019-03-21 DIAGNOSIS — N83202 Unspecified ovarian cyst, left side: Secondary | ICD-10-CM | POA: Diagnosis not present

## 2019-03-21 DIAGNOSIS — Z87891 Personal history of nicotine dependence: Secondary | ICD-10-CM | POA: Diagnosis not present

## 2019-03-21 DIAGNOSIS — R1030 Lower abdominal pain, unspecified: Secondary | ICD-10-CM | POA: Diagnosis not present

## 2019-03-21 DIAGNOSIS — N939 Abnormal uterine and vaginal bleeding, unspecified: Secondary | ICD-10-CM | POA: Diagnosis not present

## 2019-03-21 DIAGNOSIS — N760 Acute vaginitis: Secondary | ICD-10-CM

## 2019-03-21 DIAGNOSIS — I10 Essential (primary) hypertension: Secondary | ICD-10-CM | POA: Insufficient documentation

## 2019-03-21 DIAGNOSIS — A64 Unspecified sexually transmitted disease: Secondary | ICD-10-CM

## 2019-03-21 DIAGNOSIS — J45909 Unspecified asthma, uncomplicated: Secondary | ICD-10-CM | POA: Diagnosis not present

## 2019-03-21 DIAGNOSIS — N9489 Other specified conditions associated with female genital organs and menstrual cycle: Secondary | ICD-10-CM

## 2019-03-21 LAB — COMPREHENSIVE METABOLIC PANEL
ALT: 14 U/L (ref 0–44)
AST: 28 U/L (ref 15–41)
Albumin: 4 g/dL (ref 3.5–5.0)
Alkaline Phosphatase: 56 U/L (ref 38–126)
Anion gap: 9 (ref 5–15)
BUN: 13 mg/dL (ref 6–20)
CO2: 23 mmol/L (ref 22–32)
Calcium: 9.3 mg/dL (ref 8.9–10.3)
Chloride: 106 mmol/L (ref 98–111)
Creatinine, Ser: 0.79 mg/dL (ref 0.44–1.00)
GFR calc Af Amer: >60 mL/min (ref >60)
GFR calc non Af Amer: >60 mL/min (ref >60)
Glucose, Bld: 111 mg/dL — ABNORMAL HIGH (ref 70–99)
Potassium: 3.9 mmol/L (ref 3.5–5.1)
Sodium: 138 mmol/L (ref 135–145)
Total Bilirubin: 0.5 mg/dL (ref 0.3–1.2)
Total Protein: 7.1 g/dL (ref 6.5–8.1)

## 2019-03-21 LAB — I-STAT BETA HCG BLOOD, ED (MC, WL, AP ONLY): I-stat hCG, quantitative: <5 m[IU]/mL (ref <5)

## 2019-03-21 LAB — LIPASE, BLOOD: Lipase: 30 U/L (ref 11–51)

## 2019-03-21 LAB — URINALYSIS, ROUTINE W REFLEX MICROSCOPIC
Bilirubin Urine: NEGATIVE
Glucose, UA: NEGATIVE mg/dL
Ketones, ur: NEGATIVE mg/dL
Leukocytes,Ua: NEGATIVE
Nitrite: NEGATIVE
Protein, ur: NEGATIVE mg/dL
Specific Gravity, Urine: 1.016 (ref 1.005–1.030)
pH: 9 — ABNORMAL HIGH (ref 5.0–8.0)

## 2019-03-21 LAB — WET PREP, GENITAL
Sperm: NONE SEEN
Trich, Wet Prep: NONE SEEN
Yeast Wet Prep HPF POC: NONE SEEN

## 2019-03-21 LAB — CBC
HCT: 35.2 % — ABNORMAL LOW (ref 36.0–46.0)
Hemoglobin: 11.2 g/dL — ABNORMAL LOW (ref 12.0–15.0)
MCH: 24.6 pg — ABNORMAL LOW (ref 26.0–34.0)
MCHC: 31.8 g/dL (ref 30.0–36.0)
MCV: 77.2 fL — ABNORMAL LOW (ref 80.0–100.0)
Platelets: 261 10*3/uL (ref 150–400)
RBC: 4.56 MIL/uL (ref 3.87–5.11)
RDW: 16.3 % — ABNORMAL HIGH (ref 11.5–15.5)
WBC: 6.5 10*3/uL (ref 4.0–10.5)
nRBC: 0 % (ref 0.0–0.2)

## 2019-03-21 LAB — HIV ANTIBODY (ROUTINE TESTING W REFLEX): HIV Screen 4th Generation wRfx: NONREACTIVE

## 2019-03-21 MED ORDER — METRONIDAZOLE 500 MG PO TABS: 500.0000 mg | ORAL_TABLET | Freq: Two times a day (BID) | ORAL | 0 refills | Status: DC

## 2019-03-21 MED ORDER — DOXYCYCLINE HYCLATE 100 MG PO CAPS: 100.0000 mg | ORAL_CAPSULE | Freq: Two times a day (BID) | ORAL | 0 refills | Status: DC

## 2019-03-21 MED ORDER — STERILE WATER FOR INJECTION IJ SOLN
INTRAMUSCULAR | Status: AC
  Filled 2019-03-21: qty 10

## 2019-03-21 MED ORDER — SODIUM CHLORIDE 0.9% FLUSH: 3.0000 mL | Freq: Once | INTRAVENOUS | Status: DC

## 2019-03-21 MED ORDER — CEFTRIAXONE SODIUM 500 MG IJ SOLR
500.0000 mg | Freq: Once | INTRAMUSCULAR | Status: AC
  Administered 2019-03-21: 500 mg via INTRAMUSCULAR
  Filled 2019-03-21: qty 500

## 2019-03-21 NOTE — ED Notes (Signed)
Pt educated on prescriptions, sexual health practices, and pain control at home.

## 2019-03-21 NOTE — ED Provider Notes (Signed)
[facility] EMERGENCY DEPARTMENT Provider Note   CSN: [id number] Arrival date & time: 03/21/19  1953     History Chief Complaint  Patient presents with  . Abdominal Pain    [name] is a 34 y.o. female.  HPI She complains of lower abdominal pain with spotting of blood, started today.  She is worried she has an STD.  She had a similar problem previously, when she had trichomonas.  Periods are usually at the first of the month, very regular.  She denies fever, chills, nausea, vomiting, cough, shortness of breath, weakness or dizziness.  There are no other new modifying factors.    Past Medical History:  Diagnosis Date  . Abnormal Pap smear    colpo, ok since  . Asthma   . Chlamydia   . Headache(784.0)    OTC meds prn  . Hypertension   . Leg pain   . Lower back pain   . Ovarian cyst     Patient Active Problem List   Diagnosis Date Noted  . Cesarean delivery delivered 02/27/2013  . Vaginal bleeding in pregnancy 08/14/2012  . Hx of ovarian cyst 07/21/2012  . Previous cesarean delivery, antepartum condition or complication x 2 0000000  . Smoker 07/21/2012    Past Surgical History:  Procedure Laterality Date  . CESAREAN SECTION     x 2  . CESAREAN SECTION N/A 02/27/2013   Procedure: CESAREAN SECTION with tubal ligation;  Surgeon: Betsy Coder, MD;  Location: [facility];  Service: Obstetrics;  Laterality: N/A;  . DIAGNOSTIC LAPAROSCOPY  2007  . DILATION AND EVACUATION  10/27/2011   Procedure: DILATATION AND EVACUATION;  Surgeon: Betsy Coder, MD;  Location: [facility];  Service: Gynecology;  Laterality: N/A;  . DILATION AND EVACUATION  12/04/2011   Procedure: DILATATION AND EVACUATION;  Surgeon: Betsy Coder, MD;  Location: [facility];  Service: Gynecology;  Laterality: N/A;  . oophrectomy  2007   Right , d/t cyst     OB History    Gravida  5   Para  4   Term  3   Preterm  1   AB  1   Living  4     SAB  1   TAB  0   Ectopic    0   Multiple  0   Live Births  4           Family History  Problem Relation Age of Onset  . Hypertension Mother   . Hypertension Father   . Diabetes Paternal Aunt   . Hypertension Maternal Grandmother   . Cancer Maternal Grandmother   . Hypertension Maternal Grandfather   . Hypertension Paternal Grandmother   . Hypertension Paternal Grandfather   . Mental retardation Maternal Uncle   . Other Neg Hx     Social History   Tobacco Use  . Smoking status: Former Smoker    Packs/day: 1.00    Years: 4.00    Pack years: 4.00    Types: Cigarettes    Quit date: 07/21/2011    Years since quitting: 7.6  . Smokeless tobacco: Never Used  . Tobacco comment: quit with pos preg  Substance Use Topics  . Alcohol use: No  . Drug use: No    Home Medications Prior to Admission medications   Not on File    Allergies    Patient has no known allergies.  Review of Systems   Review of Systems  All  other systems reviewed and are negative.   Physical Exam Updated Vital Signs BP (!) 161/131 (BP Location: Left Arm)   Pulse 96   Temp 98.4 F (36.9 C) (Oral)   Resp 18   LMP 03/08/2019   SpO2 100%   Physical Exam Vitals and nursing note reviewed.  Constitutional:      General: She is not in acute distress.    Appearance: She is well-developed. She is not ill-appearing, toxic-appearing or diaphoretic.  HENT:     Head: Normocephalic and atraumatic.     Right Ear: External ear normal.     Left Ear: External ear normal.  Eyes:     Conjunctiva/sclera: Conjunctivae normal.     Pupils: Pupils are equal, round, and reactive to light.  Neck:     Trachea: Phonation normal.  Cardiovascular:     Rate and Rhythm: Normal rate and regular rhythm.     Heart sounds: Normal heart sounds.  Pulmonary:     Effort: Pulmonary effort is normal.     Breath sounds: Normal breath sounds.  Abdominal:     General: There is no distension.     Palpations: Abdomen is soft.     Tenderness: There  is no abdominal tenderness. There is no guarding.  Genitourinary:    Comments: No external female genitalia.  Small amount of opaque white-colored vaginal discharge.  Minimal amount of blood present.  Cervix appears normal.  On bimanual examination, uterus is anteroflexed, and there is fullness in the left adnexal region, possibly consistent with uterine fibroid versus ovarian cyst.  Patient states that she has previously had an ovarian cyst on the left side. Musculoskeletal:        General: Normal range of motion.     Cervical back: Normal range of motion and neck supple.  Skin:    General: Skin is warm and dry.  Neurological:     Mental Status: She is alert and oriented to person, place, and time.     Cranial Nerves: No cranial nerve deficit.     Sensory: No sensory deficit.     Motor: No abnormal muscle tone.     Coordination: Coordination normal.  Psychiatric:        Mood and Affect: Mood normal.        Behavior: Behavior normal.        Thought Content: Thought content normal.        Judgment: Judgment normal.     ED Results / Procedures / Treatments   Labs (all labs ordered are listed, but only abnormal results are displayed) Labs Reviewed  COMPREHENSIVE METABOLIC PANEL - Abnormal; Notable for the following components:      Result Value   Glucose, Bld 111 (*)    All other components within normal limits  CBC - Abnormal; Notable for the following components:   Hemoglobin 11.2 (*)    HCT 35.2 (*)    MCV 77.2 (*)    MCH 24.6 (*)    RDW 16.3 (*)    All other components within normal limits  URINALYSIS, ROUTINE W REFLEX MICROSCOPIC - Abnormal; Notable for the following components:   APPearance HAZY (*)    pH 9.0 (*)    Hgb urine dipstick SMALL (*)    Bacteria, UA RARE (*)    All other components within normal limits  LIPASE, BLOOD  I-STAT BETA HCG BLOOD, ED ([facility], [facility], [facility] ONLY)    EKG None  Radiology No results found.  Procedures Procedures (including  critical care  time)  Medications Ordered in ED Medications  sodium chloride flush (NS) 0.9 % injection 3 mL (has no administration in time range)    ED Course  I have reviewed the triage vital signs and the nursing notes.  Pertinent labs & imaging results that were available during my care of the patient were reviewed by me and considered in my medical decision making (see chart for details).  Clinical Course as of Mar 20 2240  Thu Mar 21, 2019  2229 Normal except presence of clue cells, and white cells.  Wet prep, genital(!) [EW]    Clinical Course User Index [EW] Daleen Bo, MD   MDM Rules/Calculators/A&P                       Patient Vitals for the past 24 hrs:  BP Temp Temp src Pulse Resp SpO2  03/21/19 2000 (!) 161/131 98.4 F (36.9 C) Oral 96 18 100 %    10:43 PM Reevaluation with update and discussion. After initial assessment and treatment, an updated evaluation reveals patient is comfortable this time and has no further complaints.  Findings discussed with the patient including recommended follow-up with her gynecologist as scheduled, next week regarding the ovarian cyst.  All questions answered. Daleen Bo   Medical Decision Making: Vaginal spotting with vaginal discharge, evaluation consistent with STD versus nonspecific vaginitis.  Patient covered for STD, with Rocephin and doxycycline.  Flagyl written for vaginitis.  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  The cyst has benign appearance per radiology interpretation.  Patient does need follow-up with GYN however, to make sure that it improves and there is no adverse outcome.  She is stable for discharge at this time.  CRITICAL CARE-no Performed by: Daleen Bo  Nursing Notes Reviewed/ Care Coordinated Applicable Imaging Reviewed Interpretation of Laboratory Data  incorporated into ED treatment  The patient appears reasonably screened and/or stabilized for discharge and I doubt any other medical condition or other [facility] requiring further screening, evaluation, or treatment in the ED at this time prior to discharge.  Plan: Home Medications-OTC analgesia of choice; Home Treatments-rest, gradual advance diet and activity; return here if the recommended treatment, does not improve the symptoms; Recommended follow up-GYN follow-up next week as scheduled.    Final Clinical Impression(s) / ED Diagnoses Final diagnoses:  None    Rx / DC Orders ED Discharge Orders    None       Daleen Bo, MD 03/21/19 2246

## 2019-03-21 NOTE — Discharge Instructions (Addendum)
We are treating you for vaginitis and possible STD.  Make sure you see Dr. Rip Harbour as scheduled for evaluation and treatment of the left ovarian cyst.  Use Tylenol, for pain.

## 2019-03-21 NOTE — ED Triage Notes (Signed)
Patient reports hypogastric pain yesterday and vaginal bleeding " spotting"  today , denies emesis or diarrhea , no fever or chills .

## 2019-03-22 LAB — RPR: RPR Ser Ql: NONREACTIVE

## 2019-03-25 LAB — GC/CHLAMYDIA PROBE AMP (~~LOC~~) NOT AT ARMC
Chlamydia: NEGATIVE
Neisseria Gonorrhea: NEGATIVE

## 2019-03-27 ENCOUNTER — Other Ambulatory Visit: Payer: Self-pay

## 2019-03-27 ENCOUNTER — Ambulatory Visit (INDEPENDENT_AMBULATORY_CARE_PROVIDER_SITE_OTHER): Payer: Managed Care, Other (non HMO) | Admitting: Obstetrics and Gynecology

## 2019-03-27 ENCOUNTER — Encounter: Payer: Self-pay | Admitting: Obstetrics and Gynecology

## 2019-03-27 DIAGNOSIS — N83202 Unspecified ovarian cyst, left side: Secondary | ICD-10-CM | POA: Insufficient documentation

## 2019-03-27 DIAGNOSIS — N838 Other noninflammatory disorders of ovary, fallopian tube and broad ligament: Secondary | ICD-10-CM | POA: Diagnosis not present

## 2019-03-27 NOTE — Progress Notes (Signed)
Ms Lattin presents is follow up  See previous notes Last seen by Dr. Rosana Hoes 10/20 for left ovarian cystic  mass U/S and MRI confirmed and suggestive of benign process CA 125 normal 10/20  S/P c section x 2, BTL, RSO  PE AF VSS Lungs clear Heart RRR Abd soft + BS GU deferred  A/P Left ovarian cystic mass Surgerical removal recommended and reviewed with pt. R/B/Post op care discussed Will recheck CA 125 to be sure stable. Will schedule surgery once returns.

## 2019-03-27 NOTE — Patient Instructions (Signed)
Exploratory Laparotomy, Adult, Care After °This sheet gives you information about how to care for yourself after your procedure. Your health care provider may also give you more specific instructions. If you have problems or questions, contact your health care provider. °What can I expect after the procedure? °After the procedure, it is common to have: °· Abdominal soreness. °· Fatigue. °· A sore throat from the tube in your throat. °· A lack of appetite. °Follow these instructions at home: °Medicines °· Take over-the-counter and prescription medicines only as told by your health care provider. °· If you were prescribed an antibiotic medicine, take it as told by your health care provider. Do not stop taking the antibiotic even if you start to feel better. °· Do not drive or operate heavy machinery while taking pain medicine. °· If you are taking prescription pain medicine, take actions to prevent or treat constipation. Your health care provider may recommend that you: °? Drink enough fluid to keep your urine pale yellow. °? Eat foods that are high in fiber, such as fresh fruits and vegetables, whole grains, and beans. °? Limit foods that are high in fat and processed sugars, such as fried or sweet foods. °? Take an over-the-counter or prescription medicine for constipation. Undergoing surgery and taking pain medicines can make constipation worse. °Incision care ° °· Follow instructions from your health care provider about how to take care of your incision. Make sure you: °? Wash your hands with soap and water before you change your bandage (dressing). If soap and water are not available, use hand sanitizer. °? Change your dressing as told by your health care provider. °? Leave stitches (sutures), skin glue, or adhesive strips in place. These skin closures may need to stay in place for 2 weeks or longer. If adhesive strip edges start to loosen and curl up, you may trim the loose edges. Do not remove adhesive strips  completely unless your health care provider tells you to do that. °· If you were sent home with a drain, follow instructions from your health care provider about how to care for it. °· Check your incision area every day for signs of infection. Check for: °? Redness, swelling, or pain. °? Fluid or blood. °? Warmth. °? Pus or a bad smell. °Activity ° °· Rest as told by your health care provider. °? Avoid sitting for a long time without moving. Get up to take short walks every 1-2 hours. This is important to improve blood flow and breathing. Ask for help if you feel weak or unsteady. °· Do not lift anything that is heavier than 5 lb (2.2 kg), or the limit that your health care provider tells you, until he or she says that it is safe. °· Ask your health care provider when you can start to do your usual activities again, such as driving, going back to work, and having sex. °Eating and drinking °· You may eat what you usually eat. Include lots of whole grains, fruits, and vegetables in your diet. This will help to prevent constipation. °· Drink enough fluid to keep your urine pale yellow. °Bathing °· Keep your incision clean and dry. Clean it as often as told by your health care provider: °? Gently wash the incision with soap and water. °? Rinse the incision with water to remove all soap. °? Pat the incision dry with a clean towel. Do not rub the incision. °· You may take showers after 48 hours. °· Do not take baths,   swim, or use a hot tub until your health care provider says it is okay to do so. °General instructions °· Do not use any products that contain nicotine or tobacco, such as cigarettes and e-cigarettes. These can delay healing after surgery. If you need help quitting, ask your health care provider. °· Wear compression stockings as told by your health care provider. These stockings help to prevent blood clots and reduce swelling in your legs. °· Keep all follow-up visits as told by your health care provider.  This is important. °Contact a health care provider if: °· You have a fever. °· You have chills. °· Your pain medicine is not helping. °· You have constipation or diarrhea. °· You have nausea or vomiting. °· You have drainage, redness, swelling, or pain at your incision site. °Get help right away if: °· Your pain is getting worse. °· You have not had a bowel movement for more than 3 days. °· You have ongoing (persistent) vomiting. °· The edges of your incision open up. °· You have warmth, tenderness, and swelling in your calf. °· You have trouble breathing. °· You have chest pain. °These symptoms may represent a serious problem that is an emergency. Do not wait to see if the symptoms will go away. Get medical help right away. Call your local emergency services (911 in the [place]). Do not drive yourself to the hospital. °Summary °· Abdominal soreness is common after exploratory laparotomy. Take over-the-counter and prescription pain medicines only as told by your health care provider. °· Follow instructions from your health care provider about how to take care of your incision. Do not take baths, swim, or use a hot tub until your health care provider says it is okay to do so. °· Watch for signs and symptoms of infection after surgery, including fever, chills, drainage from your incision, and worsening abdominal pain. °This information is not intended to replace advice given to you by your health care provider. Make sure you discuss any questions you have with your health care provider. °Document Revised: 04/02/2018 Document Reviewed: 02/17/2017 °Elsevier Patient Education © 2020 [employer] Inc[employer] ° °

## 2019-03-28 LAB — CA 125: Cancer Antigen (CA) 125: 17.6 U/mL (ref 0.0–38.1)

## 2019-05-01 ENCOUNTER — Telehealth: Payer: Self-pay | Admitting: Obstetrics and Gynecology

## 2019-05-01 NOTE — Telephone Encounter (Signed)
Attempted to reach patient about her surgery appointment. She called and stated she needed to reschedule due to childcare issues. A message was sent to [facility]. I called the patient to inform her if she has to reschedule the appointment, it will be rescheduled in July. I voicemail message was left on her phone to call the office.

## 2019-05-28 ENCOUNTER — Inpatient Hospital Stay: Admit: 2019-05-28 | Payer: Managed Care, Other (non HMO) | Admitting: Obstetrics and Gynecology

## 2019-05-28 SURGERY — LAPAROTOMY
Anesthesia: Choice | Laterality: Left

## 2019-07-24 ENCOUNTER — Encounter (HOSPITAL_COMMUNITY): Payer: Self-pay

## 2019-07-24 ENCOUNTER — Encounter (HOSPITAL_COMMUNITY)
Admission: RE | Admit: 2019-07-24 | Discharge: 2019-07-24 | Disposition: A | Discharge status: Home / Self Care | Payer: Managed Care, Other (non HMO) | Source: Ambulatory Visit | Attending: Obstetrics and Gynecology | Admitting: Obstetrics and Gynecology

## 2019-07-24 ENCOUNTER — Other Ambulatory Visit: Payer: Self-pay

## 2019-07-24 DIAGNOSIS — Z79899 Other long term (current) drug therapy: Secondary | ICD-10-CM | POA: Diagnosis not present

## 2019-07-24 DIAGNOSIS — N83202 Unspecified ovarian cyst, left side: Secondary | ICD-10-CM | POA: Insufficient documentation

## 2019-07-24 DIAGNOSIS — I10 Essential (primary) hypertension: Secondary | ICD-10-CM | POA: Diagnosis not present

## 2019-07-24 DIAGNOSIS — Z01818 Encounter for other preprocedural examination: Secondary | ICD-10-CM | POA: Insufficient documentation

## 2019-07-24 DIAGNOSIS — F17210 Nicotine dependence, cigarettes, uncomplicated: Secondary | ICD-10-CM | POA: Insufficient documentation

## 2019-07-24 DIAGNOSIS — Z7982 Long term (current) use of aspirin: Secondary | ICD-10-CM | POA: Diagnosis not present

## 2019-07-24 LAB — TYPE AND SCREEN
ABO/RH(D): O POS
Antibody Screen: NEGATIVE

## 2019-07-24 LAB — BASIC METABOLIC PANEL
Anion gap: 8 (ref 5–15)
BUN: 12 mg/dL (ref 6–20)
CO2: 24 mmol/L (ref 22–32)
Calcium: 9.4 mg/dL (ref 8.9–10.3)
Chloride: 105 mmol/L (ref 98–111)
Creatinine, Ser: 0.82 mg/dL (ref 0.44–1.00)
GFR calc Af Amer: >60 mL/min (ref >60)
GFR calc non Af Amer: >60 mL/min (ref >60)
Glucose, Bld: 97 mg/dL (ref 70–99)
Potassium: 4.2 mmol/L (ref 3.5–5.1)
Sodium: 137 mmol/L (ref 135–145)

## 2019-07-24 LAB — CBC
HCT: 37.6 % (ref 36.0–46.0)
Hemoglobin: 11.5 g/dL — ABNORMAL LOW (ref 12.0–15.0)
MCH: 24.5 pg — ABNORMAL LOW (ref 26.0–34.0)
MCHC: 30.6 g/dL (ref 30.0–36.0)
MCV: 80.2 fL (ref 80.0–100.0)
Platelets: 252 10*3/uL (ref 150–400)
RBC: 4.69 MIL/uL (ref 3.87–5.11)
RDW: 17.2 % — ABNORMAL HIGH (ref 11.5–15.5)
WBC: 6.3 10*3/uL (ref 4.0–10.5)
nRBC: 0 % (ref 0.0–0.2)

## 2019-07-24 LAB — ABO/RH: ABO/RH(D): O POS

## 2019-07-24 NOTE — Progress Notes (Signed)
[facility] [phone] - [place], [place] - [address] [place] [address] Phone: [phone] Fax: [phone]   Your procedure is scheduled on Tuesday, June 8th.  Report to [facility] Entr[facility] "A" at 12:00 P.M., and check in at the Admitting office.  Call this number if you have problems the morning of surgery:  [phone]  Call [phone] if you have any questions prior to your surgery date Monday-Friday 8am-4pm   Remember:  Do not eat after midnight the night before your surgery  You may drink clear liquids until 11:00 A.M. the morning of your surgery.   Clear liquids allowed are: Water, Non-Citrus Juices (without pulp), Carbonated Beverages, Clear Tea, Black Coffee Only, and Gatorade    Take these medicines the morning of surgery with A SIP OF WATER: NONE  As of today, STOP taking any Aspirin (unless otherwise instructed by your surgeon) and Aspirin containing products, Aleve, Naproxen, Ibuprofen, Motrin, Advil, Goody's, BC's, all herbal medications, fish oil, and all vitamins.             Do not wear jewelry, make up, or nail polish            Do not wear lotions, powders, perfumes, or deodorant.            Do not shave 48 hours prior to surgery.              Do not bring valuables to the hospital.            [facility] is not responsible for any belongings or valuables.  Do NOT Smoke (Tobacco/Vapping) or drink Alcohol 24 hours prior to your procedure If you use a CPAP at night, you may bring all equipment for your overnight stay.   Contacts, glasses, dentures or bridgework may not be worn into surgery.      For patients admitted to the hospital, discharge time will be determined by your treatment team.   Patients discharged the day of surgery will not be allowed to drive home, and someone needs to stay with them for 24 hours.  Special instructions:   [facility]- Preparing For  Surgery  Before surgery, you can play an important role. Because skin is not sterile, your skin needs to be as free of germs as possible. You can reduce the number of germs on your skin by washing with CHG (chlorahexidine gluconate) Soap before surgery.  CHG is an antiseptic cleaner which kills germs and bonds with the skin to continue killing germs even after washing.   Oral Hygiene is also important to reduce your risk of infection.  Remember - BRUSH YOUR TEETH THE MORNING OF SURGERY WITH YOUR REGULAR TOOTHPASTE  Please do not use if you have an allergy to CHG or antibacterial soaps. If your skin becomes reddened/irritated stop using the CHG.  Do not shave (including legs and underarms) for at least 48 hours prior to first CHG shower. It is OK to shave your face.  Please follow these instructions carefully.   1. Shower the NIGHT BEFORE SURGERY and the MORNING OF SURGERY with CHG Soap.   2. If you chose to wash your hair, wash your hair first as usual with your normal shampoo.  3. After you shampoo, rinse your hair and body thoroughly to remove the shampoo.  4. Use CHG as you would any other liquid soap. You can apply CHG directly to the skin and wash gently  with a scrungie or a clean washcloth.   5. Apply the CHG Soap to your body ONLY FROM THE NECK DOWN.  Do not use on open wounds or open sores. Avoid contact with your eyes, ears, mouth and genitals (private parts). Wash Face and genitals (private parts)  with your normal soap.   6. Wash thoroughly, paying special attention to the area where your surgery will be performed.  7. Thoroughly rinse your body with warm water from the neck down.  8. DO NOT shower/wash with your normal soap after using and rinsing off the CHG Soap.  9. Pat yourself dry with a CLEAN TOWEL.  10. Wear CLEAN PAJAMAS to bed the night before surgery, wear comfortable clothes the morning of surgery  11. Place CLEAN SHEETS on your bed the night of your first shower  and DO NOT SLEEP WITH PETS.  Day of Surgery: Shower with CHG soap as instructed above.  Do not apply any deodorants/lotions.  Please wear clean clothes to the hospital/surgery center.   Remember to brush your teeth WITH YOUR REGULAR TOOTHPASTE.   Please read over the following fact sheets that you were given.

## 2019-07-24 NOTE — Progress Notes (Signed)
PCP - denies Cardiologist - denies   PPM/ICD - denies  Chest x-ray - N/A EKG - 07/24/2019 Stress Test - denies ECHO - denies Cardiac Cath - denies  Sleep Study - denies CPAP - N/A  DM: denies  Blood Thinner Instructions: N/A Aspirin Instructions: stop taking aspirin-containing products as of today  ERAS Protcol - Yes PRE-SURGERY Ensure or G2- None ordered   COVID TEST- Scheduled for 07/27/2019. Patient verbalized understanding of self-quarantine instructions, appointment time and place.  Anesthesia review: YES, abnormal  EKG  Patient denies shortness of breath, fever, cough and chest pain at PAT appointment  All instructions explained to the patient, with a verbal understanding of the material. Patient agrees to go over the instructions while at home for a better understanding. Patient also instructed to self quarantine after being tested for COVID-19. The opportunity to ask questions was provided.

## 2019-07-25 ENCOUNTER — Inpatient Hospital Stay (HOSPITAL_COMMUNITY): Payer: Managed Care, Other (non HMO) | Admitting: Vascular Surgery

## 2019-07-25 ENCOUNTER — Inpatient Hospital Stay (HOSPITAL_COMMUNITY): Payer: Managed Care, Other (non HMO) | Admitting: Anesthesiology

## 2019-07-25 NOTE — Anesthesia Preprocedure Evaluation (Signed)
Anesthesia Evaluation    Airway        Dental   Pulmonary Current Smoker,           Cardiovascular hypertension,      Neuro/Psych    GI/Hepatic   Endo/Other    Renal/GU      Musculoskeletal   Abdominal   Peds  Hematology   Anesthesia Other Findings   Reproductive/Obstetrics                             Anesthesia Physical Anesthesia Plan  ASA:   Anesthesia Plan:    Post-op Pain Management:    Induction:   PONV Risk Score and Plan:   Airway Management Planned:   Additional Equipment:   Intra-op Plan:   Post-operative Plan:   Informed Consent:   Plan Discussed with:   Anesthesia Plan Comments: (PAT note written by Justyn Boyson, PA-C. )        Anesthesia Quick Evaluation  

## 2019-07-25 NOTE — Progress Notes (Addendum)
Anesthesia Chart Review:  Case: [id number] Date/Time: 07/30/19 1345   Procedure: LAPAROTOMY WITH OVARIAN CYSTECTOMY AND POSSIBLE OOPHORECTOMY (Left )   Anesthesia type: Choice   Pre-op diagnosis: Left ovarian cystic mass   Location: [facility] OR ROOM 12 / [facility]   Surgeons: Chancy Milroy, MD      DISCUSSION: Patient is a 34 year old female scheduled for the above procedure.  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  BP elevated at 153/106 at PAT RN visit. I have left a voice message for patient to call me to see if she is able to monitor BP trends at home, but so far have not heard back from her. Her previous BP was 134/93 at her 03/27/19 visit with Dr. Rip Harbour and 135/97 on 10/30/18; however DBP > 115 on 03/21/19 but in the setting of ED visit for acute abdominal pain. Discussed with anesthesiologist Andres Shad, MD. Proceeding as scheduled could depend on BP reading on the day of surgery. She will get vitals on arrival. In the interim, I will communicate BP trends with Dr. Rip Harbour and inquire about other recommendations, if any. (UPDATE 07/26/19 9:13 AM: Dr. Rip Harbour is aware. She will get vitals and anesthesia team evaluation on the day of surgery.)   Presurgical COVID-19 test is scheduled for 07/27/2019. She will need urine pregnancy test on the day of surgery.   VS: BP (!) 153/106   Pulse 78   Temp 36.9 C (Oral)   Resp 20   Ht 5\' 3"  (1.6 m)   Wt 65.3 kg   LMP 07/24/2019   SpO2 100%   BMI 25.49 kg/m   BP Readings from Last 3 Encounters:  07/24/19 (!) 153/106  03/27/19 (!) 134/93  03/21/19 (!) 158/116  ED visit for abdominal pain and vaginal bleeding 03/21/19: BP 161/131, 158/116 10/30/18: BP 135/97   PROVIDERS: She denied having a PCP   LABS: Labs reviewed: Acceptable for surgery. (all labs  ordered are listed, but only abnormal results are displayed)  Labs Reviewed  CBC - Abnormal; Notable for the following components:      Result Value   Hemoglobin 11.5 (*)    MCH 24.5 (*)    RDW 17.2 (*)    All other components within normal limits  BASIC METABOLIC PANEL  TYPE AND SCREEN  ABO/RH     IMAGES: MRI Pelvis 01/19/19: IMPRESSION: 1. Large relatively simple appearing probably benign left adnexal cyst, which appears similar in size to prior CT the abdomen and pelvis 10/30/2018. Although this is favored to be benign, given the lack of regression of this lesion over the past 2 1/2 months and the potential risk for torsion, surgical evaluation may be warranted.   EKG: 07/24/19: Sinus rhythm with 1st degree A-V block Otherwise normal ECG   CV: She denied prior stress test, echocardiogram, and cardiac cath.   Past Medical History:  Diagnosis Date  . Abnormal Pap smear    colpo, ok since  . Asthma   . Cesarean delivery delivered 02/27/2013  . Chlamydia   . Headache(784.0)    OTC meds prn  . Hypertension   . Leg pain   . Lower back pain   . Ovarian cyst     Past Surgical History:  Procedure Laterality Date  . CESAREAN SECTION     x 2  . CESAREAN SECTION N/A  02/27/2013   Procedure: CESAREAN SECTION with tubal ligation;  Surgeon: Betsy Coder, MD;  Location: [facility];  Service: Obstetrics;  Laterality: N/A;  . DIAGNOSTIC LAPAROSCOPY  2007  . DILATION AND EVACUATION  10/27/2011   Procedure: DILATATION AND EVACUATION;  Surgeon: Betsy Coder, MD;  Location: [facility];  Service: Gynecology;  Laterality: N/A;  . DILATION AND EVACUATION  12/04/2011   Procedure: DILATATION AND EVACUATION;  Surgeon: Betsy Coder, MD;  Location: [facility];  Service: Gynecology;  Laterality: N/A;  . oophrectomy  2007   Right , d/t cyst    MEDICATIONS: . aspirin-acetaminophen-caffeine (EXCEDRIN MIGRAINE) [id number] MG tablet  . Aspirin-Acetaminophen-Caffeine (GOODY HEADACHE PO)  .  doxycycline (VIBRAMYCIN) 100 MG capsule  . metroNIDAZOLE (FLAGYL) 500 MG tablet   No current facility-administered medications for this encounter.  Not currently on doxycycline of Flagyl.   Myra Gianotti, PA-C Surgical Short Stay/Anesthesiology [facility] Phone [phone] [facility] Phone [phone] 07/25/2019 5:21 PM

## 2019-07-27 ENCOUNTER — Other Ambulatory Visit (HOSPITAL_COMMUNITY)
Admission: RE | Admit: 2019-07-27 | Discharge: 2019-07-27 | Disposition: A | Discharge status: Home / Self Care | Payer: Managed Care, Other (non HMO) | Source: Ambulatory Visit | Attending: Obstetrics and Gynecology | Admitting: Obstetrics and Gynecology

## 2019-07-27 DIAGNOSIS — Z01812 Encounter for preprocedural laboratory examination: Secondary | ICD-10-CM | POA: Diagnosis present

## 2019-07-27 DIAGNOSIS — Z20822 Contact with and (suspected) exposure to covid-19: Secondary | ICD-10-CM | POA: Insufficient documentation

## 2019-07-27 LAB — SARS CORONAVIRUS 2 (TAT 6-24 HRS): SARS Coronavirus 2: NEGATIVE

## 2019-07-29 MED ORDER — SODIUM CHLORIDE 0.9 % IV SOLN
2.0000 g | INTRAVENOUS | Status: DC
  Filled 2019-07-29: qty 2

## 2019-07-30 ENCOUNTER — Other Ambulatory Visit: Payer: Self-pay

## 2019-07-30 ENCOUNTER — Emergency Department (HOSPITAL_COMMUNITY)
Admission: EM | Admit: 2019-07-30 | Discharge: 2019-07-30 | Disposition: A | Discharge status: Home / Self Care | Payer: Managed Care, Other (non HMO) | Attending: Emergency Medicine | Admitting: Emergency Medicine

## 2019-07-30 ENCOUNTER — Encounter (HOSPITAL_COMMUNITY): Payer: Self-pay | Admitting: Obstetrics and Gynecology

## 2019-07-30 ENCOUNTER — Ambulatory Visit (HOSPITAL_COMMUNITY)
Admission: RE | Admit: 2019-07-30 | Discharge: 2019-07-30 | Disposition: A | Discharge status: Still In Hospital | Payer: Managed Care, Other (non HMO) | Source: Home / Self Care | Attending: Obstetrics and Gynecology | Admitting: Obstetrics and Gynecology

## 2019-07-30 ENCOUNTER — Encounter (HOSPITAL_COMMUNITY)
Admission: RE | Disposition: A | Discharge status: Still In Hospital | Payer: Self-pay | Source: Home / Self Care | Attending: Obstetrics and Gynecology

## 2019-07-30 ENCOUNTER — Encounter (HOSPITAL_COMMUNITY): Payer: Self-pay

## 2019-07-30 DIAGNOSIS — F17290 Nicotine dependence, other tobacco product, uncomplicated: Secondary | ICD-10-CM | POA: Diagnosis not present

## 2019-07-30 DIAGNOSIS — N83209 Unspecified ovarian cyst, unspecified side: Secondary | ICD-10-CM | POA: Insufficient documentation

## 2019-07-30 DIAGNOSIS — I10 Essential (primary) hypertension: Secondary | ICD-10-CM | POA: Insufficient documentation

## 2019-07-30 DIAGNOSIS — R030 Elevated blood-pressure reading, without diagnosis of hypertension: Secondary | ICD-10-CM

## 2019-07-30 DIAGNOSIS — R10814 Left lower quadrant abdominal tenderness: Secondary | ICD-10-CM | POA: Diagnosis not present

## 2019-07-30 DIAGNOSIS — Z538 Procedure and treatment not carried out for other reasons: Secondary | ICD-10-CM | POA: Insufficient documentation

## 2019-07-30 SURGERY — LAPAROTOMY
Anesthesia: Choice | Laterality: Left

## 2019-07-30 MED ORDER — PROPOFOL 10 MG/ML IV BOLUS
INTRAVENOUS | Status: AC
  Filled 2019-07-30: qty 20

## 2019-07-30 MED ORDER — LACTATED RINGERS IV SOLN: INTRAVENOUS | Status: DC | PRN

## 2019-07-30 MED ORDER — MIDAZOLAM HCL 2 MG/2ML IJ SOLN
INTRAMUSCULAR | Status: AC
  Filled 2019-07-30: qty 2

## 2019-07-30 MED ORDER — FENTANYL CITRATE (PF) 250 MCG/5ML IJ SOLN
INTRAMUSCULAR | Status: AC
  Filled 2019-07-30: qty 5

## 2019-07-30 MED ORDER — BUPIVACAINE HCL (PF) 0.5 % IJ SOLN
INTRAMUSCULAR | Status: AC
  Filled 2019-07-30: qty 30

## 2019-07-30 MED ORDER — HYDROCHLOROTHIAZIDE 25 MG PO TABS
25.0000 mg | ORAL_TABLET | Freq: Every day | ORAL | 0 refills | Status: DC
Start: 2019-07-30 — End: 2019-08-21

## 2019-07-30 MED ORDER — ROCURONIUM BROMIDE 10 MG/ML (PF) SYRINGE
PREFILLED_SYRINGE | INTRAVENOUS | Status: AC
  Filled 2019-07-30: qty 10

## 2019-07-30 MED ORDER — ONDANSETRON HCL 4 MG/2ML IJ SOLN
INTRAMUSCULAR | Status: AC
  Filled 2019-07-30: qty 2

## 2019-07-30 MED ORDER — LIDOCAINE 2% (20 MG/ML) 5 ML SYRINGE
INTRAMUSCULAR | Status: AC
  Filled 2019-07-30: qty 5

## 2019-07-30 MED ORDER — DEXAMETHASONE SODIUM PHOSPHATE 10 MG/ML IJ SOLN
INTRAMUSCULAR | Status: AC
  Filled 2019-07-30: qty 1

## 2019-07-30 NOTE — Progress Notes (Signed)
Pt. Arrived to Short Stay. Blood pressure elevated. Dr. Jillyn Hidden and Dr. Rip Harbour notified. Dr. Rip Harbour in to see patient. Stated he is cancelling surgery. Stated to send patient to E.D. to be evaluate. He also stated he would arrange pt. To see a doctor about the blood pressure. Pt. Aware and is willing to go to the ED. Pt. Transported via wheelchair.

## 2019-07-30 NOTE — ED Triage Notes (Signed)
Pt sent from short stay due to htn, pt scheduled to get a cyst removed from her ovary but they cancelled surgery today due to BP 170/109. Pt tearful in triage, c.o abd pain

## 2019-07-30 NOTE — Discharge Instructions (Signed)
As discussed, I am sending you home with a blood pressure medication.  Take once daily. I recommend taking a daily multivitamin with the blood pressure medication. I have included the number for [facility].  Please call tomorrow to schedule an appointment to establish care.  Continue to monitor your blood pressure.  You can order a blood pressure monitor online or check it at any pharmacy.  Return to the ER for new or worsening symptoms.

## 2019-07-30 NOTE — ED Provider Notes (Signed)
[facility] EMERGENCY DEPARTMENT Provider Note   CSN: [id number] Arrival date & time: 07/30/19  1324     History Chief Complaint  Patient presents with  . Hypertension    [name] is a 34 y.o. female with a past medical history significant for asthma and hypertension not currently on any medications who presents to the ED due to elevated blood pressure.  Patient was scheduled for an ovarian cyst removal today which was canceled due to an elevated blood pressure of 170/109.  Patient notes she has been diagnosed with hypertension and has never been on any medications.  She admits to stopping tobacco use 2 years ago which decreased her blood pressure; ever, she started smoking again in September and has noticed elevated BP since.  Patient denies headache, chest pain, changes to vision, shortness of breath, and urinary symptoms. She admits to chronic left-sided abdominal pain that is consistent with her normal ovarian pain. She does not currently have a PCP she follows. No physical complaints at this time except chronic abdominal pain.  Denies speech changes, unilateral weakness, dizziness, and facial droop.  History obtained from patient and past medical records. No interpreter used during encounter.      Past Medical History:  Diagnosis Date  . Abnormal Pap smear    colpo, ok since  . Asthma   . Cesarean delivery delivered 02/27/2013  . Chlamydia   . Headache(784.0)    OTC meds prn  . Hypertension   . Leg pain   . Lower back pain   . Ovarian cyst     Patient Active Problem List   Diagnosis Date Noted  . Ovarian mass, left 03/27/2019  . Hx of ovarian cyst 07/21/2012  . Previous cesarean delivery, antepartum condition or complication x 2 10/62/6948  . Smoker 07/21/2012    Past Surgical History:  Procedure Laterality Date  . CESAREAN SECTION     x 2  . CESAREAN SECTION N/A 02/27/2013   Procedure: CESAREAN SECTION with tubal ligation;  Surgeon: Betsy Coder, MD;  Location: [facility];  Service: Obstetrics;  Laterality: N/A;  . DIAGNOSTIC LAPAROSCOPY  2007  . DILATION AND EVACUATION  10/27/2011   Procedure: DILATATION AND EVACUATION;  Surgeon: Betsy Coder, MD;  Location: [facility];  Service: Gynecology;  Laterality: N/A;  . DILATION AND EVACUATION  12/04/2011   Procedure: DILATATION AND EVACUATION;  Surgeon: Betsy Coder, MD;  Location: [facility];  Service: Gynecology;  Laterality: N/A;  . oophrectomy  2007   Right , d/t cyst     OB History    Gravida  5   Para  4   Term  3   Preterm  1   AB  1   Living  4     SAB  1   TAB  0   Ectopic  0   Multiple  0   Live Births  4           Family History  Problem Relation Age of Onset  . Hypertension Mother   . Hypertension Father   . Diabetes Paternal Aunt   . Hypertension Maternal Grandmother   . Cancer Maternal Grandmother   . Hypertension Maternal Grandfather   . Hypertension Paternal Grandmother   . Hypertension Paternal Grandfather   . Mental retardation Maternal Uncle   . Other Neg Hx     Social History   Tobacco Use  . Smoking status: Current Every Day Smoker  Packs/day: 1.00    Years: 4.00    Pack years: 4.00    Last attempt to quit: 07/21/2011    Years since quitting: 8.0  . Smokeless tobacco: Never Used  . Tobacco comment: about 1-2 black and mild   Substance Use Topics  . Alcohol use: Yes    Comment: rarely  . Drug use: No    Home Medications Prior to Admission medications   Medication Sig Start Date End Date Taking? Authorizing Provider  aspirin-acetaminophen-caffeine (EXCEDRIN MIGRAINE) [phone] MG tablet Take 2 tablets by mouth every 6 (six) hours as needed for headache.    [provider]  Aspirin-Acetaminophen-Caffeine (GOODY HEADACHE PO) Take 1 packet by mouth daily as needed (headache/pain).    [provider]  doxycycline (VIBRAMYCIN) 100 MG capsule Take 1 capsule (100 mg total) by mouth 2 (two) times daily. One  po bid x 7 days Patient not taking: Reported on 07/12/2019 03/21/19   Daleen Bo, MD  hydrochlorothiazide (HYDRODIURIL) 25 MG tablet Take 1 tablet (25 mg total) by mouth daily. 07/30/19   Suzy Bouchard, PA-C  metroNIDAZOLE (FLAGYL) 500 MG tablet Take 1 tablet (500 mg total) by mouth 2 (two) times daily. One po bid x 7 days Patient not taking: Reported on 07/12/2019 03/21/19   Daleen Bo, MD    Allergies    Patient has no known allergies.  Review of Systems   Review of Systems  Constitutional: Negative for chills and fever.  Eyes: Negative for visual disturbance.  Respiratory: Negative for shortness of breath.   Cardiovascular: Negative for chest pain and leg swelling.  Gastrointestinal: Positive for abdominal pain (chronic from ovarian cyst). Negative for diarrhea, nausea and vomiting.  Genitourinary: Negative for difficulty urinating.  Neurological: Negative for dizziness, speech difficulty, weakness and headaches.    Physical Exam Updated Vital Signs BP (!) 151/94 (BP Location: Left Arm)   Pulse 77   Temp 98.3 F (36.8 C) (Oral)   Resp 14   Ht 5\' 3"  (1.6 m)   Wt 63.5 kg   LMP 07/24/2019   SpO2 100%   BMI 24.80 kg/m   Physical Exam Vitals and nursing note reviewed.  Constitutional:      General: She is not in acute distress.    Appearance: She is not ill-appearing.  HENT:     Head: Normocephalic.  Eyes:     Pupils: Pupils are equal, round, and reactive to light.  Cardiovascular:     Rate and Rhythm: Normal rate and regular rhythm.     Pulses: Normal pulses.     Heart sounds: Normal heart sounds. No murmur. No friction rub. No gallop.   Pulmonary:     Effort: Pulmonary effort is normal.     Breath sounds: Normal breath sounds.  Abdominal:     General: Abdomen is flat. There is no distension.     Palpations: Abdomen is soft.     Tenderness: There is abdominal tenderness. There is no guarding or rebound.     Comments: TTP in LLQ. No rebound or guarding.     Musculoskeletal:     Cervical back: Neck supple.     Comments: Able to move all 4 extremities without difficulty. No lower extremity edema.   Skin:    General: Skin is warm and dry.  Neurological:     General: No focal deficit present.     Mental Status: She is alert.     Comments: Cranial nerves grossly intact. Equal grip strength. No facial  droop. Patient able to ambulate in the ED without difficulty.   Psychiatric:        Mood and Affect: Mood normal.        Behavior: Behavior normal.     ED Results / Procedures / Treatments   Labs (all labs ordered are listed, but only abnormal results are displayed) Labs Reviewed - No data to display  EKG None  Radiology No results found.  Procedures Procedures (including critical care time)  Medications Ordered in ED Medications - No data to display  ED Course  I have reviewed the triage vital signs and the nursing notes.  Pertinent labs & imaging results that were available during my care of the patient were reviewed by me and considered in my medical decision making (see chart for details).  Clinical Course as of Jul 29 1733  Tue Jul 30, 2019  1733 BP(!): 151/94 [CA]    Clinical Course User Index [CA] Karie Kirks   MDM Rules/Calculators/A&P                     34 year old female presents to the ED from surgery due to elevated BP of 170/109.  Patient has a history of hypertension is not currently on any medications. She does not have a PCP. She is currently asymptomatic. Upon arrival vitals all normal except elevated BP at 170/109. Patient in no acute distress and non-ill appearing. Physical exam reassuring. Normal neurological exam. No concern for hypertensive urgency/emergency. Will discharge patient to HCTZ 25 mg. [facility] number given to patient at discharge. Strict ED precautions discussed with patient. Patient states understanding and agrees to plan. Patient discharged home in no acute distress and  stable vitals  Final Clinical Impression(s) / ED Diagnoses Final diagnoses:  Elevated blood pressure reading    Rx / DC Orders ED Discharge Orders         Ordered    hydrochlorothiazide (HYDRODIURIL) 25 MG tablet  Daily     07/30/19 1731           Karie Kirks 07/30/19 1832    Drenda Freeze, MD 07/30/19 2312

## 2019-07-30 NOTE — Progress Notes (Signed)
Dr. Jillyn Hidden aware of patient's blood pressure, and per Dr. Jillyn Hidden we can continue with case today.

## 2019-07-30 NOTE — Progress Notes (Signed)
Elevated BP reviewed with pt. H/O HTN in the past. BP elevated at pre admission testing as well. Since case is non emergent, will cancel surgery. Nursing made aware Discussed with pt and she verbalized understanding. Nursing will take pt to ED for further evaluation. Pt does not have a PCP, we will assist her in obtaining a PCP. We will schedule a follow up appt in the office once BP is under control.

## 2019-07-31 ENCOUNTER — Other Ambulatory Visit: Payer: Self-pay | Admitting: Obstetrics and Gynecology

## 2019-07-31 DIAGNOSIS — N838 Other noninflammatory disorders of ovary, fallopian tube and broad ligament: Secondary | ICD-10-CM

## 2019-08-01 ENCOUNTER — Telehealth: Payer: Self-pay | Admitting: Family Medicine

## 2019-08-01 NOTE — Telephone Encounter (Signed)
Received call from patient needing a letter to be able to go back to work. Her surgery was canceled due to her blood pressure being high.

## 2019-08-01 NOTE — Telephone Encounter (Signed)
Letter completed.  Pt notified.   Mel Almond, RN

## 2019-08-07 ENCOUNTER — Ambulatory Visit (HOSPITAL_COMMUNITY)
Admission: RE | Admit: 2019-08-07 | Discharge: 2019-08-07 | Disposition: A | Discharge status: Home / Self Care | Payer: Managed Care, Other (non HMO) | Source: Ambulatory Visit | Attending: Obstetrics and Gynecology | Admitting: Obstetrics and Gynecology

## 2019-08-07 ENCOUNTER — Other Ambulatory Visit: Payer: Self-pay

## 2019-08-07 DIAGNOSIS — N838 Other noninflammatory disorders of ovary, fallopian tube and broad ligament: Secondary | ICD-10-CM | POA: Insufficient documentation

## 2019-08-07 NOTE — Telephone Encounter (Signed)
Error

## 2019-08-21 ENCOUNTER — Ambulatory Visit: Payer: Managed Care, Other (non HMO) | Attending: Internal Medicine | Admitting: Internal Medicine

## 2019-08-21 ENCOUNTER — Other Ambulatory Visit: Payer: Self-pay

## 2019-08-21 ENCOUNTER — Encounter: Payer: Self-pay | Admitting: Internal Medicine

## 2019-08-21 DIAGNOSIS — I10 Essential (primary) hypertension: Secondary | ICD-10-CM

## 2019-08-21 MED ORDER — HYDROCHLOROTHIAZIDE 25 MG PO TABS
25.0000 mg | ORAL_TABLET | Freq: Every day | ORAL | 3 refills | Status: DC
Start: 2019-08-21 — End: 2020-01-22

## 2019-08-21 NOTE — Assessment & Plan Note (Signed)
Reviewed bps bp is great today. Continue current meds. No further evaluation necessary at this time.   She did not have pregnancy related htn with 5 pregnancies, 4 live births.

## 2019-08-21 NOTE — Progress Notes (Signed)
[facility]- 07/30/2019  LAPAROTOMY WITH OVARIAN CYSTECTOMY AND POSSIBLE OOPHORECTOMY      Elevated BP reading pre op and was encouraged to f/u with PCP. Laparotomy  was canceled.   Has taken BP medications daily, stopped smoking, and modified diet.

## 2019-08-21 NOTE — Progress Notes (Signed)
Pt comes in for f/u htn   BP Readings from Last 3 Encounters:  08/21/19 118/75  07/30/19 (!) 151/94  07/30/19 (!) 161/116   She feels well and bp is great today She has a fhx of htn ("both sides")  Past Medical History:  Diagnosis Date  . Abnormal Pap smear    colpo, ok since  . Asthma   . Cesarean delivery delivered 02/27/2013  . Chlamydia   . Headache(784.0)    OTC meds prn  . Hypertension   . Leg pain   . Lower back pain   . Ovarian cyst     Social History   Socioeconomic History  . Marital status: Single    Spouse name: Not on file  . Number of children: Not on file  . Years of education: Not on file  . Highest education level: Not on file  Occupational History  . Not on file  Tobacco Use  . Smoking status: Former Smoker    Packs/day: 1.00    Years: 4.00    Pack years: 4.00    Quit date: 07/27/2011    Years since quitting: 8.0  . Smokeless tobacco: Never Used  . Tobacco comment: about 1-2 black and mild   Vaping Use  . Vaping Use: Never used  Substance and Sexual Activity  . Alcohol use: Yes    Alcohol/week: 2.0 standard drinks    Types: 2 Glasses of wine per week    Comment: rarely  . Drug use: No  . Sexual activity: Yes    Birth control/protection: None  Other Topics Concern  . Not on file  Social History Narrative  . Not on file   Social Determinants of Health   Financial Resource Strain:   . Difficulty of Paying Living Expenses:   Food Insecurity:   . Worried About Charity fundraiser in the Last Year:   . Arboriculturist in the Last Year:   Transportation Needs:   . Film/video editor (Medical):   Marland Kitchen Lack of Transportation (Non-Medical):   Physical Activity:   . Days of Exercise per Week:   . Minutes of Exercise per Session:   Stress:   . Feeling of Stress :   Social Connections:   . Frequency of Communication with Friends and Family:   . Frequency of Social Gatherings with Friends and Family:   . Attends Religious Services:   .  Active Member of Clubs or Organizations:   . Attends Archivist Meetings:   Marland Kitchen Marital Status:   Intimate Partner Violence:   . Fear of Current or Ex-Partner:   . Emotionally Abused:   Marland Kitchen Physically Abused:   . Sexually Abused:     Past Surgical History:  Procedure Laterality Date  . CESAREAN SECTION     x 2  . CESAREAN SECTION N/A 02/27/2013   Procedure: CESAREAN SECTION with tubal ligation;  Surgeon: Betsy Coder, MD;  Location: [facility];  Service: Obstetrics;  Laterality: N/A;  . DIAGNOSTIC LAPAROSCOPY  2007  . DILATION AND EVACUATION  10/27/2011   Procedure: DILATATION AND EVACUATION;  Surgeon: Betsy Coder, MD;  Location: [facility];  Service: Gynecology;  Laterality: N/A;  . DILATION AND EVACUATION  12/04/2011   Procedure: DILATATION AND EVACUATION;  Surgeon: Betsy Coder, MD;  Location: [facility];  Service: Gynecology;  Laterality: N/A;  . oophrectomy  2007   Right , d/t cyst    Family History  Problem Relation Age of  Onset  . Hypertension Mother   . Hypertension Father   . Diabetes Paternal Aunt   . Hypertension Maternal Grandmother   . Cancer Maternal Grandmother   . Hypertension Maternal Grandfather   . Hypertension Paternal Grandmother   . Hypertension Paternal Grandfather   . Mental retardation Maternal Uncle   . Other Neg Hx     No Known Allergies  Current Outpatient Medications on File Prior to Visit  Medication Sig Dispense Refill  . aspirin-acetaminophen-caffeine (EXCEDRIN MIGRAINE) 250-250-65 MG tablet Take 2 tablets by mouth every 6 (six) hours as needed for headache.    . Aspirin-Acetaminophen-Caffeine (GOODY HEADACHE PO) Take 1 packet by mouth daily as needed (headache/pain).    . hydrochlorothiazide (HYDRODIURIL) 25 MG tablet Take 1 tablet (25 mg total) by mouth daily. 30 tablet 0   Current Facility-Administered Medications on File Prior to Visit  Medication Dose Route Frequency Provider Last Rate Last Admin  . lactated ringers infusion    Intravenous Continuous PRN Michele Rockers, CRNA   New Bag at 07/30/19 1302     patient denies chest pain, shortness of breath, orthopnea. Denies lower extremity edema, abdominal pain, change in appetite, change in bowel movements. Patient denies rashes, musculoskeletal complaints. No other specific complaints in a complete review of systems.   BP 118/75 (BP Location: Left Arm, Patient Position: Sitting, Cuff Size: Normal)   Pulse 83   Temp 97.7 F (36.5 C)   Resp 16   Ht 5\' 3"  (1.6 m)   Wt 144 lb (65.3 kg)   LMP 07/24/2019   SpO2 99%   BMI 25.51 kg/m   Well-developed well-nourished female in no acute distress. HEENT exam atraumatic, normocephalic, extraocular muscles are intact. Neck is supple. No jugular venous distention no thyromegaly. Chest clear to auscultation without increased work of breathing. Cardiac exam S1 and S2 are regular. Abdominal exam active bowel sounds, soft, nontender. Extremities no edema. Neurologic exam she is alert without any motor sensory deficits. Gait is normal.  Hypertension Reviewed bps bp is great today. Continue current meds. No further evaluation necessary at this time.   She did not have pregnancy related htn with 5 pregnancies, 4 live births.   Refilled Rx

## 2019-08-22 DIAGNOSIS — Z419 Encounter for procedure for purposes other than remedying health state, unspecified: Secondary | ICD-10-CM | POA: Diagnosis not present

## 2019-08-23 ENCOUNTER — Ambulatory Visit: Payer: Managed Care, Other (non HMO) | Admitting: Family Medicine

## 2019-09-09 ENCOUNTER — Telehealth: Payer: Self-pay | Admitting: Obstetrics and Gynecology

## 2019-09-09 NOTE — Telephone Encounter (Signed)
Attempted to reach the patient about her appointment getting rescheduled. Left a voicemail message.

## 2019-09-13 ENCOUNTER — Ambulatory Visit: Payer: Managed Care, Other (non HMO) | Admitting: Obstetrics and Gynecology

## 2019-09-18 ENCOUNTER — Encounter: Payer: Self-pay | Admitting: Obstetrics and Gynecology

## 2019-09-18 ENCOUNTER — Other Ambulatory Visit: Payer: Self-pay

## 2019-09-18 ENCOUNTER — Ambulatory Visit (INDEPENDENT_AMBULATORY_CARE_PROVIDER_SITE_OTHER): Payer: Managed Care, Other (non HMO) | Admitting: Obstetrics and Gynecology

## 2019-09-18 VITALS — BP 123/88 | HR 78 | Wt 148.6 lb

## 2019-09-18 DIAGNOSIS — Z90721 Acquired absence of ovaries, unilateral: Secondary | ICD-10-CM

## 2019-09-18 DIAGNOSIS — N83209 Unspecified ovarian cyst, unspecified side: Secondary | ICD-10-CM | POA: Diagnosis not present

## 2019-09-18 DIAGNOSIS — I10 Essential (primary) hypertension: Secondary | ICD-10-CM | POA: Diagnosis not present

## 2019-09-18 DIAGNOSIS — N838 Other noninflammatory disorders of ovary, fallopian tube and broad ligament: Secondary | ICD-10-CM

## 2019-09-18 NOTE — Progress Notes (Signed)
Pt declines Day due to having an appt scheduled with a therapist coming up.    Mel Almond, RN

## 2019-09-18 NOTE — Progress Notes (Signed)
Ms Heinle presents in follow up from prior canceled surgery d/t HTN BP is now under control F/U U/S confirms presence of ovarian cyst 9 cm x 10 cm H/O of right oophorectomy d/t dermoid   PE AF VSS Lungs clear Heart RRR Abd soft + BS   A/P Ovarian cyst        HTN, controlled  Will reschedule Exp Lpa with ovarian cystectomy. R/B/Post op care reviewed with pt. F/U with Post op appt.

## 2019-09-18 NOTE — Patient Instructions (Signed)
Exploratory Laparotomy, Adult, Care After °This sheet gives you information about how to care for yourself after your procedure. Your health care provider may also give you more specific instructions. If you have problems or questions, contact your health care provider. °What can I expect after the procedure? °After the procedure, it is common to have: °· Abdominal soreness. °· Fatigue. °· A sore throat from the tube in your throat. °· A lack of appetite. °Follow these instructions at home: °Medicines °· Take over-the-counter and prescription medicines only as told by your health care provider. °· If you were prescribed an antibiotic medicine, take it as told by your health care provider. Do not stop taking the antibiotic even if you start to feel better. °· Do not drive or operate heavy machinery while taking pain medicine. °· If you are taking prescription pain medicine, take actions to prevent or treat constipation. Your health care provider may recommend that you: °? Drink enough fluid to keep your urine pale yellow. °? Eat foods that are high in fiber, such as fresh fruits and vegetables, whole grains, and beans. °? Limit foods that are high in fat and processed sugars, such as fried or sweet foods. °? Take an over-the-counter or prescription medicine for constipation. Undergoing surgery and taking pain medicines can make constipation worse. °Incision care ° °· Follow instructions from your health care provider about how to take care of your incision. Make sure you: °? Wash your hands with soap and water before you change your bandage (dressing). If soap and water are not available, use hand sanitizer. °? Change your dressing as told by your health care provider. °? Leave stitches (sutures), skin glue, or adhesive strips in place. These skin closures may need to stay in place for 2 weeks or longer. If adhesive strip edges start to loosen and curl up, you may trim the loose edges. Do not remove adhesive strips  completely unless your health care provider tells you to do that. °· If you were sent home with a drain, follow instructions from your health care provider about how to care for it. °· Check your incision area every day for signs of infection. Check for: °? Redness, swelling, or pain. °? Fluid or blood. °? Warmth. °? Pus or a bad smell. °Activity ° °· Rest as told by your health care provider. °? Avoid sitting for a long time without moving. Get up to take short walks every 1-2 hours. This is important to improve blood flow and breathing. Ask for help if you feel weak or unsteady. °· Do not lift anything that is heavier than 5 lb (2.2 kg), or the limit that your health care provider tells you, until he or she says that it is safe. °· Ask your health care provider when you can start to do your usual activities again, such as driving, going back to work, and having sex. °Eating and drinking °· You may eat what you usually eat. Include lots of whole grains, fruits, and vegetables in your diet. This will help to prevent constipation. °· Drink enough fluid to keep your urine pale yellow. °Bathing °· Keep your incision clean and dry. Clean it as often as told by your health care provider: °? Gently wash the incision with soap and water. °? Rinse the incision with water to remove all soap. °? Pat the incision dry with a clean towel. Do not rub the incision. °· You may take showers after 48 hours. °· Do not take baths,   swim, or use a hot tub until your health care provider says it is okay to do so. °General instructions °· Do not use any products that contain nicotine or tobacco, such as cigarettes and e-cigarettes. These can delay healing after surgery. If you need help quitting, ask your health care provider. °· Wear compression stockings as told by your health care provider. These stockings help to prevent blood clots and reduce swelling in your legs. °· Keep all follow-up visits as told by your health care provider.  This is important. °Contact a health care provider if: °· You have a fever. °· You have chills. °· Your pain medicine is not helping. °· You have constipation or diarrhea. °· You have nausea or vomiting. °· You have drainage, redness, swelling, or pain at your incision site. °Get help right away if: °· Your pain is getting worse. °· You have not had a bowel movement for more than 3 days. °· You have ongoing (persistent) vomiting. °· The edges of your incision open up. °· You have warmth, tenderness, and swelling in your calf. °· You have trouble breathing. °· You have chest pain. °These symptoms may represent a serious problem that is an emergency. Do not wait to see if the symptoms will go away. Get medical help right away. Call your local emergency services (911 in the [place]). Do not drive yourself to the hospital. °Summary °· Abdominal soreness is common after exploratory laparotomy. Take over-the-counter and prescription pain medicines only as told by your health care provider. °· Follow instructions from your health care provider about how to take care of your incision. Do not take baths, swim, or use a hot tub until your health care provider says it is okay to do so. °· Watch for signs and symptoms of infection after surgery, including fever, chills, drainage from your incision, and worsening abdominal pain. °This information is not intended to replace advice given to you by your health care provider. Make sure you discuss any questions you have with your health care provider. °Document Revised: 04/02/2018 Document Reviewed: 02/17/2017 °Elsevier Patient Education © 2020 [employer] Inc[employer] ° °

## 2019-09-22 DIAGNOSIS — Z419 Encounter for procedure for purposes other than remedying health state, unspecified: Secondary | ICD-10-CM | POA: Diagnosis not present

## 2019-09-27 DIAGNOSIS — F411 Generalized anxiety disorder: Secondary | ICD-10-CM | POA: Diagnosis not present

## 2019-10-11 DIAGNOSIS — F411 Generalized anxiety disorder: Secondary | ICD-10-CM | POA: Diagnosis not present

## 2019-10-23 DIAGNOSIS — Z419 Encounter for procedure for purposes other than remedying health state, unspecified: Secondary | ICD-10-CM | POA: Diagnosis not present

## 2019-10-25 DIAGNOSIS — F411 Generalized anxiety disorder: Secondary | ICD-10-CM | POA: Diagnosis not present

## 2019-11-18 DIAGNOSIS — F411 Generalized anxiety disorder: Secondary | ICD-10-CM | POA: Diagnosis not present

## 2019-11-22 DIAGNOSIS — Z419 Encounter for procedure for purposes other than remedying health state, unspecified: Secondary | ICD-10-CM | POA: Diagnosis not present

## 2019-11-27 ENCOUNTER — Other Ambulatory Visit: Payer: Self-pay

## 2019-11-27 ENCOUNTER — Encounter: Payer: Self-pay | Admitting: Nurse Practitioner

## 2019-11-27 ENCOUNTER — Ambulatory Visit: Payer: Managed Care, Other (non HMO) | Attending: Nurse Practitioner | Admitting: Nurse Practitioner

## 2019-11-27 VITALS — Ht 63.0 in | Wt 135.0 lb

## 2019-11-27 DIAGNOSIS — I10 Essential (primary) hypertension: Secondary | ICD-10-CM

## 2019-11-27 DIAGNOSIS — Z7689 Persons encountering health services in other specified circumstances: Secondary | ICD-10-CM

## 2019-11-27 NOTE — Progress Notes (Signed)
Virtual Visit via Telephone Note Due to national recommendations of social distancing due to [name], telehealth visit is felt to be most appropriate for this patient at this time.  I discussed the limitations, risks, security and privacy concerns of performing an evaluation and management service by telephone and the availability of in person appointments. I also discussed with the patient that there may be a patient responsible charge related to this service. The patient expressed understanding and agreed to proceed.    I connected with [name] on 11/27/19  at   9:10 AM EDT  EDT by telephone and verified that I am speaking with the correct person using two identifiers.   Consent I discussed the limitations, risks, security and privacy concerns of performing an evaluation and management service by telephone and the availability of in person appointments. I also discussed with the patient that there may be a patient responsible charge related to this service. The patient expressed understanding and agreed to proceed.   Location of Patient: Private Residence    Location of Provider: [place] [facility] [place] participating in Telemedicine visit: [name] FNP-BC [place] [facility]    History of Present Illness: Telemedicine visit for: Establish care She has laparoscopic surgery next month to remove left ovarian cyst. 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  Essential Hypertension  She does monitor her blood pressure at home but not daily. Reports normal readings. Denies chest pain, shortness of breath, palpitations, lightheadedness, dizziness, headaches or BLE edema. Taking HCTZ 25mg  daily as prescribed.  BP Readings from Last 3 Encounters:  09/18/19 (!) 123/88  08/21/19 118/75  07/30/19 (!) 151/94      Past Medical History:  Diagnosis Date  . Abnormal Pap smear    colpo, ok since  . Cesarean delivery  delivered 02/27/2013  . Chlamydia   . Headache(784.0)    OTC meds prn  . Hypertension   . Leg pain   . Lower back pain   . Ovarian cyst     Past Surgical History:  Procedure Laterality Date  . CESAREAN SECTION    . CESAREAN SECTION N/A 02/27/2013   Procedure: CESAREAN SECTION with tubal ligation;  Surgeon: Betsy Coder, MD;  Location: [facility];  Service: Obstetrics;  Laterality: N/A;  . CESAREAN SECTION    . DIAGNOSTIC LAPAROSCOPY  2007  . DILATION AND EVACUATION  10/27/2011   Procedure: DILATATION AND EVACUATION;  Surgeon: Betsy Coder, MD;  Location: [facility];  Service: Gynecology;  Laterality: N/A;  . DILATION AND EVACUATION  12/04/2011   Procedure: DILATATION AND EVACUATION;  Surgeon: Betsy Coder, MD;  Location: [facility];  Service: Gynecology;  Laterality: N/A;  . oophrectomy  2007   Right , d/t cyst  . TUBAL LIGATION      Family History  Problem Relation Age of Onset  . Hypertension Mother   . Hypertension Father   . Diabetes Paternal Aunt   . Hypertension Maternal Grandmother   . Cancer Maternal Grandmother   . Hypertension Maternal Grandfather   . Hypertension Paternal Grandmother   . Hypertension Paternal Grandfather   . Mental retardation Maternal Uncle   . Other Neg Hx     Social History   Socioeconomic History  . Marital status: Single    Spouse name: Not on file  . Number of children: Not on file  . Years of education: Not on file  . Highest  education level: Not on file  Occupational History  . Not on file  Tobacco Use  . Smoking status: Former Smoker    Packs/day: 1.00    Years: 4.00    Pack years: 4.00    Quit date: 07/27/2011    Years since quitting: 8.3  . Smokeless tobacco: Never Used  . Tobacco comment: about 1-2 black and mild   Vaping Use  . Vaping Use: Never used  Substance and Sexual Activity  . Alcohol use: Yes    Alcohol/week: 2.0 standard drinks    Types: 2 Glasses of wine per week    Comment: rarely  . Drug use: No  . Sexual  activity: Yes    Birth control/protection: Surgical  Other Topics Concern  . Not on file  Social History Narrative  . Not on file   Social Determinants of Health   Financial Resource Strain:   . Difficulty of Paying Living Expenses: Not on file  Food Insecurity:   . Worried About Charity fundraiser in the Last Year: Not on file  . Ran Out of Food in the Last Year: Not on file  Transportation Needs:   . Lack of Transportation (Medical): Not on file  . Lack of Transportation (Non-Medical): Not on file  Physical Activity:   . Days of Exercise per Week: Not on file  . Minutes of Exercise per Session: Not on file  Stress:   . Feeling of Stress : Not on file  Social Connections:   . Frequency of Communication with Friends and Family: Not on file  . Frequency of Social Gatherings with Friends and Family: Not on file  . Attends Religious Services: Not on file  . Active Member of Clubs or Organizations: Not on file  . Attends Archivist Meetings: Not on file  . Marital Status: Not on file     Observations/Objective: Awake, alert and oriented x 3   Review of Systems  Constitutional: Negative for fever, malaise/fatigue and weight loss.  HENT: Negative.  Negative for nosebleeds.   Eyes: Negative.  Negative for blurred vision, double vision and photophobia.  Respiratory: Negative.  Negative for cough and shortness of breath.   Cardiovascular: Negative.  Negative for chest pain, palpitations and leg swelling.  Gastrointestinal: Negative.  Negative for heartburn, nausea and vomiting.  Musculoskeletal: Negative.  Negative for myalgias.  Neurological: Negative.  Negative for dizziness, focal weakness, seizures and headaches.  Psychiatric/Behavioral: Negative.  Negative for suicidal ideas.    Assessment and Plan: [name] was seen today for establish care.  Diagnoses and all orders for this visit:  Encounter to establish care  Essential hypertension Continue all  antihypertensives as prescribed.  Remember to bring in your blood pressure log with you for your follow up appointment.  DASH/Mediterranean Diets are healthier choices for HTN.     Follow Up Instructions Return for HTN.     I discussed the assessment and treatment plan with the patient. The patient was provided an opportunity to ask questions and all were answered. The patient agreed with the plan and demonstrated an understanding of the instructions.   The patient was advised to call back or seek an in-person evaluation if the symptoms worsen or if the condition fails to improve as anticipated.  I provided 14 minutes of non-face-to-face time during this encounter including median intraservice time, reviewing previous notes, labs, imaging, medications and explaining diagnosis and management.  Gildardo Pounds, FNP-BC

## 2019-12-14 ENCOUNTER — Other Ambulatory Visit (HOSPITAL_COMMUNITY)
Admission: RE | Admit: 2019-12-14 | Discharge: 2019-12-14 | Disposition: A | Discharge status: Home / Self Care | Payer: Managed Care, Other (non HMO) | Source: Ambulatory Visit | Attending: Obstetrics and Gynecology | Admitting: Obstetrics and Gynecology

## 2019-12-14 DIAGNOSIS — Z20822 Contact with and (suspected) exposure to covid-19: Secondary | ICD-10-CM | POA: Insufficient documentation

## 2019-12-14 DIAGNOSIS — Z01812 Encounter for preprocedural laboratory examination: Secondary | ICD-10-CM | POA: Insufficient documentation

## 2019-12-14 LAB — SARS CORONAVIRUS 2 (TAT 6-24 HRS): SARS Coronavirus 2: NEGATIVE

## 2019-12-15 NOTE — H&P (Signed)
[name] is an 34 y.o. female with a known persistent left ovarian cyst. W/U favors a benign process. Due to the persistent nature and size surgerical evaluation has been recommended to pt.   H/O C section x 2, BTL and RSO  Menstrual History: Menarche age: 72 No LMP recorded.    Past Medical History:  Diagnosis Date  . Abnormal Pap smear    colpo, ok since  . Cesarean delivery delivered 02/27/2013  . Chlamydia   . Headache(784.0)    OTC meds prn  . Hypertension   . Leg pain   . Lower back pain   . Ovarian cyst     Past Surgical History:  Procedure Laterality Date  . CESAREAN SECTION    . CESAREAN SECTION N/A 02/27/2013   Procedure: CESAREAN SECTION with tubal ligation;  Surgeon: Betsy Coder, MD;  Location: [facility];  Service: Obstetrics;  Laterality: N/A;  . CESAREAN SECTION    . DIAGNOSTIC LAPAROSCOPY  2007  . DILATION AND EVACUATION  10/27/2011   Procedure: DILATATION AND EVACUATION;  Surgeon: Betsy Coder, MD;  Location: [facility];  Service: Gynecology;  Laterality: N/A;  . DILATION AND EVACUATION  12/04/2011   Procedure: DILATATION AND EVACUATION;  Surgeon: Betsy Coder, MD;  Location: [facility];  Service: Gynecology;  Laterality: N/A;  . oophrectomy  2007   Right , d/t cyst  . TUBAL LIGATION      Family History  Problem Relation Age of Onset  . Hypertension Mother   . Hypertension Father   . Diabetes Paternal Aunt   . Hypertension Maternal Grandmother   . Cancer Maternal Grandmother   . Hypertension Maternal Grandfather   . Hypertension Paternal Grandmother   . Hypertension Paternal Grandfather   . Mental retardation Maternal Uncle   . Other Neg Hx     Social History:  reports that she quit smoking about 8 years ago. She has a 4.00 pack-year smoking history. She has never used smokeless tobacco. She reports current alcohol use of about 2.0 standard drinks of alcohol per week. She reports that she does not use drugs.  Allergies: No Known Allergies  No  medications prior to admission.    Review of Systems  Constitutional: Negative.   Respiratory: Negative.   Cardiovascular: Negative.   Gastrointestinal: Negative.   Genitourinary: Negative.     There were no vitals taken for this visit. Physical Exam Constitutional:      Appearance: Normal appearance.  Cardiovascular:     Rate and Rhythm: Normal rate and regular rhythm.     Heart sounds: Normal heart sounds.  Pulmonary:     Effort: Pulmonary effort is normal.     Breath sounds: Normal breath sounds.  Abdominal:     General: Abdomen is flat.     Palpations: Abdomen is soft.  Genitourinary:    Comments: Nl EGBUS, uterus small mobile, left adnexal fullness noted, mild tenderness Neurological:     Mental Status: She is alert.     Results for orders placed or performed during the hospital encounter of 12/14/19 (from the past 24 hour(s))  SARS CORONAVIRUS 2 (TAT 6-24 HRS) Nasopharyngeal Nasopharyngeal Swab     Status: None   Collection Time: 12/14/19  1:46 PM   Specimen: Nasopharyngeal Swab  Result Value Ref Range   SARS Coronavirus 2 NEGATIVE NEGATIVE    No results found.  Assessment/Plan: Persistent Left ovarian cyst.  Exp lap with ovarian cystectomy with possible oophorectomy has been reviewed and  discussed with the pt. She has verbalized understanding and desires to proceed.   Chancy Milroy 12/15/2019, 10:36 AM

## 2019-12-16 ENCOUNTER — Other Ambulatory Visit: Payer: Self-pay

## 2019-12-16 ENCOUNTER — Encounter (HOSPITAL_COMMUNITY): Payer: Self-pay | Admitting: Obstetrics and Gynecology

## 2019-12-16 NOTE — Progress Notes (Signed)
Spoke with pt for pre-op call. Pt denies cardiac history or Diabetes. Is treated for HTN.   Covid test done 12/14/19 and it's negative. Pt states she's been in quarantine since the test was done and understands that she stays in quarantine until she comes to the hospital tomorrow.

## 2019-12-17 ENCOUNTER — Encounter (HOSPITAL_COMMUNITY)
Admission: RE | Disposition: A | Discharge status: Home / Self Care | Payer: Self-pay | Source: Home / Self Care | Attending: Obstetrics and Gynecology

## 2019-12-17 ENCOUNTER — Inpatient Hospital Stay (HOSPITAL_COMMUNITY): Payer: Managed Care, Other (non HMO) | Admitting: Anesthesiology

## 2019-12-17 ENCOUNTER — Other Ambulatory Visit: Payer: Self-pay

## 2019-12-17 ENCOUNTER — Inpatient Hospital Stay (HOSPITAL_COMMUNITY)
Admission: RE | Admit: 2019-12-17 | Discharge: 2019-12-19 | DRG: 743 | Disposition: A | Discharge status: Home / Self Care | Payer: Managed Care, Other (non HMO) | Attending: Obstetrics and Gynecology | Admitting: Obstetrics and Gynecology

## 2019-12-17 ENCOUNTER — Encounter (HOSPITAL_COMMUNITY): Payer: Self-pay | Admitting: Obstetrics and Gynecology

## 2019-12-17 DIAGNOSIS — I10 Essential (primary) hypertension: Secondary | ICD-10-CM | POA: Diagnosis present

## 2019-12-17 DIAGNOSIS — Z9889 Other specified postprocedural states: Secondary | ICD-10-CM

## 2019-12-17 DIAGNOSIS — Z833 Family history of diabetes mellitus: Secondary | ICD-10-CM

## 2019-12-17 DIAGNOSIS — N83202 Unspecified ovarian cyst, left side: Secondary | ICD-10-CM | POA: Diagnosis present

## 2019-12-17 DIAGNOSIS — Z20822 Contact with and (suspected) exposure to covid-19: Secondary | ICD-10-CM | POA: Diagnosis present

## 2019-12-17 DIAGNOSIS — Z810 Family history of intellectual disabilities: Secondary | ICD-10-CM | POA: Diagnosis not present

## 2019-12-17 DIAGNOSIS — Z87891 Personal history of nicotine dependence: Secondary | ICD-10-CM

## 2019-12-17 DIAGNOSIS — Z8249 Family history of ischemic heart disease and other diseases of the circulatory system: Secondary | ICD-10-CM | POA: Diagnosis not present

## 2019-12-17 HISTORY — PX: EXPLORATORY LAPAROTOMY: SUR591

## 2019-12-17 HISTORY — PX: LAPAROTOMY: SHX154

## 2019-12-17 HISTORY — DX: Anemia, unspecified: D64.9

## 2019-12-17 LAB — BASIC METABOLIC PANEL
Anion gap: 11 (ref 5–15)
BUN: 14 mg/dL (ref 6–20)
CO2: 24 mmol/L (ref 22–32)
Calcium: 9.5 mg/dL (ref 8.9–10.3)
Chloride: 101 mmol/L (ref 98–111)
Creatinine, Ser: 0.82 mg/dL (ref 0.44–1.00)
GFR, Estimated: >60 mL/min (ref >60)
Glucose, Bld: 93 mg/dL (ref 70–99)
Potassium: 3.5 mmol/L (ref 3.5–5.1)
Sodium: 136 mmol/L (ref 135–145)

## 2019-12-17 LAB — TYPE AND SCREEN
ABO/RH(D): O POS
Antibody Screen: NEGATIVE

## 2019-12-17 LAB — CBC
HCT: 39.6 % (ref 36.0–46.0)
Hemoglobin: 12.3 g/dL (ref 12.0–15.0)
MCH: 25.5 pg — ABNORMAL LOW (ref 26.0–34.0)
MCHC: 31.1 g/dL (ref 30.0–36.0)
MCV: 82.2 fL (ref 80.0–100.0)
Platelets: 301 10*3/uL (ref 150–400)
RBC: 4.82 MIL/uL (ref 3.87–5.11)
RDW: 14.8 % (ref 11.5–15.5)
WBC: 7.9 10*3/uL (ref 4.0–10.5)
nRBC: 0 % (ref 0.0–0.2)

## 2019-12-17 LAB — POCT PREGNANCY, URINE: Preg Test, Ur: NEGATIVE

## 2019-12-17 SURGERY — LAPAROTOMY
Anesthesia: Regional | Site: Abdomen | Laterality: Left

## 2019-12-17 MED ORDER — OXYCODONE HCL 5 MG PO TABS: 5.0000 mg | ORAL_TABLET | Freq: Once | ORAL | Status: DC | PRN

## 2019-12-17 MED ORDER — OXYCODONE-ACETAMINOPHEN 5-325 MG PO TABS
2.0000 | ORAL_TABLET | ORAL | Status: DC | PRN
  Administered 2019-12-17: 2 via ORAL
  Filled 2019-12-17: qty 2

## 2019-12-17 MED ORDER — MIDAZOLAM HCL 2 MG/2ML IJ SOLN
INTRAMUSCULAR | Status: DC | PRN
  Administered 2019-12-17: 2 mg via INTRAVENOUS

## 2019-12-17 MED ORDER — SENNA 8.6 MG PO TABS
1.0000 | ORAL_TABLET | Freq: Two times a day (BID) | ORAL | Status: DC
  Administered 2019-12-17 – 2019-12-19 (×4): 8.6 mg via ORAL
  Filled 2019-12-17 (×4): qty 1

## 2019-12-17 MED ORDER — OXYCODONE-ACETAMINOPHEN 5-325 MG PO TABS
1.0000 | ORAL_TABLET | ORAL | Status: DC | PRN
  Administered 2019-12-18 (×2): 1 via ORAL
  Filled 2019-12-17 (×2): qty 1

## 2019-12-17 MED ORDER — ACETAMINOPHEN 10 MG/ML IV SOLN: 1000.0000 mg | Freq: Once | INTRAVENOUS | Status: DC | PRN

## 2019-12-17 MED ORDER — ONDANSETRON HCL 4 MG/2ML IJ SOLN
INTRAMUSCULAR | Status: DC | PRN
  Administered 2019-12-17: 4 mg via INTRAVENOUS

## 2019-12-17 MED ORDER — LIDOCAINE 2% (20 MG/ML) 5 ML SYRINGE
INTRAMUSCULAR | Status: DC | PRN
  Administered 2019-12-17: 20 mg via INTRAVENOUS

## 2019-12-17 MED ORDER — ONDANSETRON HCL 4 MG PO TABS: 4.0000 mg | ORAL_TABLET | Freq: Four times a day (QID) | ORAL | Status: DC | PRN

## 2019-12-17 MED ORDER — ROCURONIUM BROMIDE 10 MG/ML (PF) SYRINGE
PREFILLED_SYRINGE | INTRAVENOUS | Status: DC | PRN
  Administered 2019-12-17: 20 mg via INTRAVENOUS
  Administered 2019-12-17: 40 mg via INTRAVENOUS

## 2019-12-17 MED ORDER — SUGAMMADEX SODIUM 200 MG/2ML IV SOLN
INTRAVENOUS | Status: DC | PRN
  Administered 2019-12-17: 200 mg via INTRAVENOUS

## 2019-12-17 MED ORDER — FENTANYL CITRATE (PF) 100 MCG/2ML IJ SOLN
25.0000 ug | INTRAMUSCULAR | Status: DC | PRN
  Administered 2019-12-17 (×2): 50 ug via INTRAVENOUS

## 2019-12-17 MED ORDER — IBUPROFEN 800 MG PO TABS
800.0000 mg | ORAL_TABLET | Freq: Three times a day (TID) | ORAL | Status: DC
  Administered 2019-12-17 – 2019-12-19 (×5): 800 mg via ORAL
  Filled 2019-12-17 (×5): qty 1

## 2019-12-17 MED ORDER — SODIUM CHLORIDE 0.9 % IV SOLN
2.0000 g | INTRAVENOUS | Status: AC
  Administered 2019-12-17: 2 g via INTRAVENOUS
  Filled 2019-12-17: qty 2

## 2019-12-17 MED ORDER — ORAL CARE MOUTH RINSE: 15.0000 mL | Freq: Once | OROMUCOSAL | Status: AC

## 2019-12-17 MED ORDER — OXYCODONE HCL 5 MG/5ML PO SOLN: 5.0000 mg | Freq: Once | ORAL | Status: DC | PRN

## 2019-12-17 MED ORDER — SOD CITRATE-CITRIC ACID 500-334 MG/5ML PO SOLN
30.0000 mL | ORAL | Status: AC
  Administered 2019-12-17: 30 mL via ORAL
  Filled 2019-12-17: qty 15

## 2019-12-17 MED ORDER — ZOLPIDEM TARTRATE 5 MG PO TABS: 5.0000 mg | ORAL_TABLET | Freq: Every evening | ORAL | Status: DC | PRN

## 2019-12-17 MED ORDER — BUPIVACAINE-EPINEPHRINE (PF) 0.25% -1:200000 IJ SOLN
INTRAMUSCULAR | Status: DC | PRN
  Administered 2019-12-17 (×2): 15 mL

## 2019-12-17 MED ORDER — POVIDONE-IODINE 10 % EX SWAB
2.0000 "application " | Freq: Once | CUTANEOUS | Status: AC
  Administered 2019-12-17: 2 via TOPICAL

## 2019-12-17 MED ORDER — BUPIVACAINE LIPOSOME 1.3 % IJ SUSP
INTRAMUSCULAR | Status: DC | PRN
  Administered 2019-12-17 (×2): 10 mL

## 2019-12-17 MED ORDER — ONDANSETRON HCL 4 MG/2ML IJ SOLN
INTRAMUSCULAR | Status: AC
  Filled 2019-12-17: qty 2

## 2019-12-17 MED ORDER — FENTANYL CITRATE (PF) 250 MCG/5ML IJ SOLN
INTRAMUSCULAR | Status: AC
  Filled 2019-12-17: qty 5

## 2019-12-17 MED ORDER — FENTANYL CITRATE (PF) 100 MCG/2ML IJ SOLN
INTRAMUSCULAR | Status: AC
  Filled 2019-12-17: qty 2

## 2019-12-17 MED ORDER — ACETAMINOPHEN 160 MG/5ML PO SOLN: 1000.0000 mg | Freq: Once | ORAL | Status: DC | PRN

## 2019-12-17 MED ORDER — DEXAMETHASONE SODIUM PHOSPHATE 10 MG/ML IJ SOLN
INTRAMUSCULAR | Status: DC | PRN
  Administered 2019-12-17: 10 mg via INTRAVENOUS

## 2019-12-17 MED ORDER — KETOROLAC TROMETHAMINE 30 MG/ML IJ SOLN
30.0000 mg | Freq: Four times a day (QID) | INTRAMUSCULAR | Status: AC
  Administered 2019-12-17 – 2019-12-18 (×3): 30 mg via INTRAVENOUS
  Filled 2019-12-17 (×3): qty 1

## 2019-12-17 MED ORDER — ACETAMINOPHEN 500 MG PO TABS
1000.0000 mg | ORAL_TABLET | ORAL | Status: AC
  Administered 2019-12-17: 1000 mg via ORAL
  Filled 2019-12-17: qty 2

## 2019-12-17 MED ORDER — CHLORHEXIDINE GLUCONATE 0.12 % MT SOLN
15.0000 mL | Freq: Once | OROMUCOSAL | Status: AC
  Administered 2019-12-17: 15 mL via OROMUCOSAL
  Filled 2019-12-17: qty 15

## 2019-12-17 MED ORDER — LACTATED RINGERS IV SOLN: INTRAVENOUS | Status: DC

## 2019-12-17 MED ORDER — KETOROLAC TROMETHAMINE 15 MG/ML IJ SOLN
15.0000 mg | INTRAMUSCULAR | Status: AC
  Administered 2019-12-17: 15 mg via INTRAVENOUS
  Filled 2019-12-17: qty 1

## 2019-12-17 MED ORDER — LIDOCAINE 2% (20 MG/ML) 5 ML SYRINGE
INTRAMUSCULAR | Status: AC
  Filled 2019-12-17: qty 5

## 2019-12-17 MED ORDER — ROCURONIUM BROMIDE 10 MG/ML (PF) SYRINGE
PREFILLED_SYRINGE | INTRAVENOUS | Status: AC
  Filled 2019-12-17: qty 10

## 2019-12-17 MED ORDER — MIDAZOLAM HCL 2 MG/2ML IJ SOLN
INTRAMUSCULAR | Status: AC
  Filled 2019-12-17: qty 2

## 2019-12-17 MED ORDER — SIMETHICONE 80 MG PO CHEW: 80.0000 mg | CHEWABLE_TABLET | Freq: Four times a day (QID) | ORAL | Status: DC | PRN

## 2019-12-17 MED ORDER — ONDANSETRON HCL 4 MG/2ML IJ SOLN: 4.0000 mg | Freq: Four times a day (QID) | INTRAMUSCULAR | Status: DC | PRN

## 2019-12-17 MED ORDER — FENTANYL CITRATE (PF) 250 MCG/5ML IJ SOLN
INTRAMUSCULAR | Status: DC | PRN
Start: 2019-12-17 — End: 2019-12-17
  Administered 2019-12-17: 100 ug via INTRAVENOUS

## 2019-12-17 MED ORDER — PROPOFOL 10 MG/ML IV BOLUS
INTRAVENOUS | Status: AC
  Filled 2019-12-17: qty 20

## 2019-12-17 MED ORDER — ACETAMINOPHEN 500 MG PO TABS: 1000.0000 mg | ORAL_TABLET | Freq: Once | ORAL | Status: DC | PRN

## 2019-12-17 MED ORDER — PROPOFOL 10 MG/ML IV BOLUS
INTRAVENOUS | Status: DC | PRN
  Administered 2019-12-17: 100 mg via INTRAVENOUS

## 2019-12-17 MED ORDER — HYDROMORPHONE HCL 1 MG/ML IJ SOLN: 1.0000 mg | INTRAMUSCULAR | Status: DC | PRN

## 2019-12-17 MED ORDER — DEXAMETHASONE SODIUM PHOSPHATE 10 MG/ML IJ SOLN
INTRAMUSCULAR | Status: AC
  Filled 2019-12-17: qty 1

## 2019-12-17 SURGICAL SUPPLY — 48 items
BARRIER ADHS 3X4 INTERCEED (GAUZE/BANDAGES/DRESSINGS) IMPLANT
BENZOIN TINCTURE PRP APPL 2/3 (GAUZE/BANDAGES/DRESSINGS) ×3 IMPLANT
CANISTER SUCT 3000ML PPV (MISCELLANEOUS) ×3 IMPLANT
CLOSURE WOUND 1/2 X4 (GAUZE/BANDAGES/DRESSINGS) ×1
COVER WAND RF STERILE (DRAPES) ×3 IMPLANT
DECANTER SPIKE VIAL GLASS SM (MISCELLANEOUS) IMPLANT
DRAPE WARM FLUID 44X44 (DRAPES) IMPLANT
DRSG OPSITE POSTOP 4X10 (GAUZE/BANDAGES/DRESSINGS) ×3 IMPLANT
DURAPREP 26ML APPLICATOR (WOUND CARE) ×3 IMPLANT
GAUZE SPONGE 4X4 16PLY XRAY LF (GAUZE/BANDAGES/DRESSINGS) ×3 IMPLANT
GLOVE BIO SURGEON STRL SZ7.5 (GLOVE) ×6 IMPLANT
GLOVE BIOGEL PI IND STRL 7.0 (GLOVE) ×2 IMPLANT
GLOVE BIOGEL PI INDICATOR 7.0 (GLOVE) ×4
GOWN STRL REUS W/ TWL LRG LVL3 (GOWN DISPOSABLE) ×2 IMPLANT
GOWN STRL REUS W/ TWL XL LVL3 (GOWN DISPOSABLE) ×1 IMPLANT
GOWN STRL REUS W/TWL LRG LVL3 (GOWN DISPOSABLE) ×6
GOWN STRL REUS W/TWL XL LVL3 (GOWN DISPOSABLE) ×3
KIT TURNOVER KIT B (KITS) ×3 IMPLANT
NEEDLE HYPO 22GX1.5 SAFETY (NEEDLE) ×3 IMPLANT
NS IRRIG 1000ML POUR BTL (IV SOLUTION) ×3 IMPLANT
PACK ABDOMINAL GYN (CUSTOM PROCEDURE TRAY) ×3 IMPLANT
PAD OB MATERNITY 4.3X12.25 (PERSONAL CARE ITEMS) ×3 IMPLANT
RTRCTR C-SECT PINK 25CM LRG (MISCELLANEOUS) IMPLANT
RTRCTR WOUND ALEXIS 18CM SML (INSTRUMENTS) ×3
SAVER CELL AAL HAEMONETICS (INSTRUMENTS) ×1 IMPLANT
SPONGE INTESTINAL PEANUT (DISPOSABLE) ×6 IMPLANT
SPONGE LAP 18X18 RF (DISPOSABLE) ×6 IMPLANT
STRIP CLOSURE SKIN 1/2X4 (GAUZE/BANDAGES/DRESSINGS) ×2 IMPLANT
SUT PLAIN 2 0 (SUTURE) ×3
SUT PLAIN ABS 2-0 CT1 27XMFL (SUTURE) ×1 IMPLANT
SUT VIC AB 0 CT1 18XCR BRD8 (SUTURE) IMPLANT
SUT VIC AB 0 CT1 27 (SUTURE) ×15
SUT VIC AB 0 CT1 27XBRD ANBCTR (SUTURE) ×5 IMPLANT
SUT VIC AB 0 CT1 8-18 (SUTURE)
SUT VIC AB 1 CT1 18XBRD ANBCTR (SUTURE) IMPLANT
SUT VIC AB 1 CT1 36 (SUTURE) IMPLANT
SUT VIC AB 1 CT1 8-18 (SUTURE)
SUT VIC AB 2-0 CT1 27 (SUTURE) ×3
SUT VIC AB 2-0 CT1 TAPERPNT 27 (SUTURE) ×1 IMPLANT
SUT VIC AB 3-0 CT1 27 (SUTURE) ×6
SUT VIC AB 3-0 CT1 TAPERPNT 27 (SUTURE) ×2 IMPLANT
SUT VIC AB 3-0 SH 27 (SUTURE)
SUT VIC AB 3-0 SH 27X BRD (SUTURE) IMPLANT
SUT VIC AB 4-0 KS 27 (SUTURE) ×3 IMPLANT
SUT VICRYL 1 TIES 12X18 (SUTURE) ×3 IMPLANT
SYR CONTROL 10ML LL (SYRINGE) ×3 IMPLANT
TOWEL GREEN STERILE FF (TOWEL DISPOSABLE) ×6 IMPLANT
TRAY FOLEY W/BAG SLVR 14FR (SET/KITS/TRAYS/PACK) ×3 IMPLANT

## 2019-12-17 NOTE — Anesthesia Procedure Notes (Signed)
Anesthesia Regional Block: TAP block   Pre-Anesthetic Checklist: ,, timeout performed, Correct Patient, Correct Site, Correct Laterality, Correct Procedure, Correct Position, site marked, Risks and benefits discussed,  Surgical consent,  Pre-op evaluation,  At surgeon's request and post-op pain management  Laterality: Right and Left  Prep: chloraprep       Needles:  Injection technique: Single-shot  Needle Type: Echogenic Needle     Needle Length: 9cm  Needle Gauge: 21     Additional Needles:   Procedures:,,,, ultrasound used (permanent image in chart),,,,  Narrative:  Start time: 12/17/2019 12:03 PM End time: 12/17/2019 12:13 PM Injection made incrementally with aspirations every 5 mL.  Performed by: Personally  Anesthesiologist: Suzette Battiest, MD

## 2019-12-17 NOTE — Anesthesia Preprocedure Evaluation (Signed)
Anesthesia Evaluation  Patient identified by MRN, date of birth, ID band Patient awake    Reviewed: Allergy & Precautions, NPO status , Patient's Chart, lab work & pertinent test results  History of Anesthesia Complications Negative for: history of anesthetic complications  Airway Mallampati: I  TM Distance: >3 FB Neck ROM: Full    Dental  (+) Dental Advisory Given, Teeth Intact   Pulmonary neg shortness of breath, neg COPD, neg recent URI, former smoker,  Covid-19 Nucleic Acid Test Results Lab Results      Component                Value               Date                      [place]              NEGATIVE            12/14/2019                [place]              NEGATIVE            07/27/2019               breath sounds clear to auscultation       Cardiovascular hypertension, Pt. on medications (-) angina(-) Past MI and (-) CHF (-) dysrhythmias  Rhythm:Regular     Neuro/Psych  Headaches, negative psych ROS   GI/Hepatic negative GI ROS, Neg liver ROS,   Endo/Other  negative endocrine ROS  Renal/GU negative Renal ROS     Musculoskeletal negative musculoskeletal ROS (+)   Abdominal   Peds  Hematology negative hematology ROS (+)   Anesthesia Other Findings   Reproductive/Obstetrics                             Anesthesia Physical Anesthesia Plan  ASA: II  Anesthesia Plan: General and Regional   Post-op Pain Management:  Regional for Post-op pain   Induction: Intravenous  PONV Risk Score and Plan: 3 and Ondansetron and Dexamethasone  Airway Management Planned: Oral ETT  Additional Equipment: None  Intra-op Plan:   Post-operative Plan: Extubation in OR  Informed Consent: I have reviewed the patients History and Physical, chart, labs and discussed the procedure including the risks, benefits and alternatives for the proposed anesthesia with the patient or authorized  representative who has indicated his/her understanding and acceptance.     Dental advisory given  Plan Discussed with: CRNA and Surgeon  Anesthesia Plan Comments:         Anesthesia Quick Evaluation

## 2019-12-17 NOTE — Anesthesia Procedure Notes (Signed)
Procedure Name: Intubation Date/Time: 12/17/2019 12:04 PM Performed by: Michele Rockers, CRNA Pre-anesthesia Checklist: Patient identified, Suction available, Emergency Drugs available and Patient being monitored Patient Re-evaluated:Patient Re-evaluated prior to induction Oxygen Delivery Method: Circle system utilized Preoxygenation: Pre-oxygenation with 100% oxygen Induction Type: IV induction Ventilation: Mask ventilation without difficulty Laryngoscope Size: Mac and 3 Grade View: Grade I Tube type: Oral Tube size: 7.0 mm Number of attempts: 1 Airway Equipment and Method: Stylet Placement Confirmation: ETT inserted through vocal cords under direct vision,  positive ETCO2 and breath sounds checked- equal and bilateral Secured at: 21 cm Tube secured with: Tape Dental Injury: Teeth and Oropharynx as per pre-operative assessment

## 2019-12-17 NOTE — Anesthesia Postprocedure Evaluation (Signed)
Anesthesia Post Note  Patient: [name]  Procedure(s) Performed: EXPLORATORY LAPAROTOMY WITH LEFT OVARIAN CYSTECTOMY (Left Abdomen)     Patient location during evaluation: PACU Anesthesia Type: General Level of consciousness: awake and alert Pain management: pain level controlled Vital Signs Assessment: post-procedure vital signs reviewed and stable Respiratory status: spontaneous breathing, nonlabored ventilation, respiratory function stable and patient connected to nasal cannula oxygen Cardiovascular status: blood pressure returned to baseline and stable Postop Assessment: no apparent nausea or vomiting Anesthetic complications: no   No complications documented.  Last Vitals:  Vitals:   12/17/19 1600 12/17/19 1626  BP: 116/84 114/81  Pulse: 78 92  Resp: 16 18  Temp: 36.4 C 36.5 C  SpO2: 100% 100%    Last Pain:  Vitals:   12/17/19 1626  TempSrc: Oral  PainSc:                  Tiajuana Amass

## 2019-12-17 NOTE — Interval H&P Note (Signed)
History and Physical Interval Note:  12/17/2019 10:42 AM  [name]  has presented today for surgery, with the diagnosis of Left Ovarian Cyst.  The various methods of treatment have been discussed with the patient and family. After consideration of risks, benefits and other options for treatment, the patient has consented to  Procedure(s): LAPAROTOMY AND CYSTECTOMY (Left) as a surgical intervention.  The patient's history has been reviewed, patient examined, no change in status, stable for surgery.  I have reviewed the patient's chart and labs.  Questions were answered to the patient's satisfaction.     Chancy Milroy

## 2019-12-17 NOTE — Op Note (Signed)
PROCEDURE DATE:12/17/2019  PREOPERATIVE DIAGNOSIS:  Left Ovarian cyst POSTOPERATIVE DIAGNOSIS:  The same SURGEON:  Arlina Robes, M.D. ASSISTANT: Aletha Halim, M.D.  An experienced assistant was required given the standard of surgical care given the complexity of the case.  This assistant was needed for exposure, dissection, suctioning, retraction, and for overall help during the procedure.   ANESTHESIOLOGIST: As recorded OPERATION:  Exploratory laparotomy,   Left ovarian cystectomy ANESTHESIA:  General endotracheal.  INDICATIONS: The patient is a 34 y.o. [id number] with  Persistent left ovarian who desired definitive surgical management. On the preoperative visit, the risks, benefits, indications, and alternatives of the procedure were reviewed with the patient.  On the day of surgery, the risks of surgery were again discussed with the patient including but not limited to: bleeding which may require transfusion or reoperation; infection which may require antibiotics; injury to bowel, bladder, ureters or other surrounding organs; need for additional procedures; thromboembolic phenomenon, incisional problems and other postoperative/anesthesia complications. Written informed consent was obtained.    OPERATIVE FINDINGS: A 6 week size uterus with small right ovary and normal right tube. Large left ovarian cyst @ 10 cm x 10 cm and  normal   No other anomalies noted in pelvis, no lesions of endometriosis.  Cyst fluid was straw color in nature.   ESTIMATED BLOOD LOSS: 50 ml FLUIDS: As recorded URINE OUTPUT:  As recorded SPECIMENS:  Cyst fluid and left ovarian cyst wall COMPLICATIONS:  None immediate.  DESCRIPTION OF PROCEDURE:  The patient received intravenous antibiotics and had sequential compression devices applied to her lower extremities while in the preoperative area.   She was taken to the operating room and placed under general anesthesia without difficulty.The abdomen and perineum were  prepped and draped in a sterile manner, and she was placed in a dorsal supine position.  A Foley catheter was inserted into the bladder and attached to constant drainage. After an adequate timeout was performed, a Pfannensteil skin incision was made. This incision was taken down to the fascia using electrocautery with care given to maintain good hemostasis. The fascia was incised in the midline and the fascial incision was then extended bilaterally using electrocautery without difficulty. The fascia was then dissected off the underlying rectus muscles using blunt and sharp dissection. The rectus muscles were split bluntly in the midline and the peritoneum entered sharply without complication. This peritoneal incision was then extended superiorly and inferiorly with care given to prevent bowel or bladder injury.  Attention was then turned to the pelvis. A small Alxis retractor was placed. The left ovarian cyst  was noted to be mobilized. A small incision was made in the cyst and the deflated ovary was elevated through the incision. The cyst wall was then sharply and bluntly dissected from the underlying ovarian tissue. The cyst wall was removed in tact. The ovary was then closed with 2/0 Vicryl, closing the dead space. Hemostasis was noted.   The pelvis was irrigated and hemostasis was reconfirmed. The retractor was removed  The peritoneum was closed with a 2-0 Vicryl running stitch, and the fascia was also closed in a running fashion with 0 Vicryl suture.  The skin was closed with a 4-0 Vicryl subcuticular stitch. Sponge, lap, needle, and instrument counts were correct times two. The patient was taken to the recovery area awake, extubated and in stable condition.  Arlina Robes, MD, [place] Attending [facility], [facility]

## 2019-12-17 NOTE — Transfer of Care (Signed)
Immediate Anesthesia Transfer of Care Note  Patient: [name]  Procedure(s) Performed: EXPLORATORY LAPAROTOMY WITH LEFT OVARIAN CYSTECTOMY (Left Abdomen)  Patient Location: PACU  Anesthesia Type:General  Level of Consciousness: drowsy, patient cooperative and responds to stimulation  Airway & Oxygen Therapy: Patient Spontanous Breathing and Patient connected to nasal cannula oxygen  Post-op Assessment: Report given to RN, Post -op Vital signs reviewed and stable and Patient moving all extremities X 4  Post vital signs: Reviewed and stable  Last Vitals:  Vitals Value Taken Time  BP 108/96 12/17/19 1330  Temp 36.7 C 12/17/19 1330  Pulse 68 12/17/19 1333  Resp 15 12/17/19 1333  SpO2 100 % 12/17/19 1333  Vitals shown include unvalidated device data.  Last Pain:  Vitals:   12/17/19 1330  TempSrc:   PainSc: 1       Patients Stated Pain Goal: 4 (85/90/93 1121)  Complications: No complications documented.

## 2019-12-18 ENCOUNTER — Encounter (HOSPITAL_COMMUNITY): Payer: Self-pay | Admitting: Obstetrics and Gynecology

## 2019-12-18 LAB — SURGICAL PATHOLOGY

## 2019-12-18 LAB — CYTOLOGY - NON PAP

## 2019-12-18 NOTE — Progress Notes (Signed)
1 Day Post-Op Procedure(s) (LRB): EXPLORATORY LAPAROTOMY WITH LEFT OVARIAN CYSTECTOMY (Left)  Subjective: Ms Debono reports feeling sore. Pain controlled. Tolerating diet. Voiding without probelms   Objective: AF VSS  Lungs clear Heart RRR Abd soft + BS drsg intact Ext non tender  Assessment:/Plan s/p Procedure(s): EXPLORATORY LAPAROTOMY WITH LEFT OVARIAN CYSTECTOMY (Left):  LOS: 1 day  Doing well. Surgery reviewed. Continue with progressive care. Plan discharge home tomorrow.   Chancy Milroy 12/18/2019, 9:00 AM

## 2019-12-19 MED ORDER — IBUPROFEN 800 MG PO TABS
800.0000 mg | ORAL_TABLET | Freq: Three times a day (TID) | ORAL | 1 refills | Status: DC
Start: 2019-12-19 — End: 2020-03-19

## 2019-12-19 MED ORDER — OXYCODONE-ACETAMINOPHEN 5-325 MG PO TABS
1.0000 | ORAL_TABLET | ORAL | 0 refills | Status: DC | PRN
Start: 2019-12-19 — End: 2020-01-22

## 2019-12-19 NOTE — Plan of Care (Signed)
  Problem: Education: Goal: Knowledge of General Education information will improve Description: Including pain rating scale, medication(s)/side effects and non-pharmacologic comfort measures Outcome: Adequate for Discharge   

## 2019-12-19 NOTE — Discharge Summary (Signed)
Physician Discharge Summary  Patient ID: [name] MRN: [id number] DOB/AGE: [birth year] 34 y.o.  Admit date: 12/17/2019 Discharge date: 12/19/2019  Admission Diagnoses: Ovarian cyst  Discharge Diagnoses:  Active Problems:   Cyst of left ovary   Post-operative state   Discharged Condition: good  Hospital Course: [name] was admitted with above Dx. She underwent Ex lap with ovarian cystectomy without problems. See OP note for additional information. Pt'Yolanda post op course was unremarkable. She progressed to ambulating, voiding, passing flatus, tolerating diet and good oral pain control. [name] amendable for discharge home on POD # 2. Discharge instructions, medications and follow up were reviewed with pt.[name] verbalized understanding.  Consults: None  Significant Diagnostic Studies: labs  Treatments: surgery: Ovarian cystectomy  Discharge Exam: Blood pressure 123/87, pulse 76, temperature 98.1 F (36.7 C), temperature source Oral, resp. rate 18, height 5\' 3"  (1.6 m), weight 59 kg, last menstrual period 12/09/2019, SpO2 100 %.  Lungs clear Heart RRR Abd soft + BS drsg intact Ext non tedner  Disposition: Discharge disposition: 01-Home or Self Care       Discharge Instructions    Call MD for:  difficulty breathing, headache or visual disturbances   Complete by: As directed    Call MD for:  extreme fatigue   Complete by: As directed    Call MD for:  hives   Complete by: As directed    Call MD for:  persistant dizziness or light-headedness   Complete by: As directed    Call MD for:  persistant nausea and vomiting   Complete by: As directed    Call MD for:  redness, tenderness, or signs of infection (pain, swelling, redness, odor or green/yellow discharge around incision site)   Complete by: As directed    Call MD for:  severe uncontrolled pain   Complete by: As directed    Call MD for:  temperature >100.4   Complete by: As directed    Diet - low sodium heart  healthy   Complete by: As directed    Discharge wound care:   Complete by: As directed    Remove dressing 12/22/19 Remove stri strips 12/29/19   Increase activity slowly   Complete by: As directed    Sexual Activity Restrictions   Complete by: As directed    Pelvic rest x 4 weeks     Allergies as of 12/19/2019   No Known Allergies     Medication List    STOP taking these medications   aspirin-acetaminophen-caffeine 250-250-65 MG tablet Commonly known as: EXCEDRIN MIGRAINE   GOODY HEADACHE PO     TAKE these medications   hydrochlorothiazide 25 MG tablet Commonly known as: HYDRODIURIL Take 1 tablet (25 mg total) by mouth daily.   ibuprofen 800 MG tablet Commonly known as: ADVIL Take 1 tablet (800 mg total) by mouth 3 (three) times daily.   oxyCODONE-acetaminophen 5-325 MG tablet Commonly known as: PERCOCET/ROXICET Take 1 tablet by mouth every 4 (four) hours as needed for moderate pain.            Discharge Care Instructions  (From admission, onward)         Start     Ordered   12/19/19 0000  Discharge wound care:       Comments: Remove dressing 12/22/19 Remove stri strips 12/29/19   12/19/19 0905          Follow-up Information    [facility]. Schedule  an appointment as soon as possible for a visit in 4 week(Yolanda).   Specialty: Obstetrics and Gynecology Why: Post op appt with Dr. Gardiner Fanti Contact information: [place] [address] [phone]              Signed: Chancy [name] 12/19/2019, 9:06 AM

## 2019-12-19 NOTE — Discharge Instructions (Signed)
Ovarian Cystectomy, Care After  This sheet gives you information about how to care for yourself after your procedure. Your health care provider may also give you more specific instructions. If you have problems or questions, contact your health care provider.  What can I expect after the procedure?  After the procedure, it is common to have:  · Pain in the abdomen, especially at the incision areas. You will be given pain medicines to control the pain.  · Tiredness. This is a normal part of the recovery process. Your energy level will return to normal over the next several weeks.  Follow these instructions at home:  Medicines  · Take over-the-counter and prescription medicines only as told by your health care provider.  · If you were prescribed an antibiotic medicine, use it as told by your health care provider. Do not stop using the antibiotic even if you start to feel better.  · Do not take aspirin because it can cause bleeding.  · Ask your health care provider if the medicine prescribed to you:  ? Requires you to avoid driving or using heavy machinery.  ? Can cause constipation. You may need to take these actions to prevent or treat constipation:  § Drink enough fluid to keep your urine pale yellow.  § Take over-the-counter or prescription medicines.  § Eat foods that are high in fiber, such as beans, whole grains, and fresh fruits and vegetables.  § Limit foods that are high in fat and processed sugars, such as fried or sweet foods.  Incision care    · Follow instructions from your health care provider about how to take care of your incisions. Make sure you:  ? Wash your hands with soap and water before and after you change your bandage (dressing). If soap and water are not available, use hand sanitizer.  ? Change your dressing as told by your health care provider.  ? Leave stitches (sutures), skin glue, or adhesive strips in place. These skin closures may need to stay in place for 2 weeks or longer. If adhesive  strip edges start to loosen and curl up, you may trim the loose edges. Do not remove adhesive strips completely unless your health care provider tells you to do that.  · Check your incision areas every day for signs of infection. Check for:  ? Redness, swelling, or pain.  ? Fluid or blood.  ? Warmth.  ? Pus or a bad smell.  · Do not take baths, swim, or use a hot tub until your health care provider approves. Ask your health care provider if you may take showers. You may only be allowed to take sponge baths.  Activity  · Rest as told by your health care provider.  · Avoid sitting for a long time without moving. Get up to take short walks every 1-2 hours. This is important to improve blood flow and breathing. Ask for help if you feel weak or unsteady.  · Do not lift anything that is heavier than 10 lb (4.5 kg), or the limit that you are told, until your health care provider says that it is safe.  · Return to your normal activities and diet as told by your health care provider. Ask your health care provider what activities are safe for you.  General instructions  · Do not douche, use tampons, or have sexual intercourse until your health care provider says it is okay to do so.  · Do not use any products that   contain nicotine or tobacco, such as cigarettes, e-cigarettes, and chewing tobacco. These can delay incision healing after surgery. If you need help quitting, ask your health care provider.  · Keep all follow-up visits as told by your health care provider. This is important.  Contact a health care provider if:  · You have a fever.  · You feel nauseous or you vomit.  · You have pain when you urinate or have blood in your urine.  · You have a rash on your body.  · You have pain or redness where the IV was inserted.  · You have pain that is not relieved with medicine.  · You have any of these signs of infection:  ? Redness, swelling, or pain around your incisions.  ? Fluid or blood coming from your incisions.  ? Warmth  coming from an incision.  ? Pus or a bad smell coming from your incisions.  Get help right away if:  · You have chest pain or shortness of breath.  · You feel dizzy or light-headed.  · You have heavy bleeding.  · You have increasing abdominal pain that is not relieved with medicines.  · You have pain, swelling, or redness in your leg.  · Your incision is opening (the edges are not staying together).  Summary  · After the procedure, it is common to have some pain in your abdomen. You will be given pain medicines to control the pain.  · Follow instructions from your health care provider about how to take care of your incisions.  · Do not douche, use tampons, or have sexual intercourse until your health care provider says it is okay to do so.  · Keep all follow-up visits as told by your health care provider. This is important.  This information is not intended to replace advice given to you by your health care provider. Make sure you discuss any questions you have with your health care provider.  Document Revised: 09/06/2018 Document Reviewed: 09/06/2018  Elsevier Patient Education © 2020 [employer]vier Inc[employer]

## 2019-12-20 ENCOUNTER — Telehealth: Payer: Self-pay

## 2019-12-20 NOTE — Telephone Encounter (Signed)
Transition Care Management Follow-up Telephone Call  Date of discharge and from where:[facility] How have you been since you were released from the hospital? OK Any questions or concerns? No questions/concerns reported.  Items Reviewed: Did the pt receive and understand the discharge instructions provided? Stated that have the instructions and have no questions.  Medications obtained and verified? She said have the medication list  and the hospital staff reviewed them in detail prior to discharge. She said that he has all of the medications and they have no questions.  Any new allergies since your discharge? None reported  Do you have support at home? Yes, kids Other (ie: DME, Home Health, etc)       Functional Questionnaire: (I = Independent and D = Dependent) ADL's:  Independent.      Follow up appointments reviewed:    PCP Hospital f/u appt confirmed?12/25/2019 @ 1050 with [facility] f/u appt confirmed? None scheduled at this time   Are transportation arrangements needed? They have transportation    If their condition worsens, is the pt aware to call  their PCP or go to the ED? Yes.Made pt aware if condition worsen or start experiencing rapid weight gain, chest pain, diff breathing, SOB, high fevers, or bleading to refer imediately to ED for further evaluation.   Was the patient provided with contact information for the PCP's office or ED? He has the phone number   Was the pt encouraged to call back with questions or concerns?yes DME none

## 2019-12-23 DIAGNOSIS — Z419 Encounter for procedure for purposes other than remedying health state, unspecified: Secondary | ICD-10-CM | POA: Diagnosis not present

## 2019-12-25 ENCOUNTER — Other Ambulatory Visit: Payer: Self-pay

## 2019-12-25 ENCOUNTER — Ambulatory Visit: Payer: Managed Care, Other (non HMO) | Attending: Nurse Practitioner | Admitting: Nurse Practitioner

## 2019-12-31 DIAGNOSIS — F411 Generalized anxiety disorder: Secondary | ICD-10-CM | POA: Diagnosis not present

## 2020-01-20 ENCOUNTER — Ambulatory Visit (INDEPENDENT_AMBULATORY_CARE_PROVIDER_SITE_OTHER): Payer: Managed Care, Other (non HMO) | Admitting: Obstetrics and Gynecology

## 2020-01-20 ENCOUNTER — Encounter: Payer: Self-pay | Admitting: Obstetrics and Gynecology

## 2020-01-20 ENCOUNTER — Other Ambulatory Visit: Payer: Self-pay

## 2020-01-20 VITALS — BP 131/93 | HR 92 | Ht 63.0 in | Wt 138.5 lb

## 2020-01-20 DIAGNOSIS — Z9889 Other specified postprocedural states: Secondary | ICD-10-CM

## 2020-01-20 NOTE — Patient Instructions (Signed)
Health Maintenance, Female Adopting a healthy lifestyle and getting preventive care are important in promoting health and wellness. Ask your health care provider about:  The right schedule for you to have regular tests and exams.  Things you can do on your own to prevent diseases and keep yourself healthy. What should I know about diet, weight, and exercise? Eat a healthy diet   Eat a diet that includes plenty of vegetables, fruits, low-fat dairy products, and lean protein.  Do not eat a lot of foods that are high in solid fats, added sugars, or sodium. Maintain a healthy weight Body mass index (BMI) is used to identify weight problems. It estimates body fat based on height and weight. Your health care provider can help determine your BMI and help you achieve or maintain a healthy weight. Get regular exercise Get regular exercise. This is one of the most important things you can do for your health. Most adults should:  Exercise for at least 150 minutes each week. The exercise should increase your heart rate and make you sweat (moderate-intensity exercise).  Do strengthening exercises at least twice a week. This is in addition to the moderate-intensity exercise.  Spend less time sitting. Even light physical activity can be beneficial. Watch cholesterol and blood lipids Have your blood tested for lipids and cholesterol at 35 years of age, then have this test every 5 years. Have your cholesterol levels checked more often if:  Your lipid or cholesterol levels are high.  You are older than 35 years of age.  You are at high risk for heart disease. What should I know about cancer screening? Depending on your health history and family history, you may need to have cancer screening at various ages. This may include screening for:  Breast cancer.  Cervical cancer.  Colorectal cancer.  Skin cancer.  Lung cancer. What should I know about heart disease, diabetes, and high blood  pressure? Blood pressure and heart disease  High blood pressure causes heart disease and increases the risk of stroke. This is more likely to develop in people who have high blood pressure readings, are of African descent, or are overweight.  Have your blood pressure checked: ? Every 3-5 years if you are 18-39 years of age. ? Every year if you are 40 years old or older. Diabetes Have regular diabetes screenings. This checks your fasting blood sugar level. Have the screening done:  Once every three years after age 40 if you are at a normal weight and have a low risk for diabetes.  More often and at a younger age if you are overweight or have a high risk for diabetes. What should I know about preventing infection? Hepatitis B If you have a higher risk for hepatitis B, you should be screened for this virus. Talk with your health care provider to find out if you are at risk for hepatitis B infection. Hepatitis C Testing is recommended for:  Everyone born from 1945 through 1965.  Anyone with known risk factors for hepatitis C. Sexually transmitted infections (STIs)  Get screened for STIs, including gonorrhea and chlamydia, if: ? You are sexually active and are younger than 35 years of age. ? You are older than 35 years of age and your health care provider tells you that you are at risk for this type of infection. ? Your sexual activity has changed since you were last screened, and you are at increased risk for chlamydia or gonorrhea. Ask your health care provider if   you are at risk.  Ask your health care provider about whether you are at high risk for HIV. Your health care provider may recommend a prescription medicine to help prevent HIV infection. If you choose to take medicine to prevent HIV, you should first get tested for HIV. You should then be tested every 3 months for as long as you are taking the medicine. Pregnancy  If you are about to stop having your period (premenopausal) and  you may become pregnant, seek counseling before you get pregnant.  Take 400 to 800 micrograms (mcg) of folic acid every day if you become pregnant.  Ask for birth control (contraception) if you want to prevent pregnancy. Osteoporosis and menopause Osteoporosis is a disease in which the bones lose minerals and strength with aging. This can result in bone fractures. If you are 65 years old or older, or if you are at risk for osteoporosis and fractures, ask your health care provider if you should:  Be screened for bone loss.  Take a calcium or vitamin D supplement to lower your risk of fractures.  Be given hormone replacement therapy (HRT) to treat symptoms of menopause. Follow these instructions at home: Lifestyle  Do not use any products that contain nicotine or tobacco, such as cigarettes, e-cigarettes, and chewing tobacco. If you need help quitting, ask your health care provider.  Do not use street drugs.  Do not share needles.  Ask your health care provider for help if you need support or information about quitting drugs. Alcohol use  Do not drink alcohol if: ? Your health care provider tells you not to drink. ? You are pregnant, may be pregnant, or are planning to become pregnant.  If you drink alcohol: ? Limit how much you use to 0-1 drink a day. ? Limit intake if you are breastfeeding.  Be aware of how much alcohol is in your drink. In the [place] one drink equals one 12 oz bottle of beer (355 mL), one 5 oz glass of wine (148 mL), or one 1 oz glass of hard liquor (44 mL). General instructions  Schedule regular health, dental, and eye exams.  Stay current with your vaccines.  Tell your health care provider if: ? You often feel depressed. ? You have ever been abused or do not feel safe at home. Summary  Adopting a healthy lifestyle and getting preventive care are important in promoting health and wellness.  Follow your health care provider's instructions about healthy  diet, exercising, and getting tested or screened for diseases.  Follow your health care provider's instructions on monitoring your cholesterol and blood pressure. This information is not intended to replace advice given to you by your health care provider. Make sure you discuss any questions you have with your health care provider. Document Revised: 01/31/2018 Document Reviewed: 01/31/2018 Elsevier Patient Education  2020 Elsevier Inc.  

## 2020-01-20 NOTE — Progress Notes (Signed)
[name] presents for post op appt. 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 Pathology reviewed with pt. Denies any bowel or bladder dysfunction Has had a cycle since surgery Occ pain in LLU at times, does not require any pain medication   PE AF VSS Lungs clear Heart RRR Abd sof,t + BS, incision well healed  A/P Post OP appt        HTN  Return to nl ADL's. To see PCP for continued management of her HTN, F/U PRN

## 2020-01-22 ENCOUNTER — Ambulatory Visit: Payer: Managed Care, Other (non HMO) | Attending: Nurse Practitioner | Admitting: Nurse Practitioner

## 2020-01-22 ENCOUNTER — Other Ambulatory Visit: Payer: Self-pay

## 2020-01-22 ENCOUNTER — Encounter: Payer: Self-pay | Admitting: Nurse Practitioner

## 2020-01-22 VITALS — BP 120/78 | HR 81 | Temp 97.6°F | Ht 63.5 in | Wt 140.8 lb

## 2020-01-22 DIAGNOSIS — I10 Essential (primary) hypertension: Secondary | ICD-10-CM | POA: Diagnosis not present

## 2020-01-22 DIAGNOSIS — Z419 Encounter for procedure for purposes other than remedying health state, unspecified: Secondary | ICD-10-CM | POA: Diagnosis not present

## 2020-01-22 MED ORDER — HYDROCHLOROTHIAZIDE 25 MG PO TABS: 25.0000 mg | ORAL_TABLET | Freq: Every day | ORAL | 3 refills | Status: DC

## 2020-01-22 NOTE — Progress Notes (Signed)
Assessment & Plan:  [name] was seen today for hypertension.  Diagnoses and all orders for this visit:  Essential hypertension -     hydrochlorothiazide (HYDRODIURIL) 25 MG tablet; Take 1 tablet (25 mg total) by mouth daily. Continue all antihypertensives as prescribed.  Remember to bring in your blood pressure log with you for your follow up appointment.  DASH/Mediterranean Diets are healthier choices for HTN.    Patient has been counseled on age-appropriate routine health concerns for screening and prevention. These are reviewed and up-to-date. Referrals have been placed accordingly. Immunizations are up-to-date or declined.    Subjective:   Chief Complaint  Patient presents with   Hypertension    Pt. is here for blood presssure follow up.    HPI [name] 34 y.o. female presents to office today for follow up to HTN. She is still a little sore from her exp lap with left ovarian cystectomy on 12-17-2019 (pathology normal).    Essential Hypertension Well controlled. Taking HCTZ 25 mg daily. Denies chest pain, shortness of breath, palpitations, lightheadedness, dizziness, headaches or BLE edema.   BP Readings from Last 3 Encounters:  01/22/20 120/78  01/20/20 (!) 131/93  12/19/19 123/87    Review of Systems  Constitutional: Negative for fever, malaise/fatigue and weight loss.  HENT: Negative.  Negative for nosebleeds.   Eyes: Negative.  Negative for blurred vision, double vision and photophobia.  Respiratory: Negative.  Negative for cough and shortness of breath.   Cardiovascular: Negative.  Negative for chest pain, palpitations and leg swelling.  Gastrointestinal: Negative.  Negative for heartburn, nausea and vomiting.  Musculoskeletal: Negative.  Negative for myalgias.  Neurological: Negative.  Negative for dizziness, focal weakness, seizures and headaches.  Psychiatric/Behavioral: Negative.  Negative for suicidal ideas.    Past Medical History:  Diagnosis  Date   Abnormal Pap smear    colpo, ok since   Anemia    low iron   Cesarean delivery delivered 02/27/2013   Chlamydia    Headache(784.0)    OTC meds prn   Hypertension    Leg pain    Lower back pain    Ovarian cyst     Past Surgical History:  Procedure Laterality Date   CESAREAN SECTION     CESAREAN SECTION N/A 02/27/2013   Procedure: CESAREAN SECTION with tubal ligation;  Surgeon: Betsy Coder, MD;  Location: [facility];  Service: Obstetrics;  Laterality: N/A;   CESAREAN SECTION     DIAGNOSTIC LAPAROSCOPY  2007   DILATION AND EVACUATION  10/27/2011   Procedure: DILATATION AND EVACUATION;  Surgeon: Betsy Coder, MD;  Location: [facility];  Service: Gynecology;  Laterality: N/A;   DILATION AND EVACUATION  12/04/2011   Procedure: DILATATION AND EVACUATION;  Surgeon: Betsy Coder, MD;  Location: [facility];  Service: Gynecology;  Laterality: N/A;   EXPLORATORY LAPAROTOMY  12/17/2019   EXPLORATORY LAPAROTOMY WITH LEFT OVARIAN CYSTECTOMY (Left Abdomen)   LAPAROTOMY Left 12/17/2019   Procedure: EXPLORATORY LAPAROTOMY WITH LEFT OVARIAN CYSTECTOMY;  Surgeon: Chancy Milroy, MD;  Location: [facility];  Service: Gynecology;  Laterality: Left;   oophrectomy  2007   Right , d/t cyst   TUBAL LIGATION      Family History  Problem Relation Age of Onset   Hypertension Mother    Hypertension Father    Diabetes Paternal Aunt    Hypertension Maternal Grandmother    Cancer Maternal Grandmother    Hypertension Maternal Grandfather    Hypertension Paternal Grandmother  Hypertension Paternal Grandfather    Mental retardation Maternal Uncle    Other Neg Hx     Social History Reviewed with no changes to be made today.   Outpatient Medications Prior to Visit  Medication Sig Dispense Refill   hydrochlorothiazide (HYDRODIURIL) 25 MG tablet Take 1 tablet (25 mg total) by mouth daily. 90 tablet 3   ibuprofen (ADVIL) 800 MG tablet Take 1 tablet (800 mg total) by mouth 3  (three) times daily. (Patient not taking: Reported on 01/22/2020) 30 tablet 1   oxyCODONE-acetaminophen (PERCOCET/ROXICET) 5-325 MG tablet Take 1 tablet by mouth every 4 (four) hours as needed for moderate pain. (Patient not taking: Reported on 01/22/2020) 24 tablet 0   Facility-Administered Medications Prior to Visit  Medication Dose Route Frequency Provider Last Rate Last Admin   lactated ringers infusion   Intravenous Continuous PRN Michele Rockers, CRNA   New Bag at 07/30/19 1302    No Known Allergies     Objective:    BP 120/78 (BP Location: Left Arm, Patient Position: Sitting, Cuff Size: Normal)    Pulse 81    Temp 97.6 F (36.4 C) (Temporal)    Ht 5' 3.5" (1.613 m)    Wt 140 lb 12.8 oz (63.9 kg)    LMP 01/02/2020    SpO2 100%    BMI 24.55 kg/m  Wt Readings from Last 3 Encounters:  01/22/20 140 lb 12.8 oz (63.9 kg)  01/20/20 138 lb 8 oz (62.8 kg)  12/17/19 130 lb (59 kg)    Physical Exam Vitals and nursing note reviewed.  Constitutional:      Appearance: She is well-developed.  HENT:     Head: Normocephalic and atraumatic.  Cardiovascular:     Rate and Rhythm: Normal rate and regular rhythm.     Heart sounds: Normal heart sounds. No murmur heard.  No friction rub. No gallop.   Pulmonary:     Effort: Pulmonary effort is normal. No tachypnea or respiratory distress.     Breath sounds: Normal breath sounds. No decreased breath sounds, wheezing, rhonchi or rales.  Chest:     Chest wall: No tenderness.  Abdominal:     General: Bowel sounds are normal.     Palpations: Abdomen is soft.  Musculoskeletal:        General: Normal range of motion.     Cervical back: Normal range of motion.  Skin:    General: Skin is warm and dry.  Neurological:     Mental Status: She is alert and oriented to person, place, and time.     Coordination: Coordination normal.  Psychiatric:        Behavior: Behavior normal. Behavior is cooperative.        Thought Content: Thought content  normal.        Judgment: Judgment normal.          Patient has been counseled extensively about nutrition and exercise as well as the importance of adherence with medications and regular follow-up. The patient was given clear instructions to go to ER or return to medical center if symptoms don't improve, worsen or new problems develop. The patient verbalized understanding.   Follow-up: Return in about 3 months (around 04/21/2020).   Gildardo Pounds, FNP-BC [facility] [place], [place]   01/22/2020, 11:23 AM

## 2020-02-07 ENCOUNTER — Ambulatory Visit: Payer: Managed Care, Other (non HMO) | Admitting: Nurse Practitioner

## 2020-02-22 DIAGNOSIS — Z419 Encounter for procedure for purposes other than remedying health state, unspecified: Secondary | ICD-10-CM | POA: Diagnosis not present

## 2020-03-17 ENCOUNTER — Ambulatory Visit: Payer: Self-pay | Admitting: *Deleted

## 2020-03-17 NOTE — Telephone Encounter (Signed)
Patient is calling to report L ear pain for 1 week- neck and head pain present too. Called the office for appointment- they are going to give patient her options and schedule her.  Reason for Disposition . Earache  (Exceptions: brief ear pain of < 60 minutes duration, earache occurring during air travel  Answer Assessment - Initial Assessment Questions 1. LOCATION: "Which ear is involved?"     Left ear pain 2. ONSET: "When did the ear start hurting"      1 week 3. SEVERITY: "How bad is the pain?"  (Scale 1-10; mild, moderate or severe)   - MILD (1-3): doesn't interfere with normal activities    - MODERATE (4-7): interferes with normal activities or awakens from sleep    - SEVERE (8-10): excruciating pain, unable to do any normal activities      moderate 4. URI SYMPTOMS: "Do you have a runny nose or cough?"     no 5. FEVER: "Do you have a fever?" If Yes, ask: "What is your temperature, how was it measured, and when did it start?"     no 6. CAUSE: "Have you been swimming recently?", "How often do you use Q-TIPS?", "Have you had any recent air travel or scuba diving?"     no 7. OTHER SYMPTOMS: "Do you have any other symptoms?" (e.g., headache, stiff neck, dizziness, vomiting, runny nose, decreased hearing)     Neck pain, headache 8. PREGNANCY: "Is there any chance you are pregnant?" "When was your last menstrual period?"     n/a  Protocols used: EARACHE-A-AH

## 2020-03-19 ENCOUNTER — Other Ambulatory Visit: Payer: Self-pay | Admitting: Family Medicine

## 2020-03-19 ENCOUNTER — Encounter: Payer: Self-pay | Admitting: Family Medicine

## 2020-03-19 ENCOUNTER — Ambulatory Visit: Payer: Managed Care, Other (non HMO) | Attending: Family Medicine | Admitting: Family Medicine

## 2020-03-19 ENCOUNTER — Other Ambulatory Visit: Payer: Self-pay

## 2020-03-19 VITALS — BP 139/101 | HR 75 | Ht 63.0 in | Wt 146.0 lb

## 2020-03-19 DIAGNOSIS — G44209 Tension-type headache, unspecified, not intractable: Secondary | ICD-10-CM

## 2020-03-19 MED ORDER — CYCLOBENZAPRINE HCL 5 MG PO TABS: 5.0000 mg | ORAL_TABLET | Freq: Every day | ORAL | Status: DC

## 2020-03-19 MED ORDER — CYCLOBENZAPRINE HCL 5 MG PO TABS: 5.0000 mg | ORAL_TABLET | Freq: Three times a day (TID) | ORAL | 0 refills | Status: DC | PRN

## 2020-03-19 MED ORDER — IBUPROFEN 800 MG PO TABS: 800.0000 mg | ORAL_TABLET | Freq: Three times a day (TID) | ORAL | 0 refills | Status: DC | PRN

## 2020-03-19 NOTE — Progress Notes (Addendum)
Agustina Owusu, is a 35 y.o. female  [id number]  FIE:[id number]  DOB - 1985-10-02  CC:  Chief Complaint  Patient presents with  . Ear Pain  . Headache       HPI:  SUBJECTIVE: Saadia L Fedewa is a 35 y.o. female who complains of headaches off and on  For approximately 2 weeks week(s). Description of pain: sharp pain, unilateral in the left cervical neck to L ear  area. Duration of individual headaches:  minute(s), frequency intermittently. Associated symptoms: none other than radiation to [facility] . Pain relief: OTC NSAID's and sleep. Precipitating factors: patient is aware of none. She denies a history of recent head injury or L ear injury/trauma Prior neurological history: negative for no neurological problems. Neurologic Review of Systems - no TIA or stroke-like symptoms.  Scheduled Meds: Continuous Infusions: PRN Meds:       Past Medical History:  Diagnosis Date  . Abnormal Pap smear    colpo, ok since  . Anemia    low iron  . Cesarean delivery delivered 02/27/2013  . Chlamydia   . Hypertension   . Leg pain   . Lower back pain   . Ovarian cyst       No Known Allergies  Current Outpatient Medications on File Prior to Visit  Medication Sig Dispense Refill  . hydrochlorothiazide (HYDRODIURIL) 25 MG tablet Take 1 tablet (25 mg total) by mouth daily. 90 tablet 3   Current Facility-Administered Medications on File Prior to Visit  Medication Dose Route Frequency Provider Last Rate Last Admin  . lactated ringers infusion   Intravenous Continuous PRN Michele Rockers, CRNA   New Bag at 07/30/19 1302   Family History  Problem Relation Age of Onset  . Hypertension Mother   . Hypertension Father   . Diabetes Paternal Aunt   . Hypertension Maternal Grandmother   . Cancer Maternal Grandmother   . Hypertension Maternal Grandfather   . Hypertension Paternal Grandmother   . Hypertension Paternal Grandfather   . Mental retardation Maternal Uncle   . Other Neg Hx     Social History   Socioeconomic History  . Marital status: Single    Spouse name: Not on file  . Number of children: Not on file  . Years of education: Not on file  . Highest education level: Not on file  Occupational History  . Not on file  Tobacco Use  . Smoking status: Former Smoker    Packs/day: 1.00    Years: 4.00    Pack years: 4.00    Quit date: 08/20/2019    Years since quitting: 0.5  . Smokeless tobacco: Never Used  . Tobacco comment: about 1-2 black and mild   Vaping Use  . Vaping Use: Never used  Substance and Sexual Activity  . Alcohol use: Yes    Alcohol/week: 2.0 standard drinks    Types: 2 Glasses of wine per week    Comment: rarely  . Drug use: No  . Sexual activity: Yes    Birth control/protection: Surgical  Other Topics Concern  . Not on file  Social History Narrative  . Not on file   Social Determinants of Health   Financial Resource Strain: Not on file  Food Insecurity: Not on file  Transportation Needs: Not on file  Physical Activity: Not on file  Stress: Not on file  Social Connections: Not on file  Intimate Partner Violence: Not on file    Review of Systems: Constitutional: Negative for fever, chills,  diaphoresis, activity change, appetite change and fatigue. HENT: Negative for ear discharge, hearing loss , nosebleeds, congestion, facial swelling, rhinorrhea, neck pain, neck stiffness and ear discharge.  Eyes: Negative for pain, discharge, redness, itching and visual disturbance. Respiratory: Negative for cough, choking, chest tightness, shortness of breath, wheezing and stridor.  Musculoskeletal: Negative for back pain, joint swelling, arthralgia and gait problem. Neurological: Negative for dizziness, tremors, seizures, syncope, facial asymmetry, speech difficulty, weakness, light-headedness, numbness  Psychiatric/Behavioral: Negative for hallucinations, behavioral problems, confusion, dysphoric mood, decreased concentration and  agitation.    Objective:   Vitals:   03/19/20 1507  BP: (!) 139/101  Rechecked manually  135/85  Pulse: 75  SpO2: 100%    Physical Exam: Constitutional: Patient appears well-developed and well-nourished. No distress. HENT: Normocephalic, atraumatic, External right and left ear normal. Oropharynx is clear and moist.  Eyes: Conjunctivae and EOM are normal. PERRLA, no scleral icterus. Fundi Flat Neck: Normal ROM. Neck supple. No JVD. No tracheal deviation. No thyromegaly. CVS: RRR, S1/S2 +, no murmurs, no gallops, no carotid bruit.  Pulmonary: Effort and breath sounds normal, no stridor, rhonchi, wheezes, rales.  Musculoskeletal: Normal range of motion. No edema and no tenderness.  Cervical spine spasm L paracervical area no ttp c spine  No TMJ sxs noted on exam Lymphadenopathy: No lymphadenopathy noted, cervical, inguinal or axillary Neuro: Alert. Normal reflexes, muscle tone coordination. No cranial nerve deficit. Skin: Skin is warm and dry. No rash noted. Not diaphoretic. No erythema. No pallor. Psychiatric: Normal mood and affect. Behavior, judgment, thought content normal.    Lab Results  Component Value Date   WBC 7.9 12/17/2019   HGB 12.3 12/17/2019   HCT 39.6 12/17/2019   MCV 82.2 12/17/2019   PLT 301 12/17/2019   Lab Results  Component Value Date   CREATININE 0.82 12/17/2019   BUN 14 12/17/2019   NA 136 12/17/2019   K 3.5 12/17/2019   CL 101 12/17/2019   CO2 24 12/17/2019    No results found for: HGBA1C Lipid Panel  No results found for: CHOL, TRIG, HDL, CHOLHDL, VLDL, LDLCALC      Assessment and plan:   1. Muscle tension headache Dw pt at length etiology  - Ibuprofen (ADVIL) 800 MG tablet; Take 1 tablet (800 mg total) by mouth every 8 (eight) hours as needed.  Dispense: 30 tablet; Refill: 0 Take with food  Flexeril 5mg  1 qhs x 3 days #9  dw pt side effects including drowsiness  - Ambulatory referral to Physical Therapy  PLAN: - Cervical Pillow -  Heat Dry 5-10 minutes tid prn  - Dw pt no need for imaging at this time  Recommendations: side effect profile discussed in detail and patient reassured that neurodiagnostic workup not indicated from benign H & P. See orders for this visit as documented in the electronic medical record.     No follow-ups on file.  The patient was given clear instructions to go to ER or return to medical center if symptoms don't improve, worsen or new problems develop. The patient verbalized understanding. The patient was told to call to get lab results if they haven't heard anything in the next week.     This note has been created with Surveyor, quantity. Any transcriptional errors are unintentional.    Dereck Madaline Guthrie, MD,  [facility] [place], [place]   03/19/2020, 3:49 PM

## 2020-03-19 NOTE — Progress Notes (Signed)
Has pain that radiates form left side of neck through ear and up to head.

## 2020-03-24 DIAGNOSIS — Z419 Encounter for procedure for purposes other than remedying health state, unspecified: Secondary | ICD-10-CM | POA: Diagnosis not present

## 2020-04-10 ENCOUNTER — Ambulatory Visit: Payer: Managed Care, Other (non HMO) | Attending: Family Medicine | Admitting: Physical Therapy

## 2020-04-21 DIAGNOSIS — Z419 Encounter for procedure for purposes other than remedying health state, unspecified: Secondary | ICD-10-CM | POA: Diagnosis not present

## 2020-04-24 ENCOUNTER — Ambulatory Visit: Payer: Managed Care, Other (non HMO) | Admitting: Nurse Practitioner

## 2020-05-22 DIAGNOSIS — Z419 Encounter for procedure for purposes other than remedying health state, unspecified: Secondary | ICD-10-CM | POA: Diagnosis not present

## 2020-05-25 ENCOUNTER — Ambulatory Visit: Payer: Managed Care, Other (non HMO) | Admitting: Nurse Practitioner

## 2020-06-21 DIAGNOSIS — Z419 Encounter for procedure for purposes other than remedying health state, unspecified: Secondary | ICD-10-CM | POA: Diagnosis not present

## 2020-06-21 DIAGNOSIS — H5213 Myopia, bilateral: Secondary | ICD-10-CM | POA: Diagnosis not present

## 2020-07-22 DIAGNOSIS — Z419 Encounter for procedure for purposes other than remedying health state, unspecified: Secondary | ICD-10-CM | POA: Diagnosis not present

## 2020-08-21 DIAGNOSIS — Z419 Encounter for procedure for purposes other than remedying health state, unspecified: Secondary | ICD-10-CM | POA: Diagnosis not present

## 2020-09-19 ENCOUNTER — Encounter (HOSPITAL_COMMUNITY): Payer: Self-pay | Admitting: Emergency Medicine

## 2020-09-19 ENCOUNTER — Emergency Department (HOSPITAL_COMMUNITY)
Admission: EM | Admit: 2020-09-19 | Discharge: 2020-09-19 | Disposition: A | Discharge status: Home / Self Care | Payer: Managed Care, Other (non HMO) | Attending: Emergency Medicine | Admitting: Emergency Medicine

## 2020-09-19 ENCOUNTER — Other Ambulatory Visit: Payer: Self-pay

## 2020-09-19 DIAGNOSIS — N898 Other specified noninflammatory disorders of vagina: Secondary | ICD-10-CM | POA: Diagnosis not present

## 2020-09-19 DIAGNOSIS — Z79899 Other long term (current) drug therapy: Secondary | ICD-10-CM | POA: Insufficient documentation

## 2020-09-19 DIAGNOSIS — R102 Pelvic and perineal pain: Secondary | ICD-10-CM | POA: Insufficient documentation

## 2020-09-19 DIAGNOSIS — Z87891 Personal history of nicotine dependence: Secondary | ICD-10-CM | POA: Diagnosis not present

## 2020-09-19 DIAGNOSIS — I10 Essential (primary) hypertension: Secondary | ICD-10-CM | POA: Insufficient documentation

## 2020-09-19 DIAGNOSIS — R109 Unspecified abdominal pain: Secondary | ICD-10-CM | POA: Diagnosis not present

## 2020-09-19 DIAGNOSIS — Z202 Contact with and (suspected) exposure to infections with a predominantly sexual mode of transmission: Secondary | ICD-10-CM | POA: Diagnosis present

## 2020-09-19 LAB — COMPREHENSIVE METABOLIC PANEL
ALT: 15 U/L (ref 0–44)
AST: 26 U/L (ref 15–41)
Albumin: 3.6 g/dL (ref 3.5–5.0)
Alkaline Phosphatase: 49 U/L (ref 38–126)
Anion gap: 8 (ref 5–15)
BUN: 10 mg/dL (ref 6–20)
CO2: 21 mmol/L — ABNORMAL LOW (ref 22–32)
Calcium: 8.6 mg/dL — ABNORMAL LOW (ref 8.9–10.3)
Chloride: 105 mmol/L (ref 98–111)
Creatinine, Ser: 0.87 mg/dL (ref 0.44–1.00)
GFR, Estimated: >60 mL/min (ref >60)
Glucose, Bld: 94 mg/dL (ref 70–99)
Potassium: 4.1 mmol/L (ref 3.5–5.1)
Sodium: 134 mmol/L — ABNORMAL LOW (ref 135–145)
Total Bilirubin: 0.4 mg/dL (ref 0.3–1.2)
Total Protein: 6.7 g/dL (ref 6.5–8.1)

## 2020-09-19 LAB — CBC WITH DIFFERENTIAL/PLATELET
Abs Immature Granulocytes: 0.02 10*3/uL (ref 0.00–0.07)
Basophils Absolute: 0 10*3/uL (ref 0.0–0.1)
Basophils Relative: 1 %
Eosinophils Absolute: 0.2 10*3/uL (ref 0.0–0.5)
Eosinophils Relative: 4 %
HCT: 37.1 % (ref 36.0–46.0)
Hemoglobin: 11.4 g/dL — ABNORMAL LOW (ref 12.0–15.0)
Immature Granulocytes: 0 %
Lymphocytes Relative: 17 %
Lymphs Abs: 1 10*3/uL (ref 0.7–4.0)
MCH: 25.8 pg — ABNORMAL LOW (ref 26.0–34.0)
MCHC: 30.7 g/dL (ref 30.0–36.0)
MCV: 83.9 fL (ref 80.0–100.0)
Monocytes Absolute: 0.9 10*3/uL (ref 0.1–1.0)
Monocytes Relative: 15 %
Neutro Abs: 3.9 10*3/uL (ref 1.7–7.7)
Neutrophils Relative %: 63 %
Platelets: 258 10*3/uL (ref 150–400)
RBC: 4.42 MIL/uL (ref 3.87–5.11)
RDW: 15.6 % — ABNORMAL HIGH (ref 11.5–15.5)
WBC: 6.2 10*3/uL (ref 4.0–10.5)
nRBC: 0 % (ref 0.0–0.2)

## 2020-09-19 LAB — WET PREP, GENITAL
Clue Cells Wet Prep HPF POC: NONE SEEN
Sperm: NONE SEEN
Trich, Wet Prep: NONE SEEN
Yeast Wet Prep HPF POC: NONE SEEN

## 2020-09-19 LAB — LIPASE, BLOOD: Lipase: 34 U/L (ref 11–51)

## 2020-09-19 LAB — HIV ANTIBODY (ROUTINE TESTING W REFLEX): HIV Screen 4th Generation wRfx: NONREACTIVE

## 2020-09-19 LAB — I-STAT BETA HCG BLOOD, ED (MC, WL, AP ONLY): I-stat hCG, quantitative: <5 m[IU]/mL (ref <5)

## 2020-09-19 MED ORDER — CEFTRIAXONE SODIUM 500 MG IJ SOLR
500.0000 mg | Freq: Once | INTRAMUSCULAR | Status: AC
  Administered 2020-09-19: 500 mg via INTRAMUSCULAR
  Filled 2020-09-19: qty 500

## 2020-09-19 MED ORDER — DOXYCYCLINE HYCLATE 100 MG PO CAPS: 100.0000 mg | ORAL_CAPSULE | Freq: Two times a day (BID) | ORAL | 0 refills | Status: DC

## 2020-09-19 MED ORDER — AZITHROMYCIN 250 MG PO TABS
1000.0000 mg | ORAL_TABLET | Freq: Once | ORAL | Status: AC
  Administered 2020-09-19: 1000 mg via ORAL
  Filled 2020-09-19: qty 4

## 2020-09-19 MED ORDER — LIDOCAINE HCL (PF) 1 % IJ SOLN
1.0000 mL | Freq: Once | INTRAMUSCULAR | Status: AC
  Administered 2020-09-19: 1 mL
  Filled 2020-09-19: qty 5

## 2020-09-19 NOTE — ED Provider Notes (Signed)
Emergency Medicine Provider Triage Evaluation Note  [name] , a 35 y.o. female  was evaluated in triage.  Pt complains of lower abd cramping. Began 3 days ago. Located diffusely to lower abdomen. No back pain. No urinary complaints. No vag dc. Partner did test positive for gonorrhea. No emesis, diarrhea. Had multiple cysts removed previous. Denies chance of preg  Review of Systems  Positive: Lower abd cramping Negative: Vag d/c, dysuria, constipation, diarrhea  Physical Exam  BP (!) 159/117 (BP Location: Right Arm)   Pulse 96   Temp 98.9 F (37.2 C) (Oral)   Resp 16   LMP 08/30/2020   SpO2 100%  Gen:   Awake, no distress   Resp:  Normal effort  MSK:   Moves extremities without difficulty  ABD:  Soft, mild diffuse lower tenderness, no focal pain Other:    Medical Decision Making  Medically screening exam initiated at 12:28 PM.  Appropriate orders placed.  Allyanna L Spahr was informed that the remainder of the evaluation will be completed by another provider, this initial triage assessment does not replace that evaluation, and the importance of remaining in the ED until their evaluation is complete.  Abd cramping  VS stable, work up started   [place], Silas Flood, PA-C 09/19/20 1229    Wyvonnia Dusky, MD 09/19/20 1435

## 2020-09-19 NOTE — ED Provider Notes (Signed)
[facility] EMERGENCY DEPARTMENT Provider Note   CSN: [id number] Arrival date & time: 09/19/20  1220     History Chief Complaint  Patient presents with   Abdominal Pain    [name] is a 35 y.o. female.  Patient with a complaint of some pelvic pain for a week.  Says sexual partner has gonorrhea.  Denies any discharge nausea or vomiting.  No fevers.  No back pain.      Past Medical History:  Diagnosis Date   Abnormal Pap smear    colpo, ok since   Anemia    low iron   Cesarean delivery delivered 02/27/2013   Chlamydia    Headache(784.0)    OTC meds prn   Hypertension    Leg pain    Lower back pain    Ovarian cyst     Patient Active Problem List   Diagnosis Date Noted   Post-operative state 12/17/2019   Hypertension 08/21/2019   Cyst of left ovary 03/27/2019   Hx of ovarian cyst 07/21/2012   Previous cesarean delivery, antepartum condition or complication x 2 0000000   Smoker 07/21/2012    Past Surgical History:  Procedure Laterality Date   CESAREAN SECTION     CESAREAN SECTION N/A 02/27/2013   Procedure: CESAREAN SECTION with tubal ligation;  Surgeon: Betsy Coder, MD;  Location: [facility];  Service: Obstetrics;  Laterality: N/A;   CESAREAN SECTION     DIAGNOSTIC LAPAROSCOPY  2007   DILATION AND EVACUATION  10/27/2011   Procedure: DILATATION AND EVACUATION;  Surgeon: Betsy Coder, MD;  Location: [facility];  Service: Gynecology;  Laterality: N/A;   DILATION AND EVACUATION  12/04/2011   Procedure: DILATATION AND EVACUATION;  Surgeon: Betsy Coder, MD;  Location: [facility];  Service: Gynecology;  Laterality: N/A;   EXPLORATORY LAPAROTOMY  12/17/2019   EXPLORATORY LAPAROTOMY WITH LEFT OVARIAN CYSTECTOMY (Left Abdomen)   LAPAROTOMY Left 12/17/2019   Procedure: EXPLORATORY LAPAROTOMY WITH LEFT OVARIAN CYSTECTOMY;  Surgeon: Chancy Milroy, MD;  Location: [facility];  Service: Gynecology;  Laterality: Left;   oophrectomy  2007   Right , d/t  cyst   TUBAL LIGATION       OB History     Gravida  5   Para  4   Term  3   Preterm  1   AB  1   Living  4      SAB  1   IAB  0   Ectopic  0   Multiple  0   Live Births  4           Family History  Problem Relation Age of Onset   Hypertension Mother    Hypertension Father    Diabetes Paternal Aunt    Hypertension Maternal Grandmother    Cancer Maternal Grandmother    Hypertension Maternal Grandfather    Hypertension Paternal Grandmother    Hypertension Paternal Grandfather    Mental retardation Maternal Uncle    Other Neg Hx     Social History   Tobacco Use   Smoking status: Former    Packs/day: 1.00    Years: 4.00    Pack years: 4.00    Types: Cigarettes    Quit date: 08/20/2019    Years since quitting: 1.0   Smokeless tobacco: Never   Tobacco comments:    about 1-2 black and mild   Vaping Use   Vaping Use: Never used  Substance Use Topics  Alcohol use: Yes    Alcohol/week: 2.0 standard drinks    Types: 2 Glasses of wine per week    Comment: rarely   Drug use: No    Home Medications Prior to Admission medications   Medication Sig Start Date End Date Taking? Authorizing Provider  doxycycline (VIBRAMYCIN) 100 MG capsule Take 1 capsule (100 mg total) by mouth 2 (two) times daily. 09/19/20  Yes Fredia Sorrow, MD  cyclobenzaprine (FLEXERIL) 5 MG tablet Take 1 tablet (5 mg total) by mouth 3 (three) times daily as needed for muscle spasms. 03/19/20   Deleon, [name], MD  hydrochlorothiazide (HYDRODIURIL) 25 MG tablet Take 1 tablet (25 mg total) by mouth daily. 01/22/20   Gildardo Pounds, NP  ibuprofen (ADVIL) 800 MG tablet Take 1 tablet (800 mg total) by mouth every 8 (eight) hours as needed. 03/19/20   Deleon, Anastasio Champion, MD    Allergies    Patient has no known allergies.  Review of Systems   Review of Systems  Constitutional:  Negative for chills and fever.  HENT:  Negative for congestion, ear pain and sore throat.   Eyes:  Negative  for pain and visual disturbance.  Respiratory:  Negative for cough and shortness of breath.   Cardiovascular:  Negative for chest pain and palpitations.  Gastrointestinal:  Negative for abdominal pain and vomiting.  Genitourinary:  Positive for pelvic pain. Negative for dysuria, hematuria, vaginal bleeding and vaginal discharge.  Musculoskeletal:  Negative for arthralgias and back pain.  Skin:  Negative for color change and rash.  Neurological:  Negative for seizures and syncope.  All other systems reviewed and are negative.  Physical Exam Updated Vital Signs BP (!) 159/117 (BP Location: Right Arm)   Pulse 96   Temp 98.9 F (37.2 C) (Oral)   Resp 16   LMP 08/30/2020   SpO2 100%   Physical Exam Vitals and nursing note reviewed.  Constitutional:      General: She is not in acute distress.    Appearance: Normal appearance. She is well-developed.  HENT:     Head: Normocephalic and atraumatic.  Eyes:     Conjunctiva/sclera: Conjunctivae normal.  Cardiovascular:     Rate and Rhythm: Normal rate and regular rhythm.     Heart sounds: No murmur heard. Pulmonary:     Effort: Pulmonary effort is normal. No respiratory distress.     Breath sounds: Normal breath sounds.  Abdominal:     General: There is no distension.     Palpations: Abdomen is soft.     Tenderness: There is no abdominal tenderness.  Genitourinary:    General: Normal vulva.     Vagina: Vaginal discharge present.     Comments: External genitalia normal.  Vaginal vault with whitish-yellow discharge.  Mild cervical motion tenderness.  Mild uterine tenderness no adnexal tenderness. Musculoskeletal:        General: No swelling.     Cervical back: Normal range of motion and neck supple.  Skin:    General: Skin is warm and dry.  Neurological:     General: No focal deficit present.     Mental Status: She is alert and oriented to person, place, and time.    ED Results / Procedures / Treatments   Labs (all labs  ordered are listed, but only abnormal results are displayed) Labs Reviewed  WET PREP, GENITAL - Abnormal; Notable for the following components:      Result Value   WBC, Wet Prep HPF POC  MANY (*)    All other components within normal limits  CBC WITH DIFFERENTIAL/PLATELET - Abnormal; Notable for the following components:   Hemoglobin 11.4 (*)    MCH 25.8 (*)    RDW 15.6 (*)    All other components within normal limits  COMPREHENSIVE METABOLIC PANEL - Abnormal; Notable for the following components:   Sodium 134 (*)    CO2 21 (*)    Calcium 8.6 (*)    All other components within normal limits  LIPASE, BLOOD  HIV ANTIBODY (ROUTINE TESTING W REFLEX)  URINALYSIS, ROUTINE W REFLEX MICROSCOPIC  RPR  I-STAT BETA HCG BLOOD, ED ([facility], [facility], [facility] ONLY)  GC/CHLAMYDIA PROBE AMP ([place]) NOT AT [facility]    EKG None  Radiology No results found.  Procedures Procedures   Medications Ordered in ED Medications  cefTRIAXone (ROCEPHIN) injection 500 mg (500 mg Intramuscular Given 09/19/20 1707)  lidocaine (PF) (XYLOCAINE) 1 % injection 1 mL (1 mL Other Given 09/19/20 1706)  azithromycin (ZITHROMAX) tablet 1,000 mg (1,000 mg Oral Given 09/19/20 1707)    ED Course  I have reviewed the triage vital signs and the nursing notes.  Pertinent labs & imaging results that were available during my care of the patient were reviewed by me and considered in my medical decision making (see chart for details).    MDM Rules/Calculators/A&P                           Patient with STD exposure.  Patient with pelvic discomfort for a week.  There is a discharge.  Exam not consistent with pelvic inflammatory disease.  Wet prep pending.  Cultures pending.  Patient's labs without any significant abnormalities.  Pregnancy test negative.  Wet prep without any significant findings.  Other than white blood cells.  Will clinically treat for STD.   Final Clinical Impression(s) / ED Diagnoses Final diagnoses:  STD  exposure    Rx / DC Orders ED Discharge Orders          Ordered    doxycycline (VIBRAMYCIN) 100 MG capsule  2 times daily        09/19/20 1716             Fredia Sorrow, MD 09/19/20 1717

## 2020-09-19 NOTE — Discharge Instructions (Addendum)
Pelvic cultures are pending.  Wet prep without any significant abnormalities.  Antibiotics provided here.  If you do not have any improvement over the next few days.  Return return for any new or worse symptoms.  Take the additional antibiotic doxycycline as directed for the next 7 days.

## 2020-09-19 NOTE — ED Triage Notes (Signed)
C/o lower abd pain x 2 days.  Denies nausea, vomiting, diarrhea, constipation, and urinary complaints.  Told yesterday that sexual partner + for gonorrhea.

## 2020-09-20 LAB — RPR: RPR Ser Ql: NONREACTIVE

## 2020-09-21 DIAGNOSIS — Z419 Encounter for procedure for purposes other than remedying health state, unspecified: Secondary | ICD-10-CM | POA: Diagnosis not present

## 2020-09-21 LAB — GC/CHLAMYDIA PROBE AMP (~~LOC~~) NOT AT ARMC
Chlamydia: POSITIVE — AB
Comment: NEGATIVE
Comment: NORMAL
Neisseria Gonorrhea: POSITIVE — AB

## 2020-10-22 DIAGNOSIS — Z419 Encounter for procedure for purposes other than remedying health state, unspecified: Secondary | ICD-10-CM | POA: Diagnosis not present

## 2020-11-21 DIAGNOSIS — Z419 Encounter for procedure for purposes other than remedying health state, unspecified: Secondary | ICD-10-CM | POA: Diagnosis not present

## 2020-12-21 ENCOUNTER — Telehealth: Payer: Self-pay | Admitting: Nurse Practitioner

## 2020-12-21 NOTE — Telephone Encounter (Signed)
Provider has 7-10 business days to fill out paperwork. Will call when ready to be picked up.

## 2020-12-21 NOTE — Telephone Encounter (Signed)
Pt came to drop off Medical Clearance for Dental Treatment form.Routed to [employer]

## 2020-12-22 DIAGNOSIS — Z419 Encounter for procedure for purposes other than remedying health state, unspecified: Secondary | ICD-10-CM | POA: Diagnosis not present

## 2021-01-01 ENCOUNTER — Encounter: Payer: Self-pay | Admitting: Nurse Practitioner

## 2021-01-01 ENCOUNTER — Other Ambulatory Visit: Payer: Self-pay

## 2021-01-01 ENCOUNTER — Ambulatory Visit: Payer: Managed Care, Other (non HMO) | Attending: Nurse Practitioner | Admitting: Nurse Practitioner

## 2021-01-01 VITALS — BP 148/101 | HR 79 | Ht 63.0 in | Wt 155.0 lb

## 2021-01-01 DIAGNOSIS — I10 Essential (primary) hypertension: Secondary | ICD-10-CM | POA: Diagnosis not present

## 2021-01-01 MED ORDER — VALSARTAN-HYDROCHLOROTHIAZIDE 80-12.5 MG PO TABS: 1.0000 | ORAL_TABLET | Freq: Every day | ORAL | 3 refills | Status: DC

## 2021-01-01 NOTE — Progress Notes (Signed)
Assessment & Plan:  There are no diagnoses linked to this encounter.  Patient has been counseled on age-appropriate routine health concerns for screening and prevention. These are reviewed and up-to-date. Referrals have been placed accordingly. Immunizations are up-to-date or declined.    Subjective:  No chief complaint on file.  HPI [name] 35 y.o. female presents to office today for follow-up to hypertension and for completion of paperwork from her dental provider.  I was unable to complete her dental paperwork due to poorly controlled hypertension today.  Once blood pressure is under control we will be able to complete her dental forms and fax back to her dentist.   HTN She states she has been out of hydrochlorothiazide 25 mg for few months however her blood pressure has been poorly controlled over the past year.  We will switch to Diovan HCTZ 80-12.5 mg today.  She has significant family history of hypertension in both mother and father. BP Readings from Last 3 Encounters:  01/01/21 (!) 148/101  09/19/20 (!) 149/100  03/19/20 (!) 139/101     Review of Systems  Constitutional:  Negative for fever, malaise/fatigue and weight loss.  HENT: Negative.  Negative for nosebleeds.   Eyes: Negative.  Negative for blurred vision, double vision and photophobia.  Respiratory: Negative.  Negative for cough and shortness of breath.   Cardiovascular: Negative.  Negative for chest pain, palpitations and leg swelling.  Gastrointestinal: Negative.  Negative for heartburn, nausea and vomiting.  Musculoskeletal: Negative.  Negative for myalgias.  Neurological: Negative.  Negative for dizziness, focal weakness, seizures and headaches.  Psychiatric/Behavioral: Negative.  Negative for suicidal ideas.    Past Medical History:  Diagnosis Date   Abnormal Pap smear    colpo, ok since   Anemia    low iron   Cesarean delivery delivered 02/27/2013   Chlamydia    Headache(784.0)    OTC meds prn    Hypertension    Leg pain    Lower back pain    Ovarian cyst     Past Surgical History:  Procedure Laterality Date   CESAREAN SECTION     CESAREAN SECTION N/A 02/27/2013   Procedure: CESAREAN SECTION with tubal ligation;  Surgeon: Betsy Coder, MD;  Location: [facility];  Service: Obstetrics;  Laterality: N/A;   CESAREAN SECTION     DIAGNOSTIC LAPAROSCOPY  2007   DILATION AND EVACUATION  10/27/2011   Procedure: DILATATION AND EVACUATION;  Surgeon: Betsy Coder, MD;  Location: [facility];  Service: Gynecology;  Laterality: N/A;   DILATION AND EVACUATION  12/04/2011   Procedure: DILATATION AND EVACUATION;  Surgeon: Betsy Coder, MD;  Location: [facility];  Service: Gynecology;  Laterality: N/A;   EXPLORATORY LAPAROTOMY  12/17/2019   EXPLORATORY LAPAROTOMY WITH LEFT OVARIAN CYSTECTOMY (Left Abdomen)   LAPAROTOMY Left 12/17/2019   Procedure: EXPLORATORY LAPAROTOMY WITH LEFT OVARIAN CYSTECTOMY;  Surgeon: Chancy Milroy, MD;  Location: [facility];  Service: Gynecology;  Laterality: Left;   oophrectomy  2007   Right , d/t cyst   TUBAL LIGATION      Family History  Problem Relation Age of Onset   Hypertension Mother    Hypertension Father    Diabetes Paternal Aunt    Hypertension Maternal Grandmother    Cancer Maternal Grandmother    Hypertension Maternal Grandfather    Hypertension Paternal Grandmother    Hypertension Paternal Grandfather    Mental retardation Maternal Uncle    Other Neg Hx  Social History Reviewed with no changes to be made today.   Outpatient Medications Prior to Visit  Medication Sig Dispense Refill   hydrochlorothiazide (HYDRODIURIL) 25 MG tablet Take 1 tablet (25 mg total) by mouth daily. 90 tablet 3   cyclobenzaprine (FLEXERIL) 5 MG tablet Take 1 tablet (5 mg total) by mouth 3 (three) times daily as needed for muscle spasms. (Patient not taking: Reported on 01/01/2021) 9 tablet 0   doxycycline (VIBRAMYCIN) 100 MG capsule Take 1 capsule (100 mg total) by mouth  2 (two) times daily. (Patient not taking: Reported on 01/01/2021) 14 capsule 0   ibuprofen (ADVIL) 800 MG tablet Take 1 tablet (800 mg total) by mouth every 8 (eight) hours as needed. (Patient not taking: Reported on 01/01/2021) 30 tablet 0   Facility-Administered Medications Prior to Visit  Medication Dose Route Frequency Provider Last Rate Last Admin   lactated ringers infusion   Intravenous Continuous PRN Michele Rockers, CRNA   New Bag at 07/30/19 1302    No Known Allergies     Objective:    BP (!) 148/101   Pulse 79   Ht 5\' 3"  (1.6 m)   Wt 155 lb (70.3 kg)   LMP 12/11/2020   SpO2 100%   BMI 27.46 kg/m  Wt Readings from Last 3 Encounters:  01/01/21 155 lb (70.3 kg)  03/19/20 146 lb (66.2 kg)  01/22/20 140 lb 12.8 oz (63.9 kg)    Physical Exam Vitals and nursing note reviewed.  Constitutional:      Appearance: She is well-developed.  HENT:     Head: Normocephalic and atraumatic.  Cardiovascular:     Rate and Rhythm: Normal rate and regular rhythm.     Heart sounds: Normal heart sounds. No murmur heard.   No friction rub. No gallop.  Pulmonary:     Effort: Pulmonary effort is normal. No tachypnea or respiratory distress.     Breath sounds: Normal breath sounds. No decreased breath sounds, wheezing, rhonchi or rales.  Chest:     Chest wall: No tenderness.  Abdominal:     General: Bowel sounds are normal.     Palpations: Abdomen is soft.  Musculoskeletal:        General: Normal range of motion.     Cervical back: Normal range of motion.  Skin:    General: Skin is warm and dry.  Neurological:     Mental Status: She is alert and oriented to person, place, and time.     Coordination: Coordination normal.  Psychiatric:        Behavior: Behavior normal. Behavior is cooperative.        Thought Content: Thought content normal.        Judgment: Judgment normal.         Patient has been counseled extensively about nutrition and exercise as well as the importance  of adherence with medications and regular follow-up. The patient was given clear instructions to go to ER or return to medical center if symptoms don't improve, worsen or new problems develop. The patient verbalized understanding.   Follow-up: No follow-ups on file.   Gildardo Pounds, FNP-BC [facility] and [facility], [place]   01/01/2021, 11:24 AM

## 2021-01-02 LAB — CMP14+EGFR
ALT: 12 IU/L (ref 0–32)
AST: 24 IU/L (ref 0–40)
Albumin/Globulin Ratio: 1.6 (ref 1.2–2.2)
Albumin: 4.4 g/dL (ref 3.8–4.8)
Alkaline Phosphatase: 71 IU/L (ref 44–121)
BUN/Creatinine Ratio: 20 (ref 9–23)
BUN: 14 mg/dL (ref 6–20)
Bilirubin Total: <0.2 mg/dL (ref 0.0–1.2)
CO2: 22 mmol/L (ref 20–29)
Calcium: 10.6 mg/dL — ABNORMAL HIGH (ref 8.7–10.2)
Chloride: 104 mmol/L (ref 96–106)
Creatinine, Ser: 0.71 mg/dL (ref 0.57–1.00)
Globulin, Total: 2.8 g/dL (ref 1.5–4.5)
Glucose: 87 mg/dL (ref 70–99)
Potassium: 4.9 mmol/L (ref 3.5–5.2)
Sodium: 137 mmol/L (ref 134–144)
Total Protein: 7.2 g/dL (ref 6.0–8.5)
eGFR: 114 mL/min/{1.73_m2} (ref >59)

## 2021-01-19 NOTE — Progress Notes (Unsigned)
   S:    Patient arrives in good spirits. Presents to the clinic for hypertension evaluation, counseling, and management. Patient was referred and last seen by Primary Care Provider [name] on 01/01/2021, where patient stated being out of HCTZ for several months. [name] started valsartan-hctz at that time.   Medication adherence *** .  Current BP Medications include:   -Valsartan-hctz 80-12.5 mg daily  Antihypertensives tried in the past include: hctz alone  Dietary habits include: *** Exercise habits include:*** Family / Social history:  -Former smoker -Mother and father have a history of hypertension  ASCVD risk factors include:***   O:   Home BP readings: ***  Last 3 Office BP readings: BP Readings from Last 3 Encounters:  01/01/21 (!) 148/101  09/19/20 (!) 149/100  03/19/20 (!) 139/101    BMET    Component Value Date/Time   NA 137 01/01/2021 1141   K 4.9 01/01/2021 1141   CL 104 01/01/2021 1141   CO2 22 01/01/2021 1141   GLUCOSE 87 01/01/2021 1141   GLUCOSE 94 09/19/2020 1234   BUN 14 01/01/2021 1141   CREATININE 0.71 01/01/2021 1141   CALCIUM 10.6 (H) 01/01/2021 1141   GFRNONAA >60 09/19/2020 1234   GFRAA >60 07/24/2019 1331    Renal function: CrCl cannot be calculated (Unknown ideal weight.).  Clinical ASCVD: {YES/NO:21197} The ASCVD Risk score (Arnett DK, et al., 2019) failed to calculate for the following reasons:   The 2019 ASCVD risk score is only valid for ages 71 to 29  PHQ-9 Score: ***   A/P: Hypertension diagnosed *** currently *** on current medications. BP Goal = < *** mmHg. Medication adherence ***.  -{Meds adjust:18428} ***.  -F/u labs ordered - *** -Counseled on lifestyle modifications for blood pressure control including reduced dietary sodium, increased exercise, adequate sleep.  Results reviewed and written information provided.   Total time in face-to-face counseling *** minutes.   F/U Clinic Visit in ***.  Patient seen  with ***

## 2021-01-20 ENCOUNTER — Ambulatory Visit: Payer: Managed Care, Other (non HMO) | Admitting: Pharmacist

## 2021-01-21 DIAGNOSIS — Z419 Encounter for procedure for purposes other than remedying health state, unspecified: Secondary | ICD-10-CM | POA: Diagnosis not present

## 2021-01-26 ENCOUNTER — Other Ambulatory Visit: Payer: Self-pay

## 2021-01-26 ENCOUNTER — Emergency Department (HOSPITAL_COMMUNITY): Payer: Managed Care, Other (non HMO)

## 2021-01-26 ENCOUNTER — Encounter (HOSPITAL_COMMUNITY): Payer: Self-pay

## 2021-01-26 ENCOUNTER — Emergency Department (HOSPITAL_COMMUNITY)
Admission: EM | Admit: 2021-01-26 | Discharge: 2021-01-26 | Disposition: A | Discharge status: Home / Self Care | Payer: Managed Care, Other (non HMO) | Attending: Emergency Medicine | Admitting: Emergency Medicine

## 2021-01-26 ENCOUNTER — Ambulatory Visit: Payer: Self-pay | Admitting: *Deleted

## 2021-01-26 DIAGNOSIS — Z5321 Procedure and treatment not carried out due to patient leaving prior to being seen by health care provider: Secondary | ICD-10-CM | POA: Insufficient documentation

## 2021-01-26 DIAGNOSIS — Y990 Civilian activity done for income or pay: Secondary | ICD-10-CM | POA: Insufficient documentation

## 2021-01-26 DIAGNOSIS — I10 Essential (primary) hypertension: Secondary | ICD-10-CM | POA: Insufficient documentation

## 2021-01-26 DIAGNOSIS — R42 Dizziness and giddiness: Secondary | ICD-10-CM | POA: Insufficient documentation

## 2021-01-26 LAB — URINALYSIS, ROUTINE W REFLEX MICROSCOPIC
Bacteria, UA: NONE SEEN
Bilirubin Urine: NEGATIVE
Glucose, UA: NEGATIVE mg/dL
Ketones, ur: NEGATIVE mg/dL
Leukocytes,Ua: NEGATIVE
Nitrite: NEGATIVE
Protein, ur: NEGATIVE mg/dL
Specific Gravity, Urine: 1.01 (ref 1.005–1.030)
pH: 6.5 (ref 5.0–8.0)

## 2021-01-26 LAB — BASIC METABOLIC PANEL
Anion gap: 9 (ref 5–15)
BUN: 14 mg/dL (ref 6–20)
CO2: 25 mmol/L (ref 22–32)
Calcium: 9.6 mg/dL (ref 8.9–10.3)
Chloride: 102 mmol/L (ref 98–111)
Creatinine, Ser: 0.53 mg/dL (ref 0.44–1.00)
GFR, Estimated: >60 mL/min (ref >60)
Glucose, Bld: 93 mg/dL (ref 70–99)
Potassium: 3.8 mmol/L (ref 3.5–5.1)
Sodium: 136 mmol/L (ref 135–145)

## 2021-01-26 LAB — CBC
HCT: 34.7 % — ABNORMAL LOW (ref 36.0–46.0)
Hemoglobin: 10.8 g/dL — ABNORMAL LOW (ref 12.0–15.0)
MCH: 24 pg — ABNORMAL LOW (ref 26.0–34.0)
MCHC: 31.1 g/dL (ref 30.0–36.0)
MCV: 77.1 fL — ABNORMAL LOW (ref 80.0–100.0)
Platelets: 312 10*3/uL (ref 150–400)
RBC: 4.5 MIL/uL (ref 3.87–5.11)
RDW: 16.7 % — ABNORMAL HIGH (ref 11.5–15.5)
WBC: 6.6 10*3/uL (ref 4.0–10.5)
nRBC: 0 % (ref 0.0–0.2)

## 2021-01-26 LAB — CBG MONITORING, ED: Glucose-Capillary: 87 mg/dL (ref 70–99)

## 2021-01-26 NOTE — Telephone Encounter (Signed)
Reason for Disposition  Sounds like a life-threatening emergency to the triager  Answer Assessment - Initial Assessment Questions 1. DESCRIPTION: "Describe your dizziness."     Felt heaviness in head and hot "flusstered"  2. LIGHTHEADED: "Do you feel lightheaded?" (e.g., somewhat faint, woozy, weak upon standing)     Felt faint , woozy  3. VERTIGO: "Do you feel like either you or the room is spinning or tilting?" (i.e. vertigo)     Room spinning . And sat up and started spinning again  4. SEVERITY: "How bad is it?"  "Do you feel like you are going to faint?" "Can you stand and walk?"   - MILD: Feels slightly dizzy, but walking normally.   - MODERATE: Feels unsteady when walking, but not falling; interferes with normal activities (e.g., school, work).   - SEVERE: Unable to walk without falling, or requires assistance to walk without falling; feels like passing out now.      Moderate able to walk but not normal walking  5. ONSET:  "When did the dizziness begin?"     About 900 this am  6. AGGRAVATING FACTORS: "Does anything make it worse?" (e.g., standing, change in head position)     Worse: sitting up  7. HEART RATE: "Can you tell me your heart rate?" "How many beats in 15 seconds?"  (Note: not all patients can do this)      Beating  8. CAUSE: "What do you think is causing the dizziness?"    Not sure  9. RECURRENT SYMPTOM: "Have you had dizziness before?" If Yes, ask: "When was the last time?" "What happened that time?"     No  10. OTHER SYMPTOMS: "Do you have any other symptoms?" (e.g., fever, chest pain, vomiting, diarrhea, bleeding)       Heaviness in head  11. PREGNANCY: "Is there any chance you are pregnant?" "When was your last menstrual period?"       No . LMP x 1 week ago  Protocols used: Dizziness - Lightheadedness-A-AH

## 2021-01-26 NOTE — Telephone Encounter (Signed)
C/o dizziness and feeling faint while at work suddenly around 9:00 am and had to leave work. Patient c/o heaviness in head and felt hot "flussterd" and dizzy. Felt like she was going to faint but did not . Cold sx / flu last week. Denies chest pain , difficulty breathing, no fever. Hx HTN .  C/ she can not keep her head up due to spinning, vertigo severe. Denies blurred vision, weakness N/T on either side of body. Took BP 169/108 did take medication last night . Alert and oriented and can walk but not normal due to severe dizziness. Recommended ED and patient sounded worse throughout call and NT called 911 due to sx. 911 arrived and patient able to verbalize understanding .

## 2021-01-26 NOTE — ED Triage Notes (Signed)
Pt BIB EMS from home. Pt endorses dizziness since 9am this morning that has become progressively worse. A&O x4. Denies any recent falls or injury.  BP 142/60 HR 64 RR 18 98% RA

## 2021-01-26 NOTE — ED Provider Notes (Signed)
Emergency Medicine Provider Triage Evaluation Note  [name] , a 35 y.o. female  was evaluated in triage.  Pt complains of dizziness. She states that she was at work this morning at about noon and then had sudden onset of dizziness and feeling like her head is heavy.  She denies any trauma.  She states that she has been taking her same blood pressure medication since November in the beginning of November and not missed any doses.  She states she was sick last week with a flu as well as a family member however states that she got better.  She denies cough or shortness of breath.  She states that she feels both like she is going to pass out like things are spinning and moving.  She does have a history of migraines per her report.  Review of Systems  Positive: Head feels heavy, light headed Negative: Syncope, chest pain, shob  Physical Exam  BP (!) 136/95   Pulse 83   Temp 98.1 F (36.7 C) (Oral)   Resp 18   SpO2 100%  Gen:   Awake, appears to feel unwell.  Resp:  Normal effort  MSK:   Moves extremities without difficulty  Other:  Normal speech.  Coordination is grossly intact.  No nystagmus.  Pupils equal round reactive to light.  Normal EOMs.  Medical Decision Making  Medically screening exam initiated at 2:24 PM.  Appropriate orders placed.  [name] was informed that the remainder of the evaluation will be completed by another provider, this initial triage assessment does not replace that evaluation, and the importance of remaining in the ED until their evaluation is complete.  Patient with dizziness.  She does have a history of HTN and on chart review when she called the nurse triage line today reported that her blood pressure was 169/108. Given this hypertension will obtain CT head noncontrast to evaluate for ICH.   Note: Portions of this report may have been transcribed using voice recognition software. Every effort was made to ensure accuracy; however, inadvertent  computerized transcription errors may be present    Lorin Glass, PA-C 01/26/21 [place], [place], DO 01/27/21 [phone]

## 2021-02-21 DIAGNOSIS — Z419 Encounter for procedure for purposes other than remedying health state, unspecified: Secondary | ICD-10-CM | POA: Diagnosis not present

## 2021-02-24 ENCOUNTER — Other Ambulatory Visit: Payer: Self-pay

## 2021-02-24 ENCOUNTER — Ambulatory Visit: Payer: Managed Care, Other (non HMO) | Attending: Physician Assistant | Admitting: Physician Assistant

## 2021-02-24 ENCOUNTER — Encounter: Payer: Self-pay | Admitting: Physician Assistant

## 2021-02-24 ENCOUNTER — Other Ambulatory Visit (HOSPITAL_COMMUNITY)
Admission: RE | Admit: 2021-02-24 | Discharge: 2021-02-24 | Disposition: A | Discharge status: Home / Self Care | Payer: Managed Care, Other (non HMO) | Source: Ambulatory Visit | Attending: Physician Assistant | Admitting: Physician Assistant

## 2021-02-24 VITALS — BP 131/89 | HR 85 | Resp 16 | Wt 156.8 lb

## 2021-02-24 DIAGNOSIS — I10 Essential (primary) hypertension: Secondary | ICD-10-CM | POA: Diagnosis not present

## 2021-02-24 DIAGNOSIS — D649 Anemia, unspecified: Secondary | ICD-10-CM

## 2021-02-24 DIAGNOSIS — N898 Other specified noninflammatory disorders of vagina: Secondary | ICD-10-CM | POA: Insufficient documentation

## 2021-02-24 DIAGNOSIS — Z09 Encounter for follow-up examination after completed treatment for conditions other than malignant neoplasm: Secondary | ICD-10-CM

## 2021-02-24 DIAGNOSIS — L0292 Furuncle, unspecified: Secondary | ICD-10-CM

## 2021-02-24 MED ORDER — MUPIROCIN 2 % EX OINT: 1.0000 "application " | TOPICAL_OINTMENT | Freq: Two times a day (BID) | CUTANEOUS | 0 refills | Status: DC

## 2021-02-24 NOTE — Progress Notes (Signed)
Patient states she is having some irritation in her private area and so sates she has cluster of bumps that come and go on her face, states that they are very painful.

## 2021-02-24 NOTE — Progress Notes (Signed)
Patient ID: AMBRY DIX, female   DOB: 08-30-1985, 36 y.o.   MRN: [id number]     [name], is a 36 y.o. female  [id number]:[id number]  [id number]  DOB - 1985/07/05  Chief Complaint  Patient presents with   Hypertension       Subjective:   [name] is a 36 y.o. female here today for a follow up visit After ED visit 01/26/2021 for dizziness.  CT head WNL.  Dizziness resolved.  Hgb was 10.8. periods are heavy.   Tender pimples at time on face or other areas on body at times.  No urinary s/sx.  She is having some vaginal itching.  No noticeable discharge.  No fever/pelvis pain.    From A/P: Patient with dizziness.  She does have a history of HTN and on chart review when she called the nurse triage line today reported that her blood pressure was 169/108. Given this hypertension will obtain CT head noncontrast to evaluate for ICH.    Marland Kitchen Patient has No headache, No chest pain, No abdominal pain - No Nausea, No new weakness tingling or numbness, No Cough - SOB.  No problems updated.  ALLERGIES: No Known Allergies  PAST MEDICAL HISTORY: Past Medical History:  Diagnosis Date   Abnormal Pap smear    colpo, ok since   Anemia    low iron   Cesarean delivery delivered 02/27/2013   Chlamydia    Headache(784.0)    OTC meds prn   Hypertension    Leg pain    Lower back pain    Ovarian cyst     MEDICATIONS AT HOME: Prior to Admission medications   Medication Sig Start Date End Date Taking? Authorizing Provider  mupirocin ointment (BACTROBAN) 2 % Apply 1 application topically 2 (two) times daily. Prn tender sores 02/24/21  Yes Shawna Kiener M, PA-C  valsartan-hydrochlorothiazide (DIOVAN-HCT) 80-12.5 MG tablet Take 1 tablet by mouth daily. 01/01/21  Yes Gildardo Pounds, NP    ROS: Neg HEENT Neg resp Neg cardiac Neg GI Neg MS Neg psych Neg neuro  Objective:   Vitals:   02/24/21 1344  BP: 131/89  Pulse: 85  Resp: 16  SpO2: 97%  Weight: 156 lb 12.8  oz (71.1 kg)   Exam General appearance : Awake, alert, not in any distress. Speech Clear. Not toxic looking HEENT: Atraumatic and Normocephalic Neck: Supple, no JVD. No cervical lymphadenopathy.  Chest: Good air entry bilaterally, CTAB.  No rales/rhonchi/wheezing CVS: S1 S2 regular, no murmurs.  Extremities: B/L Lower Ext shows no edema, both legs are warm to touch Neurology: Awake alert, and oriented X 3, CN II-XII intact, Non focal Skin: No Rash;  small tender pimple on side of face  Data Review No results found for: HGBA1C  Assessment & Plan   1. Essential hypertension At goal out of office-continue valsartan/hct.  She can call for RF for next 3-4 months  2. Encounter for examination following treatment at hospital Doing well.  Dizziness resolved  3. Anemia, unspecified type Heavy periods-will likely need iron - Iron, TIBC and Ferritin Panel  4. Vaginal irritation - Cervicovaginal ancillary only  5. Boil - mupirocin ointment (BACTROBAN) 2 %; Apply 1 application topically 2 (two) times daily. Prn tender sores  Dispense: 22 g; Refill: 0    Patient have been counseled extensively about nutrition and exercise. Other issues discussed during this visit include: low cholesterol diet, weight control and daily exercise, foot care, annual eye examinations at Ophthalmology, importance of adherence  with medications and regular follow-up. We also discussed long term complications of uncontrolled diabetes and hypertension.   Return in about 3 months (around 05/25/2021) for PCP-chronic conditions.  The patient was given clear instructions to go to ER or return to medical center if symptoms don't improve, worsen or new problems develop. The patient verbalized understanding. The patient was told to call to get lab results if they haven't heard anything in the next week.      Freeman Caldron, PA-C [facility] and [place], [place]   02/24/2021,  1:56 PM

## 2021-02-25 ENCOUNTER — Other Ambulatory Visit: Payer: Self-pay | Admitting: Physician Assistant

## 2021-02-25 DIAGNOSIS — D649 Anemia, unspecified: Secondary | ICD-10-CM

## 2021-02-25 LAB — CERVICOVAGINAL ANCILLARY ONLY
Bacterial Vaginitis (gardnerella): NEGATIVE
Candida Glabrata: NEGATIVE
Candida Vaginitis: NEGATIVE
Chlamydia: NEGATIVE
Comment: NEGATIVE
Comment: NEGATIVE
Comment: NEGATIVE
Comment: NEGATIVE
Comment: NEGATIVE
Comment: NORMAL
Neisseria Gonorrhea: NEGATIVE
Trichomonas: NEGATIVE

## 2021-02-25 LAB — IRON,TIBC AND FERRITIN PANEL
Ferritin: 14 ng/mL — ABNORMAL LOW (ref 15–150)
Iron Saturation: 6 % — CL (ref 15–55)
Iron: 23 ug/dL — ABNORMAL LOW (ref 27–159)
Total Iron Binding Capacity: 404 ug/dL (ref 250–450)
UIBC: 381 ug/dL (ref 131–425)

## 2021-02-25 MED ORDER — IRON (FERROUS SULFATE) 325 (65 FE) MG PO TABS: 325.0000 mg | ORAL_TABLET | Freq: Every day | ORAL | 1 refills | Status: DC

## 2021-03-24 DIAGNOSIS — Z419 Encounter for procedure for purposes other than remedying health state, unspecified: Secondary | ICD-10-CM | POA: Diagnosis not present

## 2021-04-05 ENCOUNTER — Ambulatory Visit: Payer: Managed Care, Other (non HMO) | Admitting: Nurse Practitioner

## 2021-04-21 DIAGNOSIS — Z419 Encounter for procedure for purposes other than remedying health state, unspecified: Secondary | ICD-10-CM | POA: Diagnosis not present

## 2021-05-22 DIAGNOSIS — Z419 Encounter for procedure for purposes other than remedying health state, unspecified: Secondary | ICD-10-CM | POA: Diagnosis not present

## 2021-05-26 ENCOUNTER — Encounter: Payer: Self-pay | Admitting: Nurse Practitioner

## 2021-05-26 ENCOUNTER — Ambulatory Visit: Payer: Managed Care, Other (non HMO) | Attending: Nurse Practitioner | Admitting: Nurse Practitioner

## 2021-05-26 VITALS — BP 125/84 | HR 75 | Temp 98.7°F | Resp 16 | Ht 63.0 in | Wt 155.0 lb

## 2021-05-26 DIAGNOSIS — M25512 Pain in left shoulder: Secondary | ICD-10-CM | POA: Diagnosis not present

## 2021-05-26 DIAGNOSIS — I10 Essential (primary) hypertension: Secondary | ICD-10-CM

## 2021-05-26 DIAGNOSIS — D649 Anemia, unspecified: Secondary | ICD-10-CM | POA: Diagnosis not present

## 2021-05-26 DIAGNOSIS — L700 Acne vulgaris: Secondary | ICD-10-CM

## 2021-05-26 DIAGNOSIS — D229 Melanocytic nevi, unspecified: Secondary | ICD-10-CM | POA: Diagnosis not present

## 2021-05-26 MED ORDER — VALSARTAN-HYDROCHLOROTHIAZIDE 80-12.5 MG PO TABS: 1.0000 | ORAL_TABLET | Freq: Every day | ORAL | 3 refills | Status: DC

## 2021-05-26 MED ORDER — IRON (FERROUS SULFATE) 325 (65 FE) MG PO TABS: 325.0000 mg | ORAL_TABLET | Freq: Every day | ORAL | 1 refills | Status: AC

## 2021-05-26 MED ORDER — METHOCARBAMOL 500 MG PO TABS: 500.0000 mg | ORAL_TABLET | Freq: Every evening | ORAL | 0 refills | Status: AC

## 2021-05-26 MED ORDER — DICLOFENAC SODIUM 1 % EX GEL: 2.0000 g | Freq: Four times a day (QID) | CUTANEOUS | 0 refills | Status: AC

## 2021-05-26 MED ORDER — CLINDAMYCIN PHOS-BENZOYL PEROX 1-5 % EX GEL
Freq: Two times a day (BID) | CUTANEOUS | 0 refills | Status: DC
Start: 2021-05-26 — End: 2022-05-13

## 2021-05-26 NOTE — Progress Notes (Signed)
? ?Assessment & Plan:  ?[name] was seen today for hypertension. ? ?Diagnoses and all orders for this visit: ? ?Primary hypertension ?-     valsartan-hydrochlorothiazide (DIOVAN-HCT) 80-12.5 MG tablet; Take 1 tablet by mouth daily. ?Continue all antihypertensives as prescribed.  ?Remember to bring in your blood pressure log with you for your follow up appointment.  ?DASH/Mediterranean Diets are healthier choices for HTN.   ? ?Anemia, unspecified type ?-     Iron, Ferrous Sulfate, 325 (65 Fe) MG TABS; Take 325 mg by mouth daily. ?-     Iron, TIBC and Ferritin Panel ?-     CBC ? ?Nevus ?-     Ambulatory referral to Dermatology ? ?Acute pain of left shoulder ?-     diclofenac Sodium (VOLTAREN) 1 % GEL; Apply 2 g topically 4 (four) times daily. ?-     methocarbamol (ROBAXIN) 500 MG tablet; Take 1 tablet (500 mg total) by mouth at bedtime. Left sided shoulder pain ? ?Acne vulgaris ?-     clindamycin-benzoyl peroxide (BENZACLIN) gel; Apply topically 2 (two) times daily. USE AS INSTRUCTED ? ? ? ?Patient has been counseled on age-appropriate routine health concerns for screening and prevention. These are reviewed and up-to-date. Referrals have been placed accordingly. Immunizations are up-to-date or declined.    ?Subjective:  ? ?Chief Complaint  ?Patient presents with  ? Hypertension  ? ?HPI ?[name] 36 y.o. female presents to office today for HTN. ?She has a past medical history of Abnormal Pap smear, Anemia,  Chlamydia,  Hypertension, Leg pain, Lower back pain, and Ovarian cyst.  ? ?HTN ?Blood pressure well controlled with diovan-hct 80-12.5 mg daily.  ?BP Readings from Last 3 Encounters:  ?05/26/21 125/84  ?02/24/21 131/89  ?01/26/21 (!) 132/100  ?  ?SKIN ?She has a mole in the back of her scalp in the right posterior scalp. The mole is nodular in appearance and has increased in size over time.  ? ?Left shoulder pain ?Unrelated to any specific injury or trauma. ROM is not limited at this time. Pain is worse  when she sleeps on her left side.  ? ? ? ?Review of Systems  ?Constitutional:  Negative for fever, malaise/fatigue and weight loss.  ?HENT: Negative.  Negative for nosebleeds.   ?Eyes: Negative.  Negative for blurred vision, double vision and photophobia.  ?Respiratory: Negative.  Negative for cough and shortness of breath.   ?Cardiovascular: Negative.  Negative for chest pain, palpitations and leg swelling.  ?Gastrointestinal: Negative.  Negative for heartburn, nausea and vomiting.  ?Musculoskeletal:  Positive for joint pain. Negative for myalgias.  ?Skin:   ?     SEE HPI  ?Neurological: Negative.  Negative for dizziness, focal weakness, seizures and headaches.  ?Psychiatric/Behavioral: Negative.  Negative for suicidal ideas.   ? ?Past Medical History:  ?Diagnosis Date  ? Abnormal Pap smear   ? colpo, ok since  ? Anemia   ? low iron  ? Cesarean delivery delivered 02/27/2013  ? Chlamydia   ? Headache(784.0)   ? OTC meds prn  ? Hypertension   ? Leg pain   ? Lower back pain   ? Ovarian cyst   ? ? ?Past Surgical History:  ?Procedure Laterality Date  ? CESAREAN SECTION    ? CESAREAN SECTION N/A 02/27/2013  ? Procedure: CESAREAN SECTION with tubal ligation;  Surgeon: Betsy Coder, MD;  Location: [facility];  Service: Obstetrics;  Laterality: N/A;  ? CESAREAN SECTION    ? DIAGNOSTIC LAPAROSCOPY  2007  ? DILATION AND EVACUATION  10/27/2011  ? Procedure: DILATATION AND EVACUATION;  Surgeon: Betsy Coder, MD;  Location: [facility];  Service: Gynecology;  Laterality: N/A;  ? DILATION AND EVACUATION  12/04/2011  ? Procedure: DILATATION AND EVACUATION;  Surgeon: Betsy Coder, MD;  Location: [facility];  Service: Gynecology;  Laterality: N/A;  ? EXPLORATORY LAPAROTOMY  12/17/2019  ? EXPLORATORY LAPAROTOMY WITH LEFT OVARIAN CYSTECTOMY (Left Abdomen)  ? LAPAROTOMY Left 12/17/2019  ? Procedure: EXPLORATORY LAPAROTOMY WITH LEFT OVARIAN CYSTECTOMY;  Surgeon: Chancy Milroy, MD;  Location: [facility];  Service: Gynecology;  Laterality: Left;  ?  oophrectomy  2007  ? Right , d/t cyst  ? TUBAL LIGATION    ? ? ?Family History  ?Problem Relation Age of Onset  ? Hypertension Mother   ? Hypertension Father   ? Diabetes Paternal Aunt   ? Hypertension Maternal Grandmother   ? Cancer Maternal Grandmother   ? Hypertension Maternal Grandfather   ? Hypertension Paternal Grandmother   ? Hypertension Paternal Grandfather   ? Mental retardation Maternal Uncle   ? Other Neg Hx   ? ? ?Social History Reviewed with no changes to be made today.  ? ?Outpatient Medications Prior to Visit  ?Medication Sig Dispense Refill  ? Iron, Ferrous Sulfate, 325 (65 Fe) MG TABS Take 325 mg by mouth daily. 100 tablet 1  ? valsartan-hydrochlorothiazide (DIOVAN-HCT) 80-12.5 MG tablet Take 1 tablet by mouth daily. 90 tablet 3  ? mupirocin ointment (BACTROBAN) 2 % Apply 1 application topically 2 (two) times daily. Prn tender sores (Patient not taking: Reported on 05/26/2021) 22 g 0  ? ?No facility-administered medications prior to visit.  ? ? ?No Known Allergies ? ?   ?Objective:  ?  ?BP 125/84 (BP Location: Left Arm, Patient Position: Sitting, Cuff Size: Large)   Pulse 75   Temp 98.7 ?F (37.1 ?C) (Oral)   Resp 16   Ht '5\' 3"'$  (1.6 m)   Wt 155 lb (70.3 kg)   LMP 05/03/2021 (Approximate)   SpO2 100%   BMI 27.46 kg/m?  ?Wt Readings from Last 3 Encounters:  ?05/26/21 155 lb (70.3 kg)  ?02/24/21 156 lb 12.8 oz (71.1 kg)  ?01/01/21 155 lb (70.3 kg)  ? ? ?Physical Exam ?Vitals and nursing note reviewed.  ?Constitutional:   ?   Appearance: She is well-developed.  ?HENT:  ?   Head: Normocephalic and atraumatic.  ? ?Cardiovascular:  ?   Rate and Rhythm: Normal rate and regular rhythm.  ?   Heart sounds: Normal heart sounds. No murmur heard. ?  No friction rub. No gallop.  ?Pulmonary:  ?   Effort: Pulmonary effort is normal. No tachypnea or respiratory distress.  ?   Breath sounds: Normal breath sounds. No decreased breath sounds, wheezing, rhonchi or rales.  ?Chest:  ?   Chest wall: No tenderness.   ?Abdominal:  ?   General: Bowel sounds are normal.  ?   Palpations: Abdomen is soft.  ?Musculoskeletal:     ?   General: Normal range of motion.  ?   Cervical back: Normal range of motion.  ?Skin: ?   General: Skin is warm and dry.  ?Neurological:  ?   Mental Status: She is alert and oriented to person, place, and time.  ?   Coordination: Coordination normal.  ?Psychiatric:     ?   Behavior: Behavior normal. Behavior is cooperative.     ?   Thought Content: Thought content normal.     ?  Judgment: Judgment normal.  ? ? ? ? ?   ?Patient has been counseled extensively about nutrition and exercise as well as the importance of adherence with medications and regular follow-up. The patient was given clear instructions to go to ER or return to medical center if symptoms don't improve, worsen or new problems develop. The patient verbalized understanding.  ? ?Follow-up: Return in about 4 weeks (around 06/23/2021) for VIRTUAL TUESDAY left shoulder pain.  ? ?Gildardo Pounds, FNP-BC ?[place] ?[place], [place] ?[phone]   ?05/28/2021, 12:38 AM ?

## 2021-05-26 NOTE — Progress Notes (Signed)
Left side arm and chest pain x 3 mos ?5/10  ?Better with movement ?_______________________ ?  ?Medication refill ?Concerns with low Fe+ ?

## 2021-05-27 LAB — CBC
Hematocrit: 37.2 % (ref 34.0–46.6)
Hemoglobin: 12.2 g/dL (ref 11.1–15.9)
MCH: 26.3 pg — ABNORMAL LOW (ref 26.6–33.0)
MCHC: 32.8 g/dL (ref 31.5–35.7)
MCV: 80 fL (ref 79–97)
Platelets: 217 10*3/uL (ref 150–450)
RBC: 4.63 x10E6/uL (ref 3.77–5.28)
RDW: 14.2 % (ref 11.7–15.4)
WBC: 6.9 10*3/uL (ref 3.4–10.8)

## 2021-05-27 LAB — IRON,TIBC AND FERRITIN PANEL
Ferritin: 35 ng/mL (ref 15–150)
Iron Saturation: 11 % — ABNORMAL LOW (ref 15–55)
Iron: 40 ug/dL (ref 27–159)
Total Iron Binding Capacity: 349 ug/dL (ref 250–450)
UIBC: 309 ug/dL (ref 131–425)

## 2021-05-28 ENCOUNTER — Encounter: Payer: Self-pay | Admitting: Nurse Practitioner

## 2021-06-21 DIAGNOSIS — Z419 Encounter for procedure for purposes other than remedying health state, unspecified: Secondary | ICD-10-CM | POA: Diagnosis not present

## 2021-06-29 ENCOUNTER — Telehealth: Payer: Managed Care, Other (non HMO) | Admitting: Nurse Practitioner

## 2021-07-22 DIAGNOSIS — Z419 Encounter for procedure for purposes other than remedying health state, unspecified: Secondary | ICD-10-CM | POA: Diagnosis not present

## 2021-08-21 DIAGNOSIS — Z419 Encounter for procedure for purposes other than remedying health state, unspecified: Secondary | ICD-10-CM | POA: Diagnosis not present

## 2021-09-21 DIAGNOSIS — Z419 Encounter for procedure for purposes other than remedying health state, unspecified: Secondary | ICD-10-CM | POA: Diagnosis not present

## 2021-10-22 DIAGNOSIS — Z419 Encounter for procedure for purposes other than remedying health state, unspecified: Secondary | ICD-10-CM | POA: Diagnosis not present

## 2021-11-21 DIAGNOSIS — Z419 Encounter for procedure for purposes other than remedying health state, unspecified: Secondary | ICD-10-CM | POA: Diagnosis not present

## 2021-12-22 DIAGNOSIS — Z419 Encounter for procedure for purposes other than remedying health state, unspecified: Secondary | ICD-10-CM | POA: Diagnosis not present

## 2022-01-21 DIAGNOSIS — Z419 Encounter for procedure for purposes other than remedying health state, unspecified: Secondary | ICD-10-CM | POA: Diagnosis not present

## 2022-02-16 IMAGING — CT CT HEAD W/O CM
3 series · 15 of 47 positions shown, 18 images · non-contrast
Comparison: No priors.

CLINICAL DATA: 35-year-old female with history of worsening
headache.

EXAM:
CT HEAD WITHOUT CONTRAST
TECHNIQUE: Contiguous axial images were obtained from the base of the skull
through the vertex without intravenous contrast.

[Series 3: head wo · axial · 0.47mm/px · z∈[-166,-41]mm · 9 of 30 slices shown, 12 images]
[im 3/30  brain]
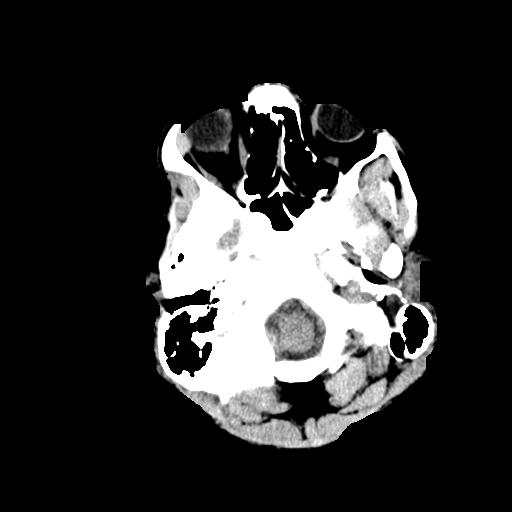
[im 3/30  bone]
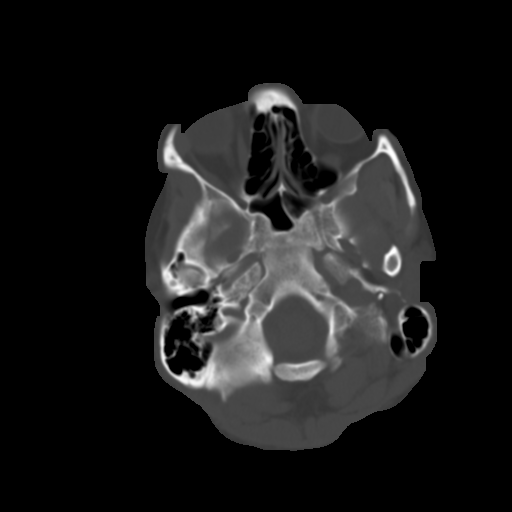
[im 6/30  brain]
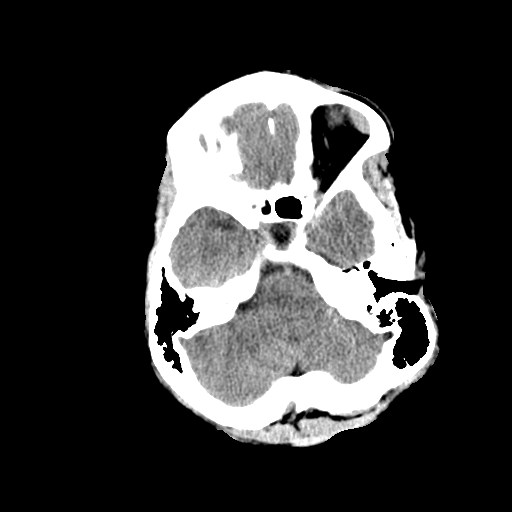
[im 9/30  brain]
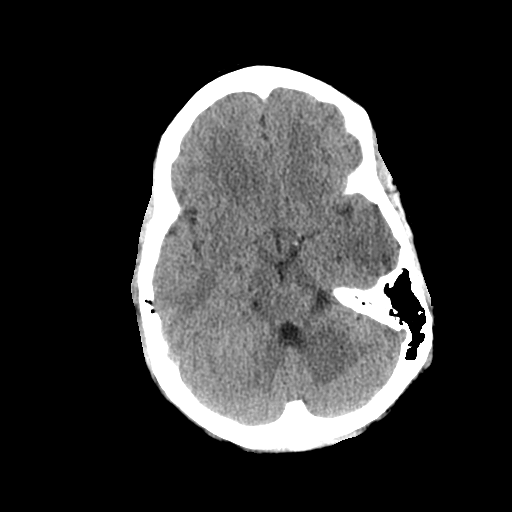
[im 12/30  brain]
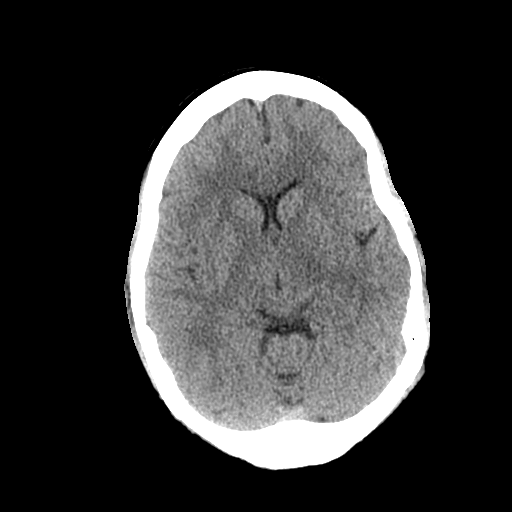
[im 16/30  brain]
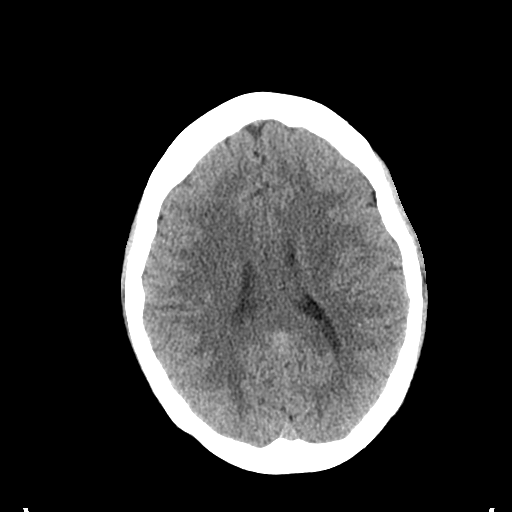
[im 16/30  bone]
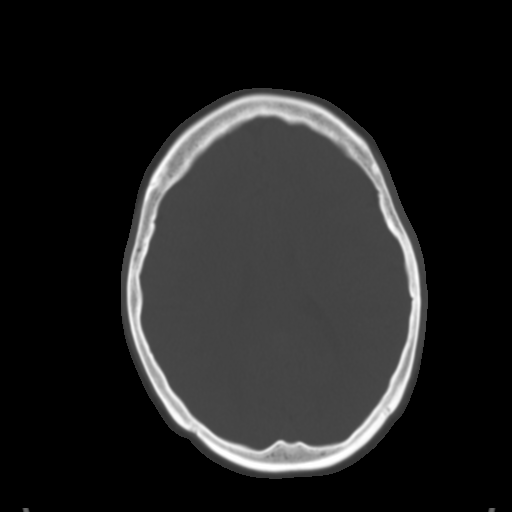
[im 19/30  brain]
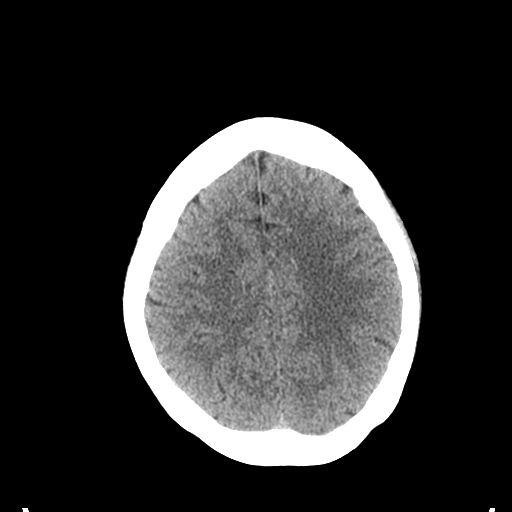
[im 22/30  brain]
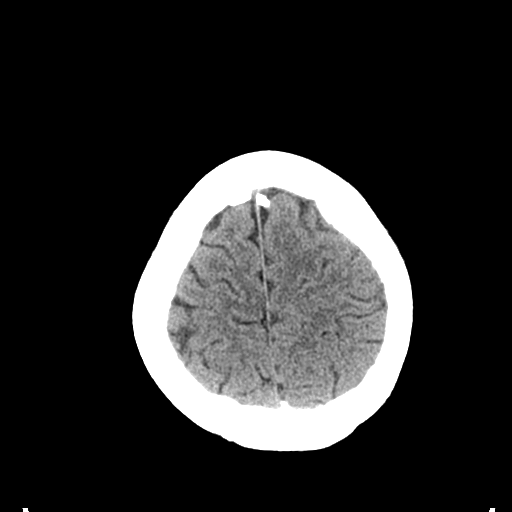
[im 25/30  brain]
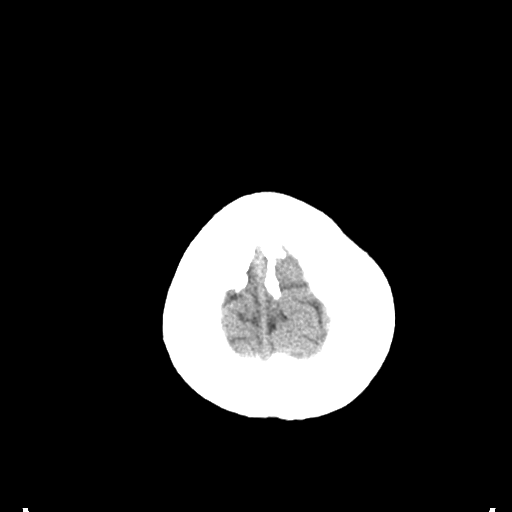
[im 28/30  brain]
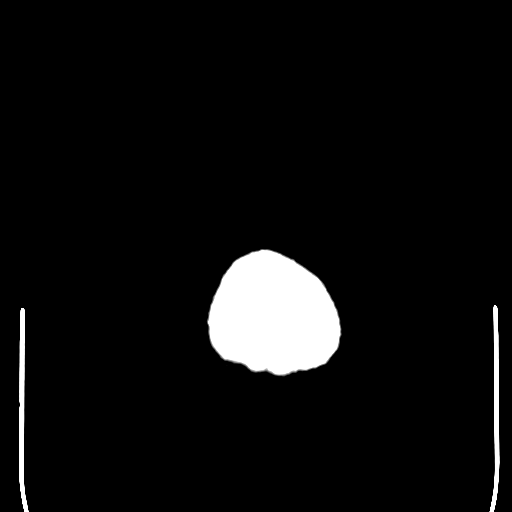
[im 28/30  bone]
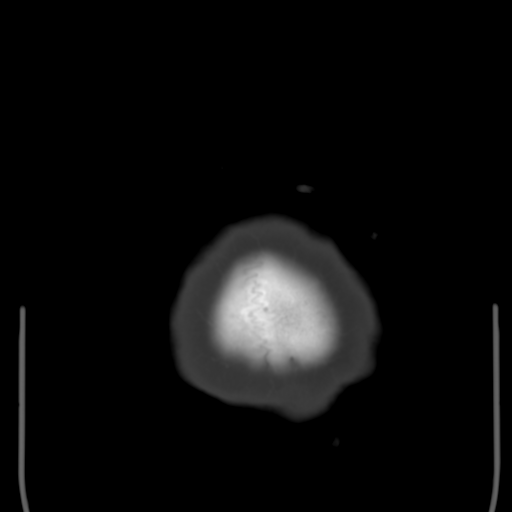

[Series 4: coronal soft tissue · coronal · 0.31mm/px · 3 of 72 slices shown]
[im 24/72  brain]
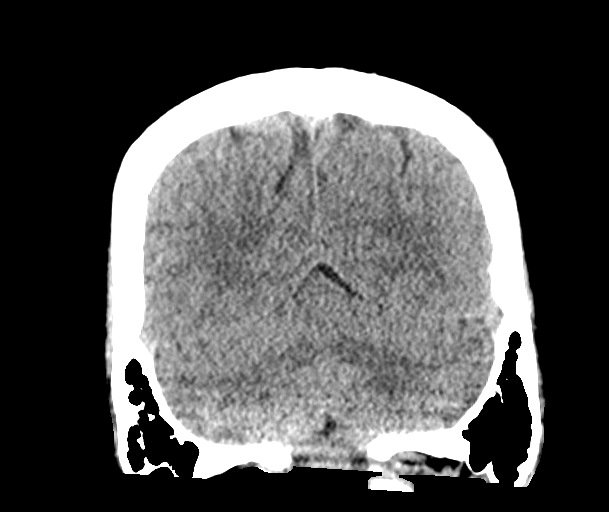
[im 32/72  brain]
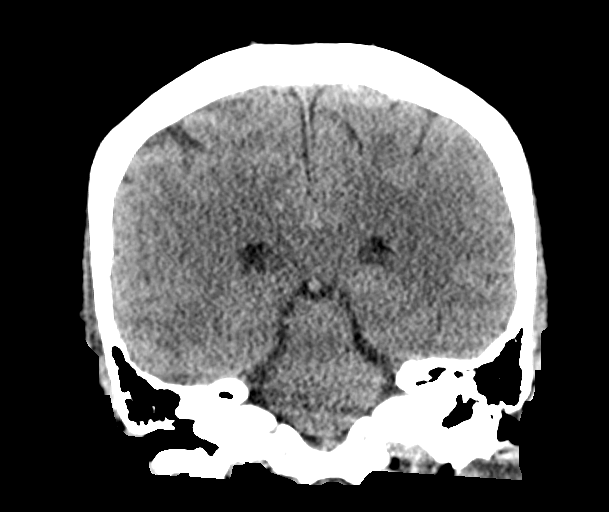
[im 40/72  brain]
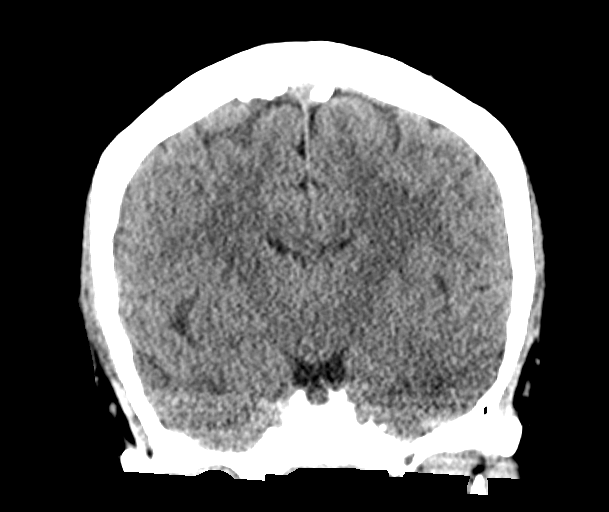

[Series 5: sagittal soft tissue · sagittal · 0.30mm/px · 3 of 61 slices shown]
[im 21/61  brain]
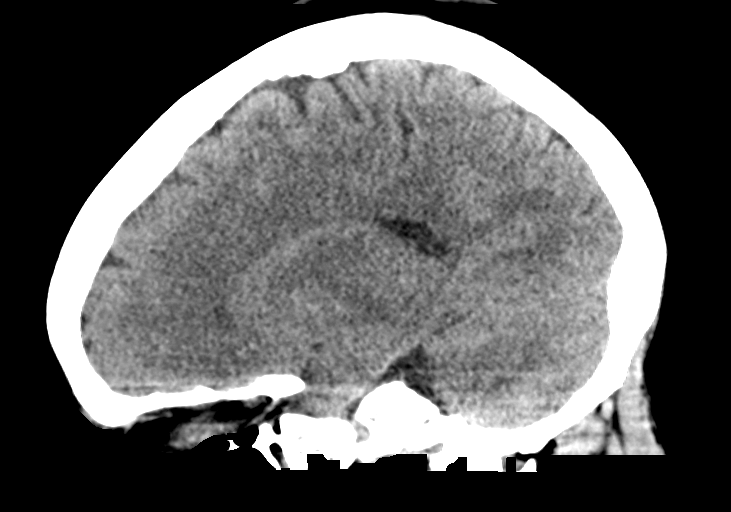
[im 31/61  brain]
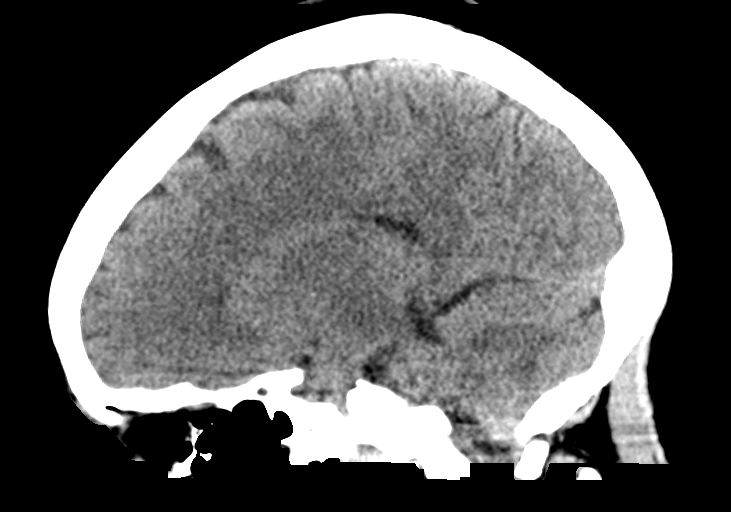
[im 41/61  brain]
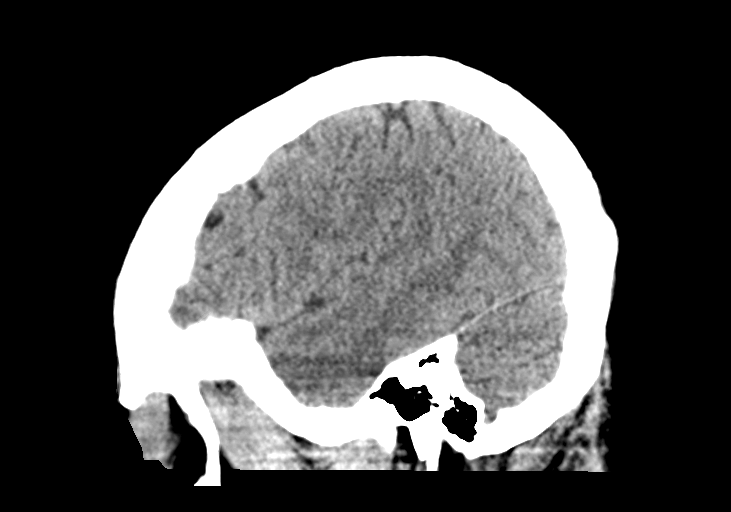

[15 of 47 positions shown; findings below may reference images not displayed]

FINDINGS: Brain: No evidence of acute infarction, hemorrhage, hydrocephalus,
extra-axial collection or mass lesion/mass effect.

Vascular: No hyperdense vessel or unexpected calcification.

Skull: Normal. Negative for fracture or focal lesion.

Sinuses/Orbits: No acute finding.

Other: None.
IMPRESSION: 1. No acute intracranial abnormalities.

## 2022-02-21 DIAGNOSIS — Z419 Encounter for procedure for purposes other than remedying health state, unspecified: Secondary | ICD-10-CM | POA: Diagnosis not present

## 2022-04-05 ENCOUNTER — Other Ambulatory Visit: Payer: Self-pay | Admitting: Nurse Practitioner

## 2022-04-05 DIAGNOSIS — I10 Essential (primary) hypertension: Secondary | ICD-10-CM

## 2022-04-05 NOTE — Telephone Encounter (Signed)
Medication Refill - Medication:   Has the patient contacted their pharmacy? Yes.    (Agent: If yes, when and what did the pharmacy advise?) Contact provider   Preferred Pharmacy (with phone number or street name): [facility], [place], [place] [address]  Has the patient been seen for an appointment in the last year OR does the patient have an upcoming appointment? Yes.    Agent: Please be advised that RX refills may take up to 3 business days. We ask that you follow-up with your pharmacy.

## 2022-04-05 NOTE — Telephone Encounter (Signed)
Medication is valsartan-hydrochlorothiazide (DIOVAN-HCT) 80-12.5 MG tablet

## 2022-04-06 NOTE — Telephone Encounter (Signed)
Rx 05/26/21 #90 3RF- 1 year supply Requested Prescriptions  Pending Prescriptions Disp Refills   valsartan-hydrochlorothiazide (DIOVAN-HCT) 80-12.5 MG tablet 90 tablet 3    Sig: Take 1 tablet by mouth daily.     Cardiovascular: ARB + Diuretic Combos Failed - 04/05/2022 11:52 AM      Failed - K in normal range and within 180 days    Potassium  Date Value Ref Range Status  01/26/2021 3.8 3.5 - 5.1 mmol/L Final         Failed - Na in normal range and within 180 days    Sodium  Date Value Ref Range Status  01/26/2021 136 135 - 145 mmol/L Final  01/01/2021 137 134 - 144 mmol/L Final         Failed - Cr in normal range and within 180 days    Creatinine, Ser  Date Value Ref Range Status  01/26/2021 0.53 0.44 - 1.00 mg/dL Final         Failed - eGFR is 10 or above and within 180 days    GFR calc Af Amer  Date Value Ref Range Status  07/24/2019 >60 >60 mL/min Final   GFR, Estimated  Date Value Ref Range Status  01/26/2021 >60 >60 mL/min Final    Comment:    (NOTE) Calculated using the CKD-EPI Creatinine Equation (2021)    eGFR  Date Value Ref Range Status  01/01/2021 114 >59 mL/min/1.73 Final         Failed - Valid encounter within last 6 months    Recent Outpatient Visits           10 months ago Primary hypertension   [name], [name], NP   1 year ago Essential hypertension   [name], [name]   1 year ago Primary hypertension   [name], [name], NP   2 years ago [name], [name], MD   2 years ago Essential hypertension   [name], NP       Future Appointments             In 1 month Gildardo Pounds, NP [name] - Patient is not pregnant       Passed - Last BP in normal range    BP Readings from Last 1 Encounters:  05/26/21 125/84

## 2022-04-22 NOTE — Telephone Encounter (Signed)
The patient states there were probably days that she took more than 1 a day and that is why she is out so early. She has an appt at the end of the month for a physical. Please assist patient further

## 2022-04-25 NOTE — Telephone Encounter (Signed)
Pt is waiting on a call if her refill request will be approved / pt should have 30 dya supply left but Pt stated at times she took BP meds twice a day / and this is why she may have came up short /Pt asked if she can get a new Refill sent tot he pharmacy / please advise   [place] [place] - [address] AT [place]    Pt was given a 5 day supply and she has 1 tab left for tomorrow / please advise

## 2022-04-27 ENCOUNTER — Other Ambulatory Visit: Payer: Self-pay | Admitting: Nurse Practitioner

## 2022-04-27 DIAGNOSIS — I10 Essential (primary) hypertension: Secondary | ICD-10-CM

## 2022-04-27 NOTE — Telephone Encounter (Signed)
Medication Refill - Medication: valsartan-hydrochlorothiazide (DIOVAN-HCT) 80-12.5 MG tablet   Has the patient contacted their pharmacy? Yes.     Preferred Pharmacy (with phone number or street name):  [facility] [id number] [place], [place] - [address] AT [facility] Phone: [phone]  Fax: [phone]     Has the patient been seen for an appointment in the last year OR does the patient have an upcoming appointment? Yes.     Please assist patient further as she was early before but she is out and needs this refill as soon as she can

## 2022-04-27 NOTE — Telephone Encounter (Signed)
Unable to refill per protocol, Rx  request is too soon. Last refill 05/26/21 for 90 and 3 refills.  Requested Prescriptions  Pending Prescriptions Disp Refills   valsartan-hydrochlorothiazide (DIOVAN-HCT) 80-12.5 MG tablet 90 tablet 3    Sig: Take 1 tablet by mouth daily.     Cardiovascular: ARB + Diuretic Combos Failed - 04/27/2022 12:41 PM      Failed - K in normal range and within 180 days    Potassium  Date Value Ref Range Status  01/26/2021 3.8 3.5 - 5.1 mmol/L Final         Failed - Na in normal range and within 180 days    Sodium  Date Value Ref Range Status  01/26/2021 136 135 - 145 mmol/L Final  01/01/2021 137 134 - 144 mmol/L Final         Failed - Cr in normal range and within 180 days    Creatinine, Ser  Date Value Ref Range Status  01/26/2021 0.53 0.44 - 1.00 mg/dL Final         Failed - eGFR is 10 or above and within 180 days    GFR calc Af Amer  Date Value Ref Range Status  07/24/2019 >60 >60 mL/min Final   GFR, Estimated  Date Value Ref Range Status  01/26/2021 >60 >60 mL/min Final    Comment:    (NOTE) Calculated using the CKD-EPI Creatinine Equation (2021)    eGFR  Date Value Ref Range Status  01/01/2021 114 >59 mL/min/1.73 Final         Failed - Valid encounter within last 6 months    Recent Outpatient Visits           11 months ago Primary hypertension   [place], [name], NP   1 year ago Essential hypertension   [place], [name]   1 year ago Primary hypertension   [place], [name], NP   2 years ago [place] Deleon, [name], MD   2 years ago Essential hypertension   [place] Gildardo Pounds, NP       Future Appointments             In 2 weeks Gildardo Pounds, NP [place] - Patient is not pregnant      Passed - Last BP in normal range    BP Readings from Last 1 Encounters:  05/26/21 125/84

## 2022-05-13 ENCOUNTER — Encounter: Payer: Self-pay | Admitting: Nurse Practitioner

## 2022-05-13 ENCOUNTER — Ambulatory Visit: Payer: Medicaid Other | Attending: Nurse Practitioner | Admitting: Nurse Practitioner

## 2022-05-13 VITALS — BP 123/83 | HR 68 | Ht 63.0 in | Wt 165.0 lb

## 2022-05-13 DIAGNOSIS — R41840 Attention and concentration deficit: Secondary | ICD-10-CM

## 2022-05-13 DIAGNOSIS — Z0000 Encounter for general adult medical examination without abnormal findings: Secondary | ICD-10-CM | POA: Diagnosis not present

## 2022-05-13 DIAGNOSIS — D649 Anemia, unspecified: Secondary | ICD-10-CM | POA: Diagnosis not present

## 2022-05-13 DIAGNOSIS — I10 Essential (primary) hypertension: Secondary | ICD-10-CM

## 2022-05-13 DIAGNOSIS — Z114 Encounter for screening for human immunodeficiency virus [HIV]: Secondary | ICD-10-CM

## 2022-05-13 DIAGNOSIS — Z7251 High risk heterosexual behavior: Secondary | ICD-10-CM

## 2022-05-13 DIAGNOSIS — J302 Other seasonal allergic rhinitis: Secondary | ICD-10-CM

## 2022-05-13 MED ORDER — FLUTICASONE PROPIONATE 50 MCG/ACT NA SUSP: 2.0000 | Freq: Every day | NASAL | 6 refills | Status: DC

## 2022-05-13 MED ORDER — VALSARTAN-HYDROCHLOROTHIAZIDE 80-12.5 MG PO TABS: 1.0000 | ORAL_TABLET | Freq: Every day | ORAL | 1 refills | Status: DC

## 2022-05-13 NOTE — Progress Notes (Signed)
Assessment & Plan:  [name] was seen today for annual exam.  Diagnoses and all orders for this visit:  Encounter for annual physical exam -     CBC with Differential -     CMP14+EGFR  Encounter for screening for HIV -     HIV Antibody (routine testing w rflx)  High risk heterosexual behavior -     HIV Antibody (routine testing w rflx)  Anemia, unspecified type -     CBC with Differential  Primary hypertension Blood pressure well controlled today.  Continue all medications (DIOVAN-HCT) -     CMP14+EGFR  Impaired concentration -     Ambulatory referral to Psychiatry    Patient has been counseled on age-appropriate routine health concerns for screening and prevention. These are reviewed and up-to-date. Referrals have been placed accordingly. Immunizations are up-to-date or declined.    Subjective:   Chief Complaint  Patient presents with   Annual Exam   HPI [name] 37 y.o. female presents to office today for annual physical exam  Requesting referral for evaluation of adult ADHD. Has noticed problems focusing and with concentration over the past several years. No IEP in school but states parents told her she had a hard time with reading. Currently she when reads or attempts to pay attention she has a hard time remembering content and focusing on subject matter.   Review of Systems  Constitutional:  Negative for fever, malaise/fatigue and weight loss.  HENT:  Positive for congestion. Negative for nosebleeds.   Eyes: Negative.  Negative for blurred vision, double vision and photophobia.  Respiratory: Negative.  Negative for cough and shortness of breath.   Cardiovascular: Negative.  Negative for chest pain, palpitations and leg swelling.  Gastrointestinal: Negative.  Negative for heartburn, nausea and vomiting.  Genitourinary: Negative.   Musculoskeletal: Negative.  Negative for myalgias.  Skin: Negative.   Neurological: Negative.  Negative for dizziness,  focal weakness, seizures and headaches.  Endo/Heme/Allergies:  Positive for environmental allergies.  Psychiatric/Behavioral: Negative.  Negative for suicidal ideas.        SEE HPI    Past Medical History:  Diagnosis Date   Abnormal Pap smear    colpo, ok since   Anemia    low iron   Cesarean delivery delivered 02/27/2013   Chlamydia    Headache(784.0)    OTC meds prn   Hypertension    Leg pain    Lower back pain    Ovarian cyst     Past Surgical History:  Procedure Laterality Date   CESAREAN SECTION     CESAREAN SECTION N/A 02/27/2013   Procedure: CESAREAN SECTION with tubal ligation;  Surgeon: Betsy Coder, MD;  Location: [facility];  Service: Obstetrics;  Laterality: N/A;   CESAREAN SECTION     DIAGNOSTIC LAPAROSCOPY  2007   DILATION AND EVACUATION  10/27/2011   Procedure: DILATATION AND EVACUATION;  Surgeon: Betsy Coder, MD;  Location: [facility];  Service: Gynecology;  Laterality: N/A;   DILATION AND EVACUATION  12/04/2011   Procedure: DILATATION AND EVACUATION;  Surgeon: Betsy Coder, MD;  Location: [facility];  Service: Gynecology;  Laterality: N/A;   EXPLORATORY LAPAROTOMY  12/17/2019   EXPLORATORY LAPAROTOMY WITH LEFT OVARIAN CYSTECTOMY (Left Abdomen)   LAPAROTOMY Left 12/17/2019   Procedure: EXPLORATORY LAPAROTOMY WITH LEFT OVARIAN CYSTECTOMY;  Surgeon: Chancy Milroy, MD;  Location: [facility];  Service: Gynecology;  Laterality: Left;   oophrectomy  2007   Right , d/t cyst  TUBAL LIGATION      Family History  Problem Relation Age of Onset   Hypertension Mother    Hypertension Father    Diabetes Paternal Aunt    Hypertension Maternal Grandmother    Cancer Maternal Grandmother    Hypertension Maternal Grandfather    Hypertension Paternal Grandmother    Hypertension Paternal Grandfather    Mental retardation Maternal Uncle    Other Neg Hx     Social History Reviewed with no changes to be made today.   Outpatient Medications Prior to Visit  Medication Sig  Dispense Refill   Iron, Ferrous Sulfate, 325 (65 Fe) MG TABS Take 325 mg by mouth daily. 90 tablet 1   valsartan-hydrochlorothiazide (DIOVAN-HCT) 80-12.5 MG tablet Take 1 tablet by mouth daily. 90 tablet 3   clindamycin-benzoyl peroxide (BENZACLIN) gel Apply topically 2 (two) times daily. USE AS INSTRUCTED (Patient not taking: Reported on 05/13/2022) 25 g 0   mupirocin ointment (BACTROBAN) 2 % Apply 1 application topically 2 (two) times daily. Prn tender sores (Patient not taking: Reported on 05/26/2021) 22 g 0   No facility-administered medications prior to visit.    No Known Allergies     Objective:    BP 123/83   Pulse 68   Ht 5\' 3"  (1.6 m)   Wt 165 lb (74.8 kg)   LMP 05/08/2022 (Exact Date)   SpO2 100%   BMI 29.23 kg/m  Wt Readings from Last 3 Encounters:  05/13/22 165 lb (74.8 kg)  05/26/21 155 lb (70.3 kg)  02/24/21 156 lb 12.8 oz (71.1 kg)    Physical Exam Constitutional:      Appearance: She is well-developed.  HENT:     Head: Normocephalic and atraumatic.     Right Ear: Hearing, tympanic membrane, ear canal and external ear normal.     Left Ear: Hearing, tympanic membrane, ear canal and external ear normal.     Nose: Nose normal.     Right Turbinates: Enlarged, swollen and pale.     Left Turbinates: Enlarged, swollen and pale.     Mouth/Throat:     Lips: Pink.     Mouth: Mucous membranes are moist.     Dentition: No dental tenderness, gingival swelling, dental abscesses or gum lesions.     Pharynx: Oropharynx is clear. Uvula midline. No oropharyngeal exudate or uvula swelling.     Tonsils: No tonsillar exudate or tonsillar abscesses.  Eyes:     General: No scleral icterus.       Right eye: No discharge.     Extraocular Movements: Extraocular movements intact.     Conjunctiva/sclera: Conjunctivae normal.     Pupils: Pupils are equal, round, and reactive to light.  Neck:     Thyroid: No thyromegaly.     Trachea: No tracheal deviation.  Cardiovascular:      Rate and Rhythm: Normal rate and regular rhythm.     Heart sounds: Normal heart sounds. No murmur heard.    No friction rub.  Pulmonary:     Effort: Pulmonary effort is normal. No accessory muscle usage or respiratory distress.     Breath sounds: Normal breath sounds. No decreased breath sounds, wheezing, rhonchi or rales.  Abdominal:     General: Bowel sounds are normal. There is no distension.     Palpations: Abdomen is soft. There is no mass.     Tenderness: There is no abdominal tenderness. There is no right CVA tenderness, left CVA tenderness, guarding or rebound.  Hernia: No hernia is present.  Musculoskeletal:        General: No tenderness or deformity. Normal range of motion.     Cervical back: Normal range of motion and neck supple.  Lymphadenopathy:     Cervical: No cervical adenopathy.  Skin:    General: Skin is warm and dry.     Findings: No erythema.  Neurological:     Mental Status: She is alert and oriented to person, place, and time.     Cranial Nerves: No cranial nerve deficit.     Motor: Motor function is intact.     Coordination: Coordination is intact. Coordination normal.     Gait: Gait is intact.     Deep Tendon Reflexes:     Reflex Scores:      Patellar reflexes are 1+ on the right side and 1+ on the left side. Psychiatric:        Attention and Perception: Attention normal.        Mood and Affect: Mood normal.        Speech: Speech normal.        Behavior: Behavior normal.        Thought Content: Thought content normal.        Judgment: Judgment normal.          Patient has been counseled extensively about nutrition and exercise as well as the importance of adherence with medications and regular follow-up. The patient was given clear instructions to go to ER or return to medical center if symptoms don't improve, worsen or new problems develop. The patient verbalized understanding.   Follow-up: No follow-ups on file.   Gildardo Pounds,  FNP-BC [facility] [place], [place]   05/13/2022, 3:36 PM

## 2022-05-13 NOTE — Patient Instructions (Signed)
SLEEPY TIME TEA OR MELATONIN FOR SLEEP

## 2022-05-14 LAB — CBC WITH DIFFERENTIAL/PLATELET
Basophils Absolute: 0.1 10*3/uL (ref 0.0–0.2)
Basos: 1 %
EOS (ABSOLUTE): 0.5 10*3/uL — ABNORMAL HIGH (ref 0.0–0.4)
Eos: 8 %
Hematocrit: 35.6 % (ref 34.0–46.6)
Hemoglobin: 11.3 g/dL (ref 11.1–15.9)
Immature Grans (Abs): 0 10*3/uL (ref 0.0–0.1)
Immature Granulocytes: 0 %
Lymphocytes Absolute: 2.4 10*3/uL (ref 0.7–3.1)
Lymphs: 41 %
MCH: 26.2 pg — ABNORMAL LOW (ref 26.6–33.0)
MCHC: 31.7 g/dL (ref 31.5–35.7)
MCV: 83 fL (ref 79–97)
Monocytes Absolute: 0.5 10*3/uL (ref 0.1–0.9)
Monocytes: 9 %
Neutrophils Absolute: 2.3 10*3/uL (ref 1.4–7.0)
Neutrophils: 41 %
Platelets: 224 10*3/uL (ref 150–450)
RBC: 4.31 x10E6/uL (ref 3.77–5.28)
RDW: 13.5 % (ref 11.7–15.4)
WBC: 5.8 10*3/uL (ref 3.4–10.8)

## 2022-05-14 LAB — CMP14+EGFR
ALT: 12 IU/L (ref 0–32)
AST: 23 IU/L (ref 0–40)
Albumin/Globulin Ratio: 1.5 (ref 1.2–2.2)
Albumin: 4.1 g/dL (ref 3.9–4.9)
Alkaline Phosphatase: 62 IU/L (ref 44–121)
BUN/Creatinine Ratio: 22 (ref 9–23)
BUN: 18 mg/dL (ref 6–20)
Bilirubin Total: <0.2 mg/dL (ref 0.0–1.2)
CO2: 25 mmol/L (ref 20–29)
Calcium: 8.9 mg/dL (ref 8.7–10.2)
Chloride: 106 mmol/L (ref 96–106)
Creatinine, Ser: 0.82 mg/dL (ref 0.57–1.00)
Globulin, Total: 2.8 g/dL (ref 1.5–4.5)
Glucose: 76 mg/dL (ref 70–99)
Potassium: 4.2 mmol/L (ref 3.5–5.2)
Sodium: 142 mmol/L (ref 134–144)
Total Protein: 6.9 g/dL (ref 6.0–8.5)
eGFR: 94 mL/min/{1.73_m2} (ref >59)

## 2022-05-14 LAB — HIV ANTIBODY (ROUTINE TESTING W REFLEX): HIV Screen 4th Generation wRfx: NONREACTIVE

## 2022-07-21 ENCOUNTER — Encounter (HOSPITAL_COMMUNITY): Payer: Self-pay

## 2022-07-21 ENCOUNTER — Ambulatory Visit (HOSPITAL_COMMUNITY): Payer: Medicaid Other | Admitting: Clinical

## 2022-10-03 ENCOUNTER — Ambulatory Visit (INDEPENDENT_AMBULATORY_CARE_PROVIDER_SITE_OTHER): Payer: Medicaid Other | Admitting: Clinical

## 2022-10-03 ENCOUNTER — Encounter (HOSPITAL_COMMUNITY): Payer: Self-pay

## 2022-10-03 DIAGNOSIS — F331 Major depressive disorder, recurrent, moderate: Secondary | ICD-10-CM | POA: Diagnosis not present
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
## 2022-11-16 ENCOUNTER — Ambulatory Visit: Payer: Medicaid Other | Attending: Nurse Practitioner | Admitting: Nurse Practitioner

## 2022-11-16 ENCOUNTER — Encounter: Payer: Self-pay | Admitting: Nurse Practitioner

## 2022-11-16 VITALS — BP 128/84 | HR 66 | Ht 63.0 in | Wt 167.0 lb

## 2022-11-16 DIAGNOSIS — G8929 Other chronic pain: Secondary | ICD-10-CM | POA: Diagnosis not present

## 2022-11-16 DIAGNOSIS — I10 Essential (primary) hypertension: Secondary | ICD-10-CM | POA: Diagnosis not present

## 2022-11-16 DIAGNOSIS — M79605 Pain in left leg: Secondary | ICD-10-CM | POA: Diagnosis not present

## 2022-11-16 MED ORDER — MELOXICAM 7.5 MG PO TABS: 7.5000 mg | ORAL_TABLET | Freq: Every day | ORAL | 0 refills | Status: DC

## 2022-11-16 NOTE — Progress Notes (Signed)
Assessment & Plan:  [name] was seen today for medical management of chronic issues.  Diagnoses and all orders for this visit:  Primary hypertension    Patient has been counseled on age-appropriate routine health concerns for screening and prevention. These are reviewed and up-to-date. Referrals have been placed accordingly. Immunizations are up-to-date or declined.    Subjective:   Chief Complaint  Patient presents with   Medical Management of Chronic Issues    [name] 37 y.o. female presents to office today for follow up to HTN  Joint/Muscle Pain: Patient complains of arthralgias for which has been present for several months. Pain is located in the left hip(s) radiating down the left leg, is described as aching and stiffness , and is intermittent .  Associated symptoms include: none.  The patient has tried goody powders for pain, with moderate relief.  Related to injury:   no. Lft leg pain. Wakes up leg feels stiff. She denies any low back pain. Pain described as aching. Ongoing for several months now. Worse with waking up in the morning.    HTN Blood pressure is well controlled today. She is currently taking Diovan-HCT 80-12.5 mg daily.  BP Readings from Last 3 Encounters:  11/16/22 128/84  05/13/22 123/83  05/26/21 125/84      Would like to know if she has anxiety. Sometimes feels like her chest is tight, closing up, chest pain. Cardiac work up has been negative.    Review of Systems  Constitutional:  Negative for fever, malaise/fatigue and weight loss.  HENT: Negative.  Negative for nosebleeds.   Eyes: Negative.  Negative for blurred vision, double vision and photophobia.  Respiratory: Negative.  Negative for cough and shortness of breath.   Cardiovascular: Negative.  Negative for chest pain, palpitations and leg swelling.  Gastrointestinal: Negative.  Negative for heartburn, nausea and vomiting.  Musculoskeletal:  Positive for joint pain. Negative for  myalgias.  Neurological: Negative.  Negative for dizziness, focal weakness, seizures and headaches.  Psychiatric/Behavioral: Negative.  Negative for suicidal ideas.     Past Medical History:  Diagnosis Date   Abnormal Pap smear    colpo, ok since   Anemia    low iron   Cesarean delivery delivered 02/27/2013   Chlamydia    Headache(784.0)    OTC meds prn   Hypertension    Leg pain    Lower back pain    Ovarian cyst     Past Surgical History:  Procedure Laterality Date   CESAREAN SECTION     CESAREAN SECTION N/A 02/27/2013   Procedure: CESAREAN SECTION with tubal ligation;  Surgeon: Michael Litter, MD;  Location: [facility];  Service: Obstetrics;  Laterality: N/A;   CESAREAN SECTION     DIAGNOSTIC LAPAROSCOPY  2007   DILATION AND EVACUATION  10/27/2011   Procedure: DILATATION AND EVACUATION;  Surgeon: Michael Litter, MD;  Location: [facility];  Service: Gynecology;  Laterality: N/A;   DILATION AND EVACUATION  12/04/2011   Procedure: DILATATION AND EVACUATION;  Surgeon: Michael Litter, MD;  Location: [facility];  Service: Gynecology;  Laterality: N/A;   EXPLORATORY LAPAROTOMY  12/17/2019   EXPLORATORY LAPAROTOMY WITH LEFT OVARIAN CYSTECTOMY (Left Abdomen)   LAPAROTOMY Left 12/17/2019   Procedure: EXPLORATORY LAPAROTOMY WITH LEFT OVARIAN CYSTECTOMY;  Surgeon: Hermina Staggers, MD;  Location: [facility];  Service: Gynecology;  Laterality: Left;   oophrectomy  2007   Right , d/t cyst   TUBAL LIGATION  Family History  Problem Relation Age of Onset   Hypertension Mother    Hypertension Father    Diabetes Paternal Aunt    Hypertension Maternal Grandmother    Cancer Maternal Grandmother    Hypertension Maternal Grandfather    Hypertension Paternal Grandmother    Hypertension Paternal Grandfather    Mental retardation Maternal Uncle    Other Neg Hx     Social History Reviewed with no changes to be made today.   Outpatient Medications Prior to Visit  Medication Sig Dispense Refill    fluticasone (FLONASE) 50 MCG/ACT nasal spray Place 2 sprays into both nostrils daily. 16 g 6   Iron, Ferrous Sulfate, 325 (65 Fe) MG TABS Take 325 mg by mouth daily. 90 tablet 1   valsartan-hydrochlorothiazide (DIOVAN-HCT) 80-12.5 MG tablet Take 1 tablet by mouth daily. 90 tablet 1   No facility-administered medications prior to visit.    No Known Allergies     Objective:    BP 128/84 (BP Location: Left Arm, Patient Position: Sitting, Cuff Size: Normal)   Pulse 66   Ht 5\' 3"  (1.6 m)   Wt 167 lb (75.8 kg)   LMP 11/14/2022 (Exact Date)   SpO2 100%   BMI 29.58 kg/m  Wt Readings from Last 3 Encounters:  11/16/22 167 lb (75.8 kg)  05/13/22 165 lb (74.8 kg)  05/26/21 155 lb (70.3 kg)    Physical Exam Vitals and nursing note reviewed.  Constitutional:      Appearance: She is well-developed.  HENT:     Head: Normocephalic and atraumatic.  Cardiovascular:     Rate and Rhythm: Normal rate and regular rhythm.     Heart sounds: Normal heart sounds. No murmur heard.    No friction rub. No gallop.  Pulmonary:     Effort: Pulmonary effort is normal. No tachypnea or respiratory distress.     Breath sounds: Normal breath sounds. No decreased breath sounds, wheezing, rhonchi or rales.  Chest:     Chest wall: No tenderness.  Abdominal:     General: Bowel sounds are normal.     Palpations: Abdomen is soft.  Musculoskeletal:        General: Normal range of motion.     Cervical back: Normal range of motion.  Skin:    General: Skin is warm and dry.  Neurological:     Mental Status: She is alert and oriented to person, place, and time.     Coordination: Coordination normal.  Psychiatric:        Behavior: Behavior normal. Behavior is cooperative.        Thought Content: Thought content normal.        Judgment: Judgment normal.          Patient has been counseled extensively about nutrition and exercise as well as the importance of adherence with medications and regular follow-up.  The patient was given clear instructions to go to ER or return to medical center if symptoms don't improve, worsen or new problems develop. The patient verbalized understanding.   Follow-up: No follow-ups on file.   Claiborne Rigg, FNP-BC [facility] [place], [place] [phone]   11/16/2022, 1:43 PM

## 2022-11-17 LAB — CMP14+EGFR
ALT: 13 IU/L (ref 0–32)
AST: 26 IU/L (ref 0–40)
Albumin: 4.3 g/dL (ref 3.9–4.9)
Alkaline Phosphatase: 60 IU/L (ref 44–121)
BUN/Creatinine Ratio: 21 (ref 9–23)
BUN: 17 mg/dL (ref 6–20)
Bilirubin Total: <0.2 mg/dL (ref 0.0–1.2)
CO2: 23 mmol/L (ref 20–29)
Calcium: 9 mg/dL (ref 8.7–10.2)
Chloride: 103 mmol/L (ref 96–106)
Creatinine, Ser: 0.8 mg/dL (ref 0.57–1.00)
Globulin, Total: 2.9 g/dL (ref 1.5–4.5)
Glucose: 82 mg/dL (ref 70–99)
Potassium: 4.4 mmol/L (ref 3.5–5.2)
Sodium: 140 mmol/L (ref 134–144)
Total Protein: 7.2 g/dL (ref 6.0–8.5)
eGFR: 97 mL/min/{1.73_m2} (ref >59)

## 2022-12-09 ENCOUNTER — Ambulatory Visit (INDEPENDENT_AMBULATORY_CARE_PROVIDER_SITE_OTHER): Payer: Medicaid Other | Admitting: Clinical

## 2022-12-09 DIAGNOSIS — F331 Major depressive disorder, recurrent, moderate: Secondary | ICD-10-CM | POA: Diagnosis not present

## 2022-12-09 NOTE — Progress Notes (Signed)
THERAPIST PROGRESS NOTE Virtual Visit via Video Note  I connected with [name] on 12/09/22 at  1:00 PM EDT by a video enabled telemedicine application and verified that I am speaking with the correct person using two identifiers.  Location: Patient: Home Provider: Office   I discussed the limitations of evaluation and management by telemedicine and the availability of in person appointments. The patient expressed understanding and agreed to proceed.   Follow Up Instructions: I discussed the assessment and treatment plan with the patient. The patient was provided an opportunity to ask questions and all were answered. The patient agreed with the plan and demonstrated an understanding of the instructions.   The patient was advised to call back or seek an in-person evaluation if the symptoms worsen or if the condition fails to improve as anticipated.   Session Time: 30 minutes  Participation Level: Active  Behavioral Response: CasualAlertDepressed  Type of Therapy: Individual Therapy  Treatment Goals addressed: [name] will score less than 12 on the Patient Health Questionnaire (PHQ-9)    ProgressTowards Goals: Progressing  Interventions: CBT and Supportive  Summary:  [name] is a 37 y.o. female who presents for the scheduled appointment oriented times five, appropriately dressed and friendly. Client denied hallucinations and delusions. Client reported she has her days of moodiness.  Client reported she is working through her emotions.  Client reported she has been struggling with pleasing others when she is not doing okay.  Client reported could be best friends, family, and her kids.  Client reported she was friends with the man and he became upset with her because she did not want to be in a relationship with him.  Client reported she felt bad that she did not give him what he wanted and felt conflicted about the situation.  Client reported her emotions come  back to childhood of not having an adult or family support.  Client reported after her father's brother died he was industries on drugs and poor.  Of time her mother was chasing after him and also on drugs.  Client reported she eventually did clean up her act.  Client reported she has been worried about her children as her son keeps his emotions bottled up and her daughter seems to be dealing with postpartum depression and a lingering depressive symptoms.  Client reported she would like for both of them to get help.  Client reported otherwise work is going good. Evidence of progress towards goal:  client reported 1 positive of knowing that she needs to work on enforcing boundaries with others to improve her depression and anxiety.   Suicidal/Homicidal: Nowithout intent/plan  Therapist Response:  Therapist began the appointment asking the client how she has been doing since last seen. Therapist used CBT to engage using active listening and positive emotional support. Therapist used CBT to engage with the client and ask her about stressors that are contributing to her negative emotions. Therapist used CBT to discuss and teach about boundaries. Therapist used CBT to empathize with the clients emotions and discussed some communication to help with her kids about them seeking counseling. Therapist used CBT ask the client to identify her progress with frequency of use with coping skills with continued practice in her daily activity.       Plan: Return again in 4 weeks.  Diagnosis: Major depressive disorder, recurrent episode, moderate with anxious distress  Collaboration of Care: Patient refused AEB none requested by the client.  Patient/Guardian was advised Release  of Information must be obtained prior to any record release in order to collaborate their care with an outside provider. Patient/Guardian was advised if they have not already done so to contact the registration department to sign all  necessary forms in order for [place] to release information regarding their care.   Consent: Patient/Guardian gives verbal consent for treatment and assignment of benefits for services provided during this visit. Patient/Guardian expressed understanding and agreed to proceed.   Yolanda Rhymes Torien Ramroop, LCSW 12/09/2022

## 2022-12-30 NOTE — Progress Notes (Unsigned)
Psychiatric Initial Adult Assessment  Patient Identification: [name] MRN:  [id number] Date of Evaluation:  12/30/2022 Referral Source: Bertram Denver, NP  Assessment:  [name] is a 37 y.o. female with a history of *** HTN who presents to [facility] via video conferencing for initial evaluation of ***.  Patient reports ***  Plan:  # *** Past medication trials:  Status of problem: *** Interventions: -- ***  # *** Past medication trials:  Status of problem: *** Interventions: -- ***  # *** Past medication trials:  Status of problem: *** Interventions: -- ***  Patient was given contact information for behavioral health clinic and was instructed to call 911 for emergencies.   Subjective:  Chief Complaint: No chief complaint on file.   History of Present Illness:  ***  Chart review: -- Seen by Deloris Ping LCSW for initial CCA 10/03/22: at that time, dx felt c/w MDD with anxious distress.  Developmental hx; intrauterine exp (mother hx substance use) Mood Trouble focusing   Past Psychiatric History:  Diagnoses: ***MDD with anxious distress Medication trials: ***denies Previous psychiatrist/therapist: ***denies Hospitalizations: *** Suicide attempts: *** SIB: *** Hx of violence towards others: *** Current access to guns: *** Hx of trauma/abuse: ***  Previous Psychotropic Medications: {YES/NO:21197}  Substance Abuse History in the last 12 months:  {yes no:314532}  Past Medical History:  Past Medical History:  Diagnosis Date   Abnormal Pap smear    colpo, ok since   Anemia    low iron   Cesarean delivery delivered 02/27/2013   Chlamydia    Headache(784.0)    OTC meds prn   Hypertension    Leg pain    Lower back pain    Ovarian cyst     Past Surgical History:  Procedure Laterality Date   CESAREAN SECTION     CESAREAN SECTION N/A 02/27/2013   Procedure: CESAREAN SECTION with tubal ligation;  Surgeon: Michael Litter, MD;  Location: [facility];  Service: Obstetrics;  Laterality: N/A;   CESAREAN SECTION     DIAGNOSTIC LAPAROSCOPY  2007   DILATION AND EVACUATION  10/27/2011   Procedure: DILATATION AND EVACUATION;  Surgeon: Michael Litter, MD;  Location: [facility];  Service: Gynecology;  Laterality: N/A;   DILATION AND EVACUATION  12/04/2011   Procedure: DILATATION AND EVACUATION;  Surgeon: Michael Litter, MD;  Location: [facility];  Service: Gynecology;  Laterality: N/A;   EXPLORATORY LAPAROTOMY  12/17/2019   EXPLORATORY LAPAROTOMY WITH LEFT OVARIAN CYSTECTOMY (Left Abdomen)   LAPAROTOMY Left 12/17/2019   Procedure: EXPLORATORY LAPAROTOMY WITH LEFT OVARIAN CYSTECTOMY;  Surgeon: Hermina Staggers, MD;  Location: [facility];  Service: Gynecology;  Laterality: Left;   oophrectomy  2007   Right , d/t cyst   TUBAL LIGATION      Family Psychiatric History: *** denies  Family History:  Family History  Problem Relation Age of Onset   Hypertension Mother    Hypertension Father    Diabetes Paternal Aunt    Hypertension Maternal Grandmother    Cancer Maternal Grandmother    Hypertension Maternal Grandfather    Hypertension Paternal Grandmother    Hypertension Paternal Grandfather    Mental retardation Maternal Uncle    Other Neg Hx     Social History:   Academic/Vocational: ***completed 9th grade; reports she is going back to school at [employer] to get GED; currently employed as a Copy  Social History   Socioeconomic History   Marital status: Single  Spouse name: Not on file   Number of children: Not on file   Years of education: Not on file   Highest education level: Not on file  Occupational History   Not on file  Tobacco Use   Smoking status: Former    Current packs/day: 0.00    Average packs/day: 1 pack/day for 4.0 years (4.0 ttl pk-yrs)    Types: Cigarettes    Start date: 08/20/2015    Quit date: 08/20/2019    Years since quitting: 3.3   Smokeless tobacco: Never   Tobacco comments:    about  1-2 black and mild   Vaping Use   Vaping status: Never Used  Substance and Sexual Activity   Alcohol use: Not Currently    Alcohol/week: 2.0 standard drinks of alcohol    Types: 2 Glasses of wine per week    Comment: rarely   Drug use: No   Sexual activity: Yes    Birth control/protection: Surgical  Other Topics Concern   Not on file  Social History Narrative   Not on file   Social Determinants of Health   Financial Resource Strain: Not on file  Food Insecurity: Not on file  Transportation Needs: Not on file  Physical Activity: Not on file  Stress: Not on file  Social Connections: Not on file    Additional Social History: updated  Allergies:  No Known Allergies  Current Medications: Current Outpatient Medications  Medication Sig Dispense Refill   fluticasone (FLONASE) 50 MCG/ACT nasal spray Place 2 sprays into both nostrils daily. 16 g 6   Iron, Ferrous Sulfate, 325 (65 Fe) MG TABS Take 325 mg by mouth daily. 90 tablet 1   meloxicam (MOBIC) 7.5 MG tablet Take 1 tablet (7.5 mg total) by mouth daily. For left leg pain 30 tablet 0   valsartan-hydrochlorothiazide (DIOVAN-HCT) 80-12.5 MG tablet Take 1 tablet by mouth daily. 90 tablet 1   No current facility-administered medications for this visit.    ROS: Review of Systems  Objective:  Psychiatric Specialty Exam: There were no vitals taken for this visit.There is no height or weight on file to calculate BMI.  General Appearance: {Appearance:22683}  Eye Contact:  {BHH EYE CONTACT:22684}  Speech:  {Speech:22685}  Volume:  {Volume (PAA):22686}  Mood:  {BHH MOOD:22306}  Affect:  {Affect (PAA):22687}  Thought Content: {Thought Content:22690}   Suicidal Thoughts:  {ST/HT (PAA):22692}  Homicidal Thoughts:  {ST/HT (PAA):22692}  Thought Process:  {Thought Process (PAA):22688}  Orientation:  {BHH ORIENTATION (PAA):22689}    Memory: Grossly intact ***  Judgment:  {Judgement (PAA):22694}  Insight:  {Insight (PAA):22695}   Concentration:  {Concentration:21399}  Recall:  not formally assessed ***  Fund of Knowledge: {BHH GOOD/FAIR/POOR:22877}  Language: {BHH GOOD/FAIR/POOR:22877}  Psychomotor Activity:  {Psychomotor (PAA):22696}  Akathisia:  {BHH YES OR NO:22294}  AIMS (if indicated): {Desc; done/not:10129}  Assets:  {Assets (PAA):22698}  ADL's:  {BHH ZOX'W:96045}  Cognition: {chl bhh cognition:304700322}  Sleep:  {BHH GOOD/FAIR/POOR:22877}   PE: General: sits comfortably in view of camera; no acute distress *** Pulm: no increased work of breathing on room air *** MSK: all extremity movements appear intact *** Neuro: no focal neurological deficits observed *** Gait & Station: unable to assess by video ***   Metabolic Disorder Labs: No results found for: "HGBA1C", "MPG" No results found for: "PROLACTIN" No results found for: "CHOL", "TRIG", "HDL", "CHOLHDL", "VLDL", "LDLCALC" No results found for: "TSH"  Therapeutic Level Labs: No results found for: "LITHIUM" No results found for: "CBMZ" No  results found for: "VALPROATE"  Screenings:  GAD-7    Flowsheet Row Counselor from 10/03/2022 in [facility] Office Visit from 05/13/2022 in [facility] - A Dept Of [place]. [facility] Office Visit from 05/26/2021 in [facility] [place] - A Dept Of [place] Bridegroom. [facility] Office Visit from 02/24/2021 in [facility] [place] - A Dept Of [place] Bridegroom. [facility] Office Visit from 01/01/2021 in [facility] [place] - A Dept Of [place] Bridegroom. [facility]  Total GAD-7 Score 12 10 6 7 10       PHQ2-9    Flowsheet Row Counselor from 10/03/2022 in [facility] Office Visit from 05/13/2022 in [facility] - A Dept Of [place]. [facility] Office Visit from 05/26/2021 in [facility] [place] - A Dept Of [place]a Bridegroom. [facility] Office Visit  from 02/24/2021 in [facility] [place] - A Dept Of [place]a Bridegroom. [facility] Office Visit from 01/01/2021 in [facility] - A Dept Of [place] Bridegroom. [facility]  PHQ-2 Total Score 4 2 2 2 2   PHQ-9 Total Score 13 13 8 11 9       Flowsheet Row Counselor from 10/03/2022 in [facility] ED from 01/26/2021 in [facility] Emergency Department at [facility] ED from 09/19/2020 in [facility] Department at [facility]  C-SSRS RISK CATEGORY No Risk No Risk No Risk       Collaboration of Care: Collaboration of Care: [facility] OP Collaboration of YQMV:[id number]}  Patient/Guardian was advised Release of Information must be obtained prior to any record release in order to collaborate their care with an outside provider. Patient/Guardian was advised if they have not already done so to contact the registration department to sign all necessary forms in order for [place] to release information regarding their care.   Consent: Patient/Guardian gives verbal consent for treatment and assignment of benefits for services provided during this visit. Patient/Guardian expressed understanding and agreed to proceed.   Televisit via video: I connected with [name] on 12/30/22 at  3:00 PM EST by a video enabled telemedicine application and verified that I am speaking with the correct person using two identifiers.  Location: Patient: *** Provider: remote office in [place]   I discussed the limitations of evaluation and management by telemedicine and the availability of in person appointments. The patient expressed understanding and agreed to proceed.  I discussed the assessment and treatment plan with the patient. The patient was provided an opportunity to ask questions and all were answered. The patient agreed with the plan and demonstrated an understanding of the instructions.   The patient was advised to call back or seek an  in-person evaluation if the symptoms worsen or if the condition fails to improve as anticipated.  I provided *** minutes dedicated to the care of this patient via video on the date of this encounter to include chart review, face-to-face time with the patient, medication management/counseling ***.  Ladarren Steiner A Nadene Witherspoon 11/8/20248:51 AM

## 2023-01-02 ENCOUNTER — Ambulatory Visit (INDEPENDENT_AMBULATORY_CARE_PROVIDER_SITE_OTHER): Payer: Medicaid Other | Admitting: Psychiatry

## 2023-01-02 ENCOUNTER — Encounter (HOSPITAL_COMMUNITY): Payer: Self-pay | Admitting: Psychiatry

## 2023-01-02 DIAGNOSIS — F330 Major depressive disorder, recurrent, mild: Secondary | ICD-10-CM | POA: Insufficient documentation

## 2023-01-02 DIAGNOSIS — F331 Major depressive disorder, recurrent, moderate: Secondary | ICD-10-CM | POA: Diagnosis not present

## 2023-01-02 DIAGNOSIS — F4310 Post-traumatic stress disorder, unspecified: Secondary | ICD-10-CM

## 2023-01-02 MED ORDER — SERTRALINE HCL 50 MG PO TABS: ORAL_TABLET | ORAL | 1 refills | Status: DC

## 2023-01-02 NOTE — Patient Instructions (Addendum)
Thank you for attending your appointment today.  -- START Zoloft 25 mg daily; after 1 week increase to 50 mg daily. Please see attached for additional information about this medication. -- Continue other medications as prescribed.  Please do not make any changes to medications without first discussing with your provider. If you are experiencing a psychiatric emergency, please call 911 or present to your nearest emergency department. Additional crisis, medication management, and therapy resources are included below.  [facility]  [address] [place], [place] [address] [phone] WALK-IN URGENT CARE 24/7 FOR ANYONE [address], [place], [place]  [phone] Fax: [phone] [email or web address] *Interpreters available *Accepts all insurance and uninsured for Urgent Care needs *Accepts Medicaid and uninsured for outpatient treatment (below)      ONLY FOR [facility]  Below:    Outpatient New Patient Assessment/Therapy Walk-ins:        Monday -Thursday 8am until slots are full.        Every Friday 1pm-4pm  (first come, first served)                   New Patient Psychiatry/Medication Management        Monday-Friday 8am-11am (first come, first served)               For all walk-ins we ask that you arrive by 7:15am, because patients will be seen in the order of arrival.

## 2023-01-05 ENCOUNTER — Other Ambulatory Visit: Payer: Self-pay | Admitting: Nurse Practitioner

## 2023-01-05 ENCOUNTER — Ambulatory Visit (INDEPENDENT_AMBULATORY_CARE_PROVIDER_SITE_OTHER): Payer: Medicaid Other | Admitting: Clinical

## 2023-01-05 DIAGNOSIS — F331 Major depressive disorder, recurrent, moderate: Secondary | ICD-10-CM | POA: Diagnosis not present

## 2023-01-05 DIAGNOSIS — I10 Essential (primary) hypertension: Secondary | ICD-10-CM

## 2023-01-05 MED ORDER — VALSARTAN-HYDROCHLOROTHIAZIDE 80-12.5 MG PO TABS: 1.0000 | ORAL_TABLET | Freq: Every day | ORAL | 0 refills | Status: DC

## 2023-01-05 NOTE — Telephone Encounter (Signed)
Requested Prescriptions  Pending Prescriptions Disp Refills   valsartan-hydrochlorothiazide (DIOVAN-HCT) 80-12.5 MG tablet 90 tablet 0    Sig: Take 1 tablet by mouth daily.     Cardiovascular: ARB + Diuretic Combos Passed - 01/05/2023 10:53 AM      Passed - K in normal range and within 180 days    Potassium  Date Value Ref Range Status  11/16/2022 4.4 3.5 - 5.2 mmol/L Final         Passed - Na in normal range and within 180 days    Sodium  Date Value Ref Range Status  11/16/2022 140 134 - 144 mmol/L Final         Passed - Cr in normal range and within 180 days    Creatinine, Ser  Date Value Ref Range Status  11/16/2022 0.80 0.57 - 1.00 mg/dL Final         Passed - eGFR is 10 or above and within 180 days    GFR calc Af Amer  Date Value Ref Range Status  07/24/2019 >60 >60 mL/min Final   GFR, Estimated  Date Value Ref Range Status  01/26/2021 >60 >60 mL/min Final    Comment:    (NOTE) Calculated using the CKD-EPI Creatinine Equation (2021)    eGFR  Date Value Ref Range Status  11/16/2022 97 >59 mL/min/1.73 Final         Passed - Patient is not pregnant      Passed - Last BP in normal range    BP Readings from Last 1 Encounters:  11/16/22 128/84         Passed - Valid encounter within last 6 months    Recent Outpatient Visits           1 month ago Primary hypertension   [place] [facility] Dept Of [place]. [facility] Claiborne Rigg, NP   7 months ago Encounter for annual physical exam   [place] [facility] [place] - A Dept Of [place]. [facility] Claiborne Rigg, NP   1 year ago Primary hypertension   [place] [facility] [place] - A Dept Of [place]. [facility] Claiborne Rigg, NP   1 year ago Essential hypertension   [place] [facility] [place] - A Dept Of [place]. [facility], Marzella Schlein, [place]   2 years ago Primary hypertension   [place] [facility] [place] - A Dept Of  [place]. [facility] Claiborne Rigg, NP       Future Appointments             In 1 month Claiborne Rigg, NP [facility] [place] - A Dept Of [place]. [facility]

## 2023-01-05 NOTE — Telephone Encounter (Signed)
Medication Refill -  Most Recent Primary Care Visit:  Provider: Bertram Denver W  Department: [facility]  Visit Type: OFFICE VISIT  Date: 11/16/2022  Medication: valsartan-hydrochlorothiazide (DIOVAN-HCT) 80-12.5 MG tablet [[id number]]   Has the patient contacted their pharmacy? Yes  (Agent: If yes, when and what did the pharmacy advise?) Contact office   Is this the correct pharmacy for this prescription? Yes  This is the patient's preferred pharmacy:  [facility] [address] [place] - [address] AT [address] [place] [address] Phone: [phone] Fax: [phone]   Has the prescription been filled recently? Yes  Is the patient out of the medication? No Pt has enough to last until Sunday   Has the patient been seen for an appointment in the last year OR does the patient have an upcoming appointment? Yes  Can we respond through MyChart? Yes  Agent: Please be advised that Rx refills may take up to 3 business days. We ask that you follow-up with your pharmacy.

## 2023-01-05 NOTE — Progress Notes (Signed)
THERAPIST PROGRESS NOTE Virtual Visit via Video Note  I connected with [name] on 01/05/2023 at  3:00 PM EST by a video enabled telemedicine application and verified that I am speaking with the correct person using two identifiers.  Location: Patient: work Provider: office   I discussed the limitations of evaluation and management by telemedicine and the availability of in person appointments. The patient expressed understanding and agreed to proceed.   Follow Up Instructions: I discussed the assessment and treatment plan with the patient. The patient was provided an opportunity to ask questions and all were answered. The patient agreed with the plan and demonstrated an understanding of the instructions.   The patient was advised to call back or seek an in-person evaluation if the symptoms worsen or if the condition fails to improve as anticipated.   Session Time: 40 minutes  Participation Level: Active  Behavioral Response: CasualAlertDepressed  Type of Therapy: Individual Therapy  Treatment Goals addressed: [place] will score less than 12 on the Patient Health Questionnaire (PHQ-9)   ProgressTowards Goals: Not Progressing  Interventions: CBT  Summary:  [name] is a 37 y.o. female who presents for the scheduled appointment oriented x 5, appropriately dressed, and friendly.  Client denied hallucinations and delusions. Client reported on today she continues to have mood swings.  Client reported she struggles with irritability and becoming defensive.  Client reported she has noticed an issue for her but a friend pointed out to her as well.  Client reported she tends to hold onto certain comments that were made doing a conversation and it causes her to feel approximately intact.  Client reported she easily gets defensive.  Client reported she also has a constant battle with her 2 oldest children about communicating and also helping around the house.  Client  reported she has a hard time with getting her son to go seek mental health as well.  Client reported between getting her youngest child ready for school and then coming home to cook and clean it is a lot for her to do and she wants her oldest children to step up.  Client reported she also wants to become motivated to do things for herself such as hobbies. Evidence of progress towards goal: Client reported 1 negative cognitive pattern that contributes to her depression and anxiety about personalizing negative comments that the people say.  Suicidal/Homicidal: Nowithout intent/plan  Therapist Response:  Therapist began the appointment asking how she has been doing. Therapist engaged using active listening and positive emotional support. Therapist used cbt to engage and give the client time to discuss her thoughts and family and personal stressors. Therapist used cbt to collaborate and discuss how to problem solve issues with children and her own response to conflict. Therapist used CBT ask the client to identify her progress with frequency of use with coping skills with continued practice in her daily activity.    Therapist assigned the client homework to practice self care and skills discussed.    Plan: Return again in 4 weeks.  Diagnosis: major depressive disorder, recurrent episode moderate with anxious distress  Collaboration of Care: Patient refused AEB none requested by the client.  Patient/Guardian was advised Release of Information must be obtained prior to any record release in order to collaborate their care with an outside provider. Patient/Guardian was advised if they have not already done so to contact the registration department to sign all necessary forms in order for [place] to release information regarding their  care.   Consent: Patient/Guardian gives verbal consent for treatment and assignment of benefits for services provided during this visit. Patient/Guardian expressed  understanding and agreed to proceed.   Neena Rhymes Pansy Ostrovsky, LCSW 01/05/2023

## 2023-01-26 ENCOUNTER — Encounter (HOSPITAL_COMMUNITY): Payer: Self-pay

## 2023-01-26 ENCOUNTER — Ambulatory Visit (HOSPITAL_COMMUNITY): Payer: Medicaid Other | Admitting: Clinical
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
## 2023-02-08 ENCOUNTER — Encounter (HOSPITAL_COMMUNITY): Payer: Self-pay | Admitting: Psychiatry

## 2023-02-08 ENCOUNTER — Telehealth (HOSPITAL_COMMUNITY): Payer: Medicaid Other | Admitting: Psychiatry

## 2023-02-08 DIAGNOSIS — F4310 Post-traumatic stress disorder, unspecified: Secondary | ICD-10-CM

## 2023-02-08 DIAGNOSIS — F331 Major depressive disorder, recurrent, moderate: Secondary | ICD-10-CM

## 2023-02-08 MED ORDER — SERTRALINE HCL 50 MG PO TABS: 50.0000 mg | ORAL_TABLET | Freq: Every day | ORAL | 1 refills | Status: DC

## 2023-02-08 NOTE — Patient Instructions (Signed)

## 2023-02-09 ENCOUNTER — Ambulatory Visit (INDEPENDENT_AMBULATORY_CARE_PROVIDER_SITE_OTHER): Payer: Medicaid Other | Admitting: Clinical

## 2023-02-09 DIAGNOSIS — F331 Major depressive disorder, recurrent, moderate: Secondary | ICD-10-CM

## 2023-02-09 NOTE — Progress Notes (Signed)
THERAPIST PROGRESS NOTE Virtual Visit via Video Note  I connected with Marny L Burbach on 02/09/23 at  8:00 AM EST by a video enabled telemedicine application and verified that I am speaking with the correct person using two identifiers.  Location: Patient: work Provider: office   I discussed the limitations of evaluation and management by telemedicine and the availability of in person appointments. The patient expressed understanding and agreed to proceed.   Follow Up Instructions: I discussed the assessment and treatment plan with the patient. The patient was provided an opportunity to ask questions and all were answered. The patient agreed with the plan and demonstrated an understanding of the instructions.   The patient was advised to call back or seek an in-person evaluation if the symptoms worsen or if the condition fails to improve as anticipated.   Session Time: 25 minutes  Participation Level: Active  Behavioral Response: CasualAlertDepressed  Type of Therapy: Individual Therapy  Treatment Goals addressed: Zoye will score less than 12 on the Patient Health Questionnaire (PHQ-9)   ProgressTowards Goals: Progressing  Interventions: CBT and Supportive  Summary:  Emanuella L Esco is a 37 y.o. female who presents for scheduled appointment oriented x 5, appropriately dressed, and friendly.  Client denied hallucinations and delusions. Client reported on today she is doing fairly okay.  Client reported over the past week she went on a cruise with some of her close girlfriends.  Client reported it was a nice time by her friend did point out to her how she is always on guard and looking tense. Client reported it is hard for her to be lax and she is socialize.  Client reported that interferes with her desire to interact with men who are interested in her.  Client reported she did speak with her son about going to therapy and he is agreeable.  Client reported she often  worries about him.  Client reported she and her daughter are still at odds.  Client reported she feels some guilt about her daughter's current situation that she wants her daughter to finish school so she can work.  Client reported otherwise she is doing well with the medication and is adjusting to it. Evidence of progress towards goal:  client reported 1 skill use of mindfulness to help brainstorm how to improve her behaviors.   Suicidal/Homicidal: Nowithout intent/plan  Therapist Response:  Therapist began the appointment asking the client how she has been doing. Therapist used cbt to engage using active listening and positive emotional support. Therapist used cbt to engage and give the client time to discuss changes and how she is trying to cope. Therapist used cbt to engage with the client to discuss assertive communication and exposure for anxiety provoking situations. Therapist used CBT ask the client to identify her progress with frequency of use with coping skills with continued practice in her daily activity.    Therapist assigned the client self care homework.  Plan: Return again in 4 weeks.  Diagnosis: major depressive disorder, recurrent episode, moderate with anxious distress  Collaboration of Care: Patient refused AEB none requested by the client.  Patient/Guardian was advised Release of Information must be obtained prior to any record release in order to collaborate their care with an outside provider. Patient/Guardian was advised if they have not already done so to contact the registration department to sign all necessary forms in order for [place] to release information regarding their care.   Consent: Patient/Guardian gives verbal consent for treatment and assignment  of benefits for services provided during this visit. Patient/Guardian expressed understanding and agreed to proceed.   Neena Rhymes Donzell Coller, LCSW 02/09/2023

## 2023-02-16 ENCOUNTER — Telehealth (HOSPITAL_COMMUNITY): Payer: Self-pay | Admitting: Psychiatry

## 2023-02-16 NOTE — Telephone Encounter (Signed)
PT called and LVM stating that her medications are not working well for her - please reach out to PT when time allows [phone]  Phone call for 5 minutes. Called the patient. Mood wise, things are pretty good, but does not think the zoloft is working for her in terms of side effects. Has been feeling tired than she normally would and is the biggest problem. Gets tired even with taking at night time. Will see PCP tomorrow for blood work. Will start cutting the zoloft in half for now.

## 2023-02-17 ENCOUNTER — Encounter: Payer: Self-pay | Admitting: Nurse Practitioner

## 2023-02-17 ENCOUNTER — Ambulatory Visit: Payer: Medicaid Other | Attending: Nurse Practitioner | Admitting: Nurse Practitioner

## 2023-02-17 VITALS — BP 120/79 | HR 83 | Ht 63.0 in | Wt 175.6 lb

## 2023-02-17 DIAGNOSIS — I10 Essential (primary) hypertension: Secondary | ICD-10-CM

## 2023-02-17 DIAGNOSIS — R5383 Other fatigue: Secondary | ICD-10-CM | POA: Diagnosis not present

## 2023-02-17 MED ORDER — VALSARTAN-HYDROCHLOROTHIAZIDE 80-12.5 MG PO TABS: 1.0000 | ORAL_TABLET | Freq: Every day | ORAL | 1 refills | Status: DC

## 2023-02-17 NOTE — Progress Notes (Signed)
Assessment & Plan:  [name] was seen today for medical management of chronic issues.  Diagnoses and all orders for this visit:  Primary hypertension -     valsartan-hydrochlorothiazide (DIOVAN-HCT) 80-12.5 MG tablet; Take 1 tablet by mouth daily. Continue all antihypertensives as prescribed.  Reminded to bring in blood pressure log for follow  up appointment.  RECOMMENDATIONS: DASH/Mediterranean Diets are healthier choices for HTN.     Fatigue, unspecified type May need to try to decrease dose to half of sertraline -     Thyroid Panel With TSH -     CBC with Differential    Patient has been counseled on age-appropriate routine health concerns for screening and prevention. These are reviewed and up-to-date. Referrals have been placed accordingly. Immunizations are up-to-date or declined.    Subjective:   Chief Complaint  Patient presents with   Medical Management of Chronic Issues    [name] 37 y.o. female presents to office today for follow up to HTN  Blood pressure is well controlled today. She is taking diovan hct 80-12.5 mg daily as prescribed.  BP Readings from Last 3 Encounters:  02/17/23 120/79  11/16/22 128/84  05/13/22 123/83    She was recently started on sertraline 50 mg by [facility]. Endorses fatigue and drowsiness since starting. States she was instructed  by [facility] to have her thyroid checked to make sure her levels are normal.    Review of Systems  Constitutional:  Positive for malaise/fatigue. Negative for fever and weight loss.  HENT: Negative.  Negative for nosebleeds.   Eyes: Negative.  Negative for blurred vision, double vision and photophobia.  Respiratory: Negative.  Negative for cough and shortness of breath.   Cardiovascular: Negative.  Negative for chest pain, palpitations and leg swelling.  Gastrointestinal: Negative.  Negative for heartburn, nausea and vomiting.  Musculoskeletal: Negative.  Negative for myalgias.  Neurological: Negative.   Negative for dizziness, focal weakness, seizures and headaches.  Psychiatric/Behavioral:  Negative for suicidal ideas.     Past Medical History:  Diagnosis Date   Abnormal Pap smear    colpo, ok since   Anemia    low iron   Cesarean delivery delivered 02/27/2013   Chlamydia    Depression    Headache(784.0)    OTC meds prn   Hypertension    Leg pain    Lower back pain    Ovarian cyst    PTSD (post-traumatic stress disorder)     Past Surgical History:  Procedure Laterality Date   CESAREAN SECTION     CESAREAN SECTION N/A 02/27/2013   Procedure: CESAREAN SECTION with tubal ligation;  Surgeon: Michael Litter, MD;  Location: [facility];  Service: Obstetrics;  Laterality: N/A;   CESAREAN SECTION     DIAGNOSTIC LAPAROSCOPY  2007   DILATION AND EVACUATION  10/27/2011   Procedure: DILATATION AND EVACUATION;  Surgeon: Michael Litter, MD;  Location: [facility];  Service: Gynecology;  Laterality: N/A;   DILATION AND EVACUATION  12/04/2011   Procedure: DILATATION AND EVACUATION;  Surgeon: Michael Litter, MD;  Location: [facility];  Service: Gynecology;  Laterality: N/A;   EXPLORATORY LAPAROTOMY  12/17/2019   EXPLORATORY LAPAROTOMY WITH LEFT OVARIAN CYSTECTOMY (Left Abdomen)   LAPAROTOMY Left 12/17/2019   Procedure: EXPLORATORY LAPAROTOMY WITH LEFT OVARIAN CYSTECTOMY;  Surgeon: Hermina Staggers, MD;  [facility];  Service: Gynecology;  Laterality: Left;   oophrectomy  2007   Right , d/t cyst   TUBAL LIGATION  Family History  Problem Relation Age of Onset   Hypertension Mother    Hypertension Father    Diabetes Paternal Aunt    Hypertension Maternal Grandmother    Cancer Maternal Grandmother    Hypertension Maternal Grandfather    Hypertension Paternal Grandmother    Hypertension Paternal Grandfather    Mental retardation Maternal Uncle    Other Neg Hx     Social History Reviewed with no changes to be made today.   Outpatient Medications Prior to Visit  Medication Sig Dispense  Refill   fluticasone (FLONASE) 50 MCG/ACT nasal spray Place 2 sprays into both nostrils daily. 16 g 6   Iron, Ferrous Sulfate, 325 (65 Fe) MG TABS Take 325 mg by mouth daily. 90 tablet 1   meloxicam (MOBIC) 7.5 MG tablet Take 1 tablet (7.5 mg total) by mouth daily. For left leg pain 30 tablet 0   sertraline (ZOLOFT) 50 MG tablet Take 1 tablet (50 mg total) by mouth daily. 30 tablet 1   valsartan-hydrochlorothiazide (DIOVAN-HCT) 80-12.5 MG tablet Take 1 tablet by mouth daily. 90 tablet 0   No facility-administered medications prior to visit.    No Known Allergies     Objective:    BP 120/79 (BP Location: Left Arm, Patient Position: Sitting, Cuff Size: Large)   Pulse 83   Ht 5\' 3"  (1.6 m)   Wt 175 lb 9.6 oz (79.7 kg)   LMP 02/06/2023 (Exact Date)   SpO2 100%   BMI 31.11 kg/m  Wt Readings from Last 3 Encounters:  02/17/23 175 lb 9.6 oz (79.7 kg)  11/16/22 167 lb (75.8 kg)  05/13/22 165 lb (74.8 kg)    Physical Exam Vitals and nursing note reviewed.  Constitutional:      Appearance: She is well-developed.  HENT:     Head: Normocephalic and atraumatic.  Cardiovascular:     Rate and Rhythm: Normal rate and regular rhythm.     Heart sounds: Normal heart sounds. No murmur heard.    No friction rub. No gallop.  Pulmonary:     Effort: Pulmonary effort is normal. No tachypnea or respiratory distress.     Breath sounds: Normal breath sounds. No decreased breath sounds, wheezing, rhonchi or rales.  Chest:     Chest wall: No tenderness.  Abdominal:     General: Bowel sounds are normal.     Palpations: Abdomen is soft.  Musculoskeletal:        General: Normal range of motion.     Cervical back: Normal range of motion.  Skin:    General: Skin is warm and dry.  Neurological:     Mental Status: She is alert and oriented to person, place, and time.     Coordination: Coordination normal.  Psychiatric:        Behavior: Behavior normal. Behavior is cooperative.        Thought  Content: Thought content normal.        Judgment: Judgment normal.          Patient has been counseled extensively about nutrition and exercise as well as the importance of adherence with medications and regular follow-up. The patient was given clear instructions to go to ER or return to medical center if symptoms don't improve, worsen or new problems develop. The patient verbalized understanding.   Follow-up: Return in about 3 months (around 05/18/2023).   Claiborne Rigg, FNP-BC [facility] [place], [place] [phone]   02/17/2023, 2:38 PM

## 2023-02-18 LAB — CBC WITH DIFFERENTIAL/PLATELET
Basophils Absolute: 0.1 10*3/uL (ref 0.0–0.2)
Basos: 1 %
EOS (ABSOLUTE): 0.6 10*3/uL — ABNORMAL HIGH (ref 0.0–0.4)
Eos: 7 %
Hematocrit: 36 % (ref 34.0–46.6)
Hemoglobin: 11.4 g/dL (ref 11.1–15.9)
Immature Grans (Abs): 0 10*3/uL (ref 0.0–0.1)
Immature Granulocytes: 0 %
Lymphocytes Absolute: 2.9 10*3/uL (ref 0.7–3.1)
Lymphs: 32 %
MCH: 26.6 pg (ref 26.6–33.0)
MCHC: 31.7 g/dL (ref 31.5–35.7)
MCV: 84 fL (ref 79–97)
Monocytes Absolute: 0.7 10*3/uL (ref 0.1–0.9)
Monocytes: 8 %
Neutrophils Absolute: 4.8 10*3/uL (ref 1.4–7.0)
Neutrophils: 52 %
Platelets: 274 10*3/uL (ref 150–450)
RBC: 4.28 x10E6/uL (ref 3.77–5.28)
RDW: 13.5 % (ref 11.7–15.4)
WBC: 9.1 10*3/uL (ref 3.4–10.8)

## 2023-02-18 LAB — THYROID PANEL WITH TSH
Free Thyroxine Index: 1.4 (ref 1.2–4.9)
T3 Uptake Ratio: 27 % (ref 24–39)
T4, Total: 5.1 ug/dL (ref 4.5–12.0)
TSH: 1.7 u[IU]/mL (ref 0.450–4.500)

## 2023-02-24 ENCOUNTER — Ambulatory Visit (INDEPENDENT_AMBULATORY_CARE_PROVIDER_SITE_OTHER): Payer: Medicaid Other | Admitting: Clinical

## 2023-02-24 DIAGNOSIS — F331 Major depressive disorder, recurrent, moderate: Secondary | ICD-10-CM

## 2023-02-24 NOTE — Progress Notes (Signed)
 [name] is a 38 y.o. female presented to the scheduled appointment oriented x 5, appropriately dressed, and cooperative.  Client denied hallucinations and delusions.  Client reported on today she is under the weather.  Client reported she believes she might potentially have the flu but she has not been able to keep food down.  Client reported she is doing fine with no major complaints at this time.  Client reported she would like to follow-up in a few weeks.  Therapist instructed the client to go seek medical attention at a urgent care to get tested for the flu and get proper medication.    Collaboration of Care: Patient refused AEB no other needs requested by the client at this time.  Patient/Guardian was advised Release of Information must be obtained prior to any record release in order to collaborate their care with an outside provider. Patient/Guardian was advised if they have not already done so to contact the registration department to sign all necessary forms in order for us  to release information regarding their care.   Consent: Patient/Guardian gives verbal consent for treatment and assignment of benefits for services provided during this visit. Patient/Guardian expressed understanding and agreed to proceed.    Jackelyn Illingworth Y Mena Lienau, LCSW

## 2023-03-08 ENCOUNTER — Telehealth (HOSPITAL_COMMUNITY): Payer: Self-pay | Admitting: Psychiatry

## 2023-03-08 MED ORDER — FLUOXETINE HCL 10 MG PO CAPS: 10.0000 mg | ORAL_CAPSULE | Freq: Every morning | ORAL | 1 refills | Status: DC

## 2023-03-08 NOTE — Telephone Encounter (Signed)
 Called patient for scheduled phone check in to see how she is doing on low-dose Zoloft . Spoke with patient for approx. 7 min telephone call.  She reports she reduced Zoloft  to 25 mg about 3 weeks ago; drowsiness and sexual side effects have been better although still present. She has been taking Zoloft  at night. Given persistent side effects limiting ability to titrate medication to therapeutic dose, she is amenable to switch to alternative medication at this time. Introduced option of Prozac  given more activating side effect profile and she is amenable to trial at this time. Reviewed risks/side effects including sleep disturbance, sexual side effects, GI upset. All questions/concerns addressed.  Plan: -- STOP Zoloft  25 mg daily -- START Prozac  10 mg daily; opting to start at low dose to promote tolerability; discussed medication will likely require further titration  RTC with this writer 04/03/23.   Adell Hones, MD 03/08/23
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
## 2023-04-03 ENCOUNTER — Telehealth (HOSPITAL_COMMUNITY): Payer: Medicaid Other | Admitting: Psychiatry

## 2023-04-14 ENCOUNTER — Ambulatory Visit (INDEPENDENT_AMBULATORY_CARE_PROVIDER_SITE_OTHER): Payer: Medicaid Other | Admitting: Clinical

## 2023-04-14 DIAGNOSIS — F331 Major depressive disorder, recurrent, moderate: Secondary | ICD-10-CM

## 2023-04-14 NOTE — Progress Notes (Signed)
   THERAPIST PROGRESS NOTE Virtual Visit via Video Note  I connected with [name] on 04/14/2023 at 10:00 AM EST by a video enabled telemedicine application and verified that I am speaking with the correct person using two identifiers.  Location: Patient: work Provider: office   I discussed the limitations of evaluation and management by telemedicine and the availability of in person appointments. The patient expressed understanding and agreed to proceed.   Follow Up Instructions: I discussed the assessment and treatment plan with the patient. The patient was provided an opportunity to ask questions and all were answered. The patient agreed with the plan and demonstrated an understanding of the instructions.   The patient was advised to call back or seek an in-person evaluation if the symptoms worsen or if the condition fails to improve as anticipated.   Session Time: 25 minutes  Participation Level: Active  Behavioral Response: CasualAlertAnxious  Type of Therapy: Individual Therapy  Treatment Goals addressed: Arkie will score less than 12 on the Patient Health Questionnaire (PHQ-9)   ProgressTowards Goals: Progressing  Interventions: CBT  Summary:  [name] is a 38 y.o. female who presents for the scheduled appointment oriented x 5, appropriately dressed, and friendly. Client denied hallucinations and delusions. Client reported she is doing fairly okay. Client reported she recovered from having the flu. Client reported she has recently connected to a online womens support group on facebook. Client reported she wants to open up new avenues to express herself but she is afraid of opening up to people. Client reported she also has reservations about her ability to communicate effectively what it I she is wants to elaborate on. Client reported otherwise she has decided to move to another place. Client reported her rent is going up and the house she has been  staying in is old and not heating up well as is. Client reported she has not been taking her psychiatric medications due to side effects. Client reported she is managing okay/ Evidence of progress towards goal:  client reported 1 barrier to improving is her cognitive pattern of not feeling safe with communicating her thoughts and problems in constructive spaces such as support groups and/or with friends.  Suicidal/Homicidal: Nowithout intent/plan  Therapist Response:  Therapist began the appointment asking the client how she has been doing. Therapist used cbt to engage using active listening and positive emotional support. Therapist used cbt to ask the client to identify what is causing her distress. Therapist used cbt to engage and teach the client about assertive communication and self confidence. Therapist used CBT ask the client to identify her progress with frequency of use with coping skills with continued practice in her daily activity.    Therapist assigned the client homework to practice the skills discussed.  Plan: Return again in 4 weeks.  Diagnosis: mdd, recurrent episode, moderate with anxious distress  Collaboration of Care: Patient refused AEB none requested by the client.  Patient/Guardian was advised Release of Information must be obtained prior to any record release in order to collaborate their care with an outside provider. Patient/Guardian was advised if they have not already done so to contact the registration department to sign all necessary forms in order for [place] to release information regarding their care.   Consent: Patient/Guardian gives verbal consent for treatment and assignment of benefits for services provided during this visit. Patient/Guardian expressed understanding and agreed to proceed.   Neena Rhymes Davianna Deutschman, LCSW 04/14/2023

## 2023-04-27 ENCOUNTER — Ambulatory Visit (HOSPITAL_COMMUNITY): Payer: Medicaid Other | Admitting: Clinical

## 2023-04-27 DIAGNOSIS — F331 Major depressive disorder, recurrent, moderate: Secondary | ICD-10-CM

## 2023-04-27 NOTE — Progress Notes (Signed)
 THERAPIST PROGRESS NOTE Virtual Visit via Video Note  I connected with Madalina L Meskill on 04/27/23 at 10:00 AM EST by a video enabled telemedicine application and verified that I am speaking with the correct person using two identifiers.  Location: Patient: work Provider: office   I discussed the limitations of evaluation and management by telemedicine and the availability of in person appointments. The patient expressed understanding and agreed to proceed.   Follow Up Instructions: I discussed the assessment and treatment plan with the patient. The patient was provided an opportunity to ask questions and all were answered. The patient agreed with the plan and demonstrated an understanding of the instructions.   The patient was advised to call back or seek an in-person evaluation if the symptoms worsen or if the condition fails to improve as anticipated.   Session Time: 30 minutes  Participation Level: Active  Behavioral Response: CasualAlertAnxious  Type of Therapy: Individual Therapy  Treatment Goals addressed: Zaidee will score less than 12 on the Patient Health Questionnaire (PHQ-9)   ProgressTowards Goals: Progressing  Interventions: CBT  Summary:  Gustavia L Caserta is a 38 y.o. female who presents for the scheduled appointment oriented x 5, appropriately dressed, and friendly.  Client denied hallucinations and delusions. Client reported on today she has been feeling overwhelmed and down.  Client reported she has had a lot on her plate with moving to a new place with her and the kids.  Client reported one of her biggest stressors has been her oldest son and daughter.  Client reported her son is almost done with high school but he continues to defy her rules for the house.  Client reported he thinks that she is just trying to give him a hard time about having a life.  Client reported she believes her son struggles with not wanting to be like his dad and scared of being  out on his own.  Client reported also with her 81 year old daughter who also has a small child who lives in the home has been a challenge.  Client reported her daughter constantly ask her for things like money and to drive her around town.  Client reported her daughter sleeps in and is not putting in enough effort from what she is seen to obtain a job.  Client reported it is a lot on her to take care of her 2 youngest children and also maintain the house.  Client reported she feels like she is often sporadic been.  Client reported she is also trying to work on her own anxiety when it comes to being around people.  Client reported she is still going to reach out to the [facility] resource center to see what they have to offer. Evidence of progress towards goal: Client reported 1 positive and her perspective is working on managing her emotions without taking medications 7 days a week.   Suicidal/Homicidal: Nowithout intent/plan  Therapist Response:  Therapist began the appointment asking the client how she has been doing since last seen. Therapist used CBT to engage with active listening and positive emotional support. Therapist used CBT to engage and asked the client about stressors that have been contributing to her negative emotions. Therapist used CBT to normalize the clients emotional response within reason as well as continue to teach her about assertive communication and enforcing appropriate consequences. Therapist used CBT ask the client to identify her progress with frequency of use with coping skills with continued practice in her daily activity.  Therapist assigned client homework to follow-up with the women's resource center for additional support groups as well as practicing the skills that were discussed in the session.   Plan: Return again in 4 weeks.  Diagnosis: Major depressive disorder, recurrent episode, moderate with anxious distress  Collaboration of Care: Patient refused AEB  none requested by the client.  Patient/Guardian was advised Release of Information must be obtained prior to any record release in order to collaborate their care with an outside provider. Patient/Guardian was advised if they have not already done so to contact the registration department to sign all necessary forms in order for [place] to release information regarding their care.   Consent: Patient/Guardian gives verbal consent for treatment and assignment of benefits for services provided during this visit. Patient/Guardian expressed understanding and agreed to proceed.   Neena Rhymes Elan Mcelvain, LCSW 04/27/2023
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
## 2023-05-03 ENCOUNTER — Telehealth (INDEPENDENT_AMBULATORY_CARE_PROVIDER_SITE_OTHER): Payer: Medicaid Other | Admitting: Psychiatry

## 2023-05-03 ENCOUNTER — Encounter (HOSPITAL_COMMUNITY): Payer: Self-pay | Admitting: Psychiatry

## 2023-05-03 DIAGNOSIS — F4310 Post-traumatic stress disorder, unspecified: Secondary | ICD-10-CM | POA: Diagnosis not present

## 2023-05-03 DIAGNOSIS — F331 Major depressive disorder, recurrent, moderate: Secondary | ICD-10-CM | POA: Diagnosis not present

## 2023-05-03 NOTE — Patient Instructions (Addendum)
 Thank you for attending your appointment today.  -- We did not make any medication changes today. Please continue medications as prescribed. -- I have attached medication information about propranolol which is a medication that can be used for anxiety and in particular physical symptoms of anxiety.   Please do not make any changes to medications without first discussing with your provider. If you are experiencing a psychiatric emergency, please call 911 or present to your nearest emergency department. Additional crisis, medication management, and therapy resources are included below.  [facility]  [address] [place], [place] [address] [phone] WALK-IN URGENT CARE 24/7 FOR ANYONE [address], [place], [place]  [phone] Fax: [phone] [email or web address] *Interpreters available *Accepts all insurance and uninsured for Urgent Care needs *Accepts Medicaid and uninsured for outpatient treatment (below)      ONLY FOR [facility]  Below:    Outpatient New Patient Assessment/Therapy Walk-ins:        Monday, Wednesday, and Thursday 8am until slots are full (first come, first served)                   New Patient Psychiatry/Medication Management        Monday-Friday 8am-11am (first come, first served)               For all walk-ins we ask that you arrive by 7:15am, because patients will be seen in the order of arrival.

## 2023-05-19 ENCOUNTER — Ambulatory Visit: Payer: Medicaid Other | Attending: Nurse Practitioner | Admitting: Nurse Practitioner

## 2023-05-19 ENCOUNTER — Encounter: Payer: Self-pay | Admitting: Nurse Practitioner

## 2023-05-19 DIAGNOSIS — I10 Essential (primary) hypertension: Secondary | ICD-10-CM

## 2023-05-19 MED ORDER — VALSARTAN-HYDROCHLOROTHIAZIDE 80-12.5 MG PO TABS: 1.0000 | ORAL_TABLET | Freq: Every day | ORAL | 1 refills | Status: DC

## 2023-05-19 MED ORDER — BLOOD PRESSURE MONITOR DEVI: 0 refills | Status: DC

## 2023-05-19 NOTE — Progress Notes (Signed)
 Assessment & Plan:  [name] was seen today for medical management of chronic issues.  Diagnoses and all orders for this visit:  Primary hypertension -     valsartan-hydrochlorothiazide (DIOVAN-HCT) 80-12.5 MG tablet; Take 1 tablet by mouth daily. -     Blood Pressure Monitor DEVI; Please provide patient with insurance approved blood pressure monitor I10.0 -     CMP14+EGFR    Patient has been counseled on age-appropriate routine health concerns for screening and prevention. These are reviewed and up-to-date. Referrals have been placed accordingly. Immunizations are up-to-date or declined.    Subjective:   Chief Complaint  Patient presents with   Medical Management of Chronic Issues    [name] 38 y.o. female presents to office today for follow up to HTN   Blood pressure slightly above goal today. She Is taking Diovan HCT 80-12.5mg  daily. Had questions about what type of natural supplements and teas she is able to take with high blood pressure and not interfere with her medication.  BP Readings from Last 3 Encounters:  05/19/23 138/85  02/17/23 120/79  11/16/22 128/84      HPI  Review of Systems  Constitutional:  Negative for fever, malaise/fatigue and weight loss.  HENT: Negative.  Negative for nosebleeds.   Eyes: Negative.  Negative for blurred vision, double vision and photophobia.  Respiratory: Negative.  Negative for cough and shortness of breath.   Cardiovascular: Negative.  Negative for chest pain, palpitations and leg swelling.  Gastrointestinal: Negative.  Negative for heartburn, nausea and vomiting.  Musculoskeletal: Negative.  Negative for myalgias.  Neurological: Negative.  Negative for dizziness, focal weakness, seizures and headaches.  Psychiatric/Behavioral: Negative.  Negative for suicidal ideas.     Past Medical History:  Diagnosis Date   Abnormal Pap smear    colpo, ok since   Anemia    low iron   Cesarean delivery delivered 02/27/2013    Chlamydia    Depression    Headache(784.0)    OTC meds prn   Hypertension    Leg pain    Lower back pain    Ovarian cyst    PTSD (post-traumatic stress disorder)     Past Surgical History:  Procedure Laterality Date   CESAREAN SECTION     CESAREAN SECTION N/A 02/27/2013   Procedure: CESAREAN SECTION with tubal ligation;  Surgeon: Michael Litter, MD;  Location: [facility];  Service: Obstetrics;  Laterality: N/A;   CESAREAN SECTION     DIAGNOSTIC LAPAROSCOPY  2007   DILATION AND EVACUATION  10/27/2011   Procedure: DILATATION AND EVACUATION;  Surgeon: Michael Litter, MD;  Location: [facility];  Service: Gynecology;  Laterality: N/A;   DILATION AND EVACUATION  12/04/2011   Procedure: DILATATION AND EVACUATION;  Surgeon: Michael Litter, MD;  Location: [facility];  Service: Gynecology;  Laterality: N/A;   EXPLORATORY LAPAROTOMY  12/17/2019   EXPLORATORY LAPAROTOMY WITH LEFT OVARIAN CYSTECTOMY (Left Abdomen)   LAPAROTOMY Left 12/17/2019   Procedure: EXPLORATORY LAPAROTOMY WITH LEFT OVARIAN CYSTECTOMY;  Surgeon: Hermina Staggers, MD;  [facility];  Service: Gynecology;  Laterality: Left;   oophrectomy  2007   Right , d/t cyst   TUBAL LIGATION      Family History  Problem Relation Age of Onset   Hypertension Mother    Hypertension Father    Diabetes Paternal Aunt    Hypertension Maternal Grandmother    Cancer Maternal Grandmother    Hypertension Maternal Grandfather    Hypertension Paternal  Grandmother    Hypertension Paternal Grandfather    Mental retardation Maternal Uncle    Other Neg Hx     Social History Reviewed with no changes to be made today.   Outpatient Medications Prior to Visit  Medication Sig Dispense Refill   Iron, Ferrous Sulfate, 325 (65 Fe) MG TABS Take 325 mg by mouth daily. 90 tablet 1   valsartan-hydrochlorothiazide (DIOVAN-HCT) 80-12.5 MG tablet Take 1 tablet by mouth daily. 90 tablet 1   fluticasone (FLONASE) 50 MCG/ACT nasal spray Place 2 sprays into both  nostrils daily. (Patient not taking: Reported on 05/19/2023) 16 g 6   meloxicam (MOBIC) 7.5 MG tablet Take 1 tablet (7.5 mg total) by mouth daily. For left leg pain (Patient not taking: Reported on 05/19/2023) 30 tablet 0   FLUoxetine (PROZAC) 10 MG capsule Take 1 capsule (10 mg total) by mouth in the morning. (Patient not taking: Reported on 05/19/2023) 30 capsule 1   No facility-administered medications prior to visit.    No Known Allergies     Objective:    BP 138/85 (BP Location: Left Arm, Patient Position: Sitting, Cuff Size: Normal)   Pulse 77   Resp 20   Ht 5\' 3"  (1.6 m)   Wt 184 lb (83.5 kg)   LMP 04/24/2023 (Approximate)   SpO2 100%   BMI 32.59 kg/m  Wt Readings from Last 3 Encounters:  05/19/23 184 lb (83.5 kg)  02/17/23 175 lb 9.6 oz (79.7 kg)  11/16/22 167 lb (75.8 kg)    Physical Exam Vitals and nursing note reviewed.  Constitutional:      Appearance: She is well-developed.  HENT:     Head: Normocephalic and atraumatic.  Cardiovascular:     Rate and Rhythm: Normal rate and regular rhythm.     Heart sounds: Normal heart sounds. No murmur heard.    No friction rub. No gallop.  Pulmonary:     Effort: Pulmonary effort is normal. No tachypnea or respiratory distress.     Breath sounds: Normal breath sounds. No decreased breath sounds, wheezing, rhonchi or rales.  Chest:     Chest wall: No tenderness.  Abdominal:     General: Bowel sounds are normal.     Palpations: Abdomen is soft.  Musculoskeletal:        General: Normal range of motion.     Cervical back: Normal range of motion.  Skin:    General: Skin is warm and dry.  Neurological:     Mental Status: She is alert and oriented to person, place, and time.     Coordination: Coordination normal.  Psychiatric:        Behavior: Behavior normal. Behavior is cooperative.        Thought Content: Thought content normal.        Judgment: Judgment normal.          Patient has been counseled extensively  about nutrition and exercise as well as the importance of adherence with medications and regular follow-up. The patient was given clear instructions to go to ER or return to medical center if symptoms don't improve, worsen or new problems develop. The patient verbalized understanding.   Follow-up: Return in about 3 months (around 08/19/2023).   Claiborne Rigg, FNP-BC [facility] [place], [place] [phone]   05/19/2023, 2:20 PM

## 2023-05-20 LAB — CMP14+EGFR
ALT: 14 IU/L (ref 0–32)
AST: 25 IU/L (ref 0–40)
Albumin: 4.3 g/dL (ref 3.9–4.9)
Alkaline Phosphatase: 66 IU/L (ref 44–121)
BUN/Creatinine Ratio: 19 (ref 9–23)
BUN: 16 mg/dL (ref 6–20)
Bilirubin Total: <0.2 mg/dL (ref 0.0–1.2)
CO2: 22 mmol/L (ref 20–29)
Calcium: 9 mg/dL (ref 8.7–10.2)
Chloride: 109 mmol/L — ABNORMAL HIGH (ref 96–106)
Creatinine, Ser: 0.83 mg/dL (ref 0.57–1.00)
Globulin, Total: 2.6 g/dL (ref 1.5–4.5)
Glucose: 88 mg/dL (ref 70–99)
Potassium: 4.4 mmol/L (ref 3.5–5.2)
Sodium: 142 mmol/L (ref 134–144)
Total Protein: 6.9 g/dL (ref 6.0–8.5)
eGFR: 92 mL/min/{1.73_m2} (ref >59)

## 2023-05-24 ENCOUNTER — Encounter: Payer: Self-pay | Admitting: Nurse Practitioner
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
## 2023-07-14 ENCOUNTER — Encounter (HOSPITAL_COMMUNITY): Payer: Self-pay | Admitting: Psychiatry

## 2023-07-14 ENCOUNTER — Telehealth (HOSPITAL_COMMUNITY): Admitting: Psychiatry

## 2023-07-14 DIAGNOSIS — F331 Major depressive disorder, recurrent, moderate: Secondary | ICD-10-CM | POA: Diagnosis not present

## 2023-07-14 DIAGNOSIS — F4310 Post-traumatic stress disorder, unspecified: Secondary | ICD-10-CM | POA: Diagnosis not present

## 2023-07-14 MED ORDER — HYDROXYZINE HCL 25 MG PO TABS: 12.5000 mg | ORAL_TABLET | Freq: Two times a day (BID) | ORAL | 1 refills | Status: DC | PRN

## 2023-07-14 NOTE — Patient Instructions (Signed)
 Thank you for attending your appointment today.  -- START Atarax 12.5-50 mg twice daily as needed for overwhelm/sleep -- Continue other medications as prescribed.  Please do not make any changes to medications without first discussing with your provider. If you are experiencing a psychiatric emergency, please call 911 or present to your nearest emergency department. Additional crisis, medication management, and therapy resources are included below.  [facility]  [address], [place], [place] [address] [phone] WALK-IN URGENT CARE 24/7 FOR ANYONE [address] [place], [place]  [phone] Fax: [phone] [email or web address] *Interpreters available *Accepts all insurance and uninsured for Urgent Care needs *Accepts Medicaid and uninsured for outpatient treatment (below)      ONLY FOR [facility]  Below:    Outpatient New Patient Assessment/Therapy Walk-ins:        Monday, Wednesday, and Thursday 8am until slots are full (first come, first served)                   New Patient Psychiatry/Medication Management        Monday-Friday 8am-11am (first come, first served)               For all walk-ins we ask that you arrive by 7:15am, because patients will be seen in the order of arrival.

## 2023-08-18 ENCOUNTER — Ambulatory Visit: Attending: Nurse Practitioner | Admitting: Nurse Practitioner

## 2023-08-18 ENCOUNTER — Encounter: Payer: Self-pay | Admitting: Nurse Practitioner

## 2023-08-18 VITALS — BP 117/83 | HR 80 | Resp 19 | Ht 63.0 in | Wt 186.6 lb

## 2023-08-18 DIAGNOSIS — D508 Other iron deficiency anemias: Secondary | ICD-10-CM

## 2023-08-18 DIAGNOSIS — R0789 Other chest pain: Secondary | ICD-10-CM

## 2023-08-18 DIAGNOSIS — G8929 Other chronic pain: Secondary | ICD-10-CM

## 2023-08-18 DIAGNOSIS — R7309 Other abnormal glucose: Secondary | ICD-10-CM

## 2023-08-18 DIAGNOSIS — M25512 Pain in left shoulder: Secondary | ICD-10-CM | POA: Diagnosis not present

## 2023-08-18 DIAGNOSIS — Z833 Family history of diabetes mellitus: Secondary | ICD-10-CM

## 2023-08-18 MED ORDER — METHOCARBAMOL 500 MG PO TABS: 500.0000 mg | ORAL_TABLET | Freq: Three times a day (TID) | ORAL | 1 refills | Status: DC | PRN

## 2023-08-18 MED ORDER — DICLOFENAC SODIUM 1 % EX GEL: 4.0000 g | Freq: Four times a day (QID) | CUTANEOUS | 1 refills | Status: AC

## 2023-08-18 NOTE — Progress Notes (Signed)
 Assessment & Plan:  [name] was seen today for hypertension, shoulder pain and weight loss.  Diagnoses and all orders for this visit:  Chronic left shoulder pain States she can not afford to take off from work. I offered to write her out of work for her shoulder.  Follow-up in a few weeks and if pain unrelieved with Voltaren  gel and muscle relaxant will order EKG. -     DG Shoulder Left; Future -     methocarbamol  (ROBAXIN ) 500 MG tablet; Take 1 tablet (500 mg total) by mouth every 8 (eight) hours as needed for muscle spasms. In left shoulder -     diclofenac  Sodium (VOLTAREN ) 1 % GEL; Apply 4 g topically 4 (four) times daily.  Intermittent left-sided chest pain Check for anemia (CBC, iron  studies) as she has not been taking iron  as prescribed.  -     Iron , TIBC and Ferritin Panel -     CBC with Differential  Other iron  deficiency anemia -     Iron , TIBC and Ferritin Panel -     CBC with Differential  Family history of diabetes mellitus in sister -     Hemoglobin A1c  Elevated glucose -     Hemoglobin A1c    Patient has been counseled on age-appropriate routine health concerns for screening and prevention. These are reviewed and up-to-date. Referrals have been placed accordingly. Immunizations are up-to-date or declined.    Subjective:   Chief Complaint  Patient presents with   Hypertension   Shoulder Pain    Left side. Range of motion limited.    Weight Loss    Diet recommendations     [name] 38 y.o. female presents to office today for HTN and shoulder pain  She has a past medical history of Abnormal Pap smear, Anemia, Cesarean delivery delivered (02/27/2013), Chlamydia, Depression, Headache, Hypertension, Leg pain, Lower back pain, Ovarian cyst, and PTSD   HTN BP Readings from Last 3 Encounters:  08/18/23 117/83  05/19/23 138/85  02/17/23 120/79      Shoulder pain Reports left shoulder pain which started to increase in pain intensity over the  past month.  She has in the past reported left shoulder pain and was evaluated by me at that time 05/26/2021.  We prescribed Voltaren  gel and methocarbamol  for her shoulder pain however she could not recall if these were taken or not or effectiveness.  She has been taking tylenol  and avoiding NSAIDs due to her history of hypertension and taking blood pressure medicine.  Aggravating factors: raising left arm above shoulder, adduction, lifting heavy objects, andy lying on her left side. Pain described as sharp and dull. No pain today but pain level can get as high as 8/10.  She is left-hand dominant and her job involves repetitive movements as she works in the [employer].  She has a history of iron  deficiency anemia and reports today she has not been taking her iron  tablets every day as prescribed.  She endorses left-sided chest pain that occurs with activity or with rest.  Symptoms can last for hours and tend to go away on their own.       Review of Systems  Constitutional:  Negative for fever, malaise/fatigue and weight loss.  HENT: Negative.  Negative for nosebleeds.   Eyes: Negative.  Negative for blurred vision, double vision and photophobia.  Respiratory: Negative.  Negative for cough and shortness of breath.   Cardiovascular:  Positive for chest pain. Negative for  palpitations and leg swelling.  Gastrointestinal: Negative.  Negative for heartburn, nausea and vomiting.  Musculoskeletal:  Positive for joint pain. Negative for myalgias.  Neurological: Negative.  Negative for dizziness, focal weakness, seizures and headaches.  Psychiatric/Behavioral: Negative.  Negative for suicidal ideas.     Past Medical History:  Diagnosis Date   Abnormal Pap smear    colpo, ok since   Anemia    low iron    Cesarean delivery delivered 02/27/2013   Chlamydia    Depression    Headache(784.0)    OTC meds prn   Hypertension    Leg pain    Lower back pain    Ovarian cyst    PTSD  (post-traumatic stress disorder)     Past Surgical History:  Procedure Laterality Date   CESAREAN SECTION     CESAREAN SECTION N/A 02/27/2013   Procedure: CESAREAN SECTION with tubal ligation;  Surgeon: Ovid DELENA All, MD;  Location: [facility];  Service: Obstetrics;  Laterality: N/A;   CESAREAN SECTION     DIAGNOSTIC LAPAROSCOPY  2007   DILATION AND EVACUATION  10/27/2011   Procedure: DILATATION AND EVACUATION;  Surgeon: Ovid DELENA All, MD;  Location: [facility];  Service: Gynecology;  Laterality: N/A;   DILATION AND EVACUATION  12/04/2011   Procedure: DILATATION AND EVACUATION;  Surgeon: Ovid DELENA All, MD;  Location: [facility];  Service: Gynecology;  Laterality: N/A;   EXPLORATORY LAPAROTOMY  12/17/2019   EXPLORATORY LAPAROTOMY WITH LEFT OVARIAN CYSTECTOMY (Left Abdomen)   LAPAROTOMY Left 12/17/2019   Procedure: EXPLORATORY LAPAROTOMY WITH LEFT OVARIAN CYSTECTOMY;  Surgeon: Lorence Ozell CROME, MD;  Location: [facility];  Service: Gynecology;  Laterality: Left;   oophrectomy  2007   Right , d/t cyst   TUBAL LIGATION      Family History  Problem Relation Age of Onset   Hypertension Mother    Hypertension Father    Diabetes Paternal Aunt    Hypertension Maternal Grandmother    Cancer Maternal Grandmother    Hypertension Maternal Grandfather    Hypertension Paternal Grandmother    Hypertension Paternal Grandfather    Mental retardation Maternal Uncle    Other Neg Hx     Social History Reviewed with no changes to be made today.   Outpatient Medications Prior to Visit  Medication Sig Dispense Refill   Blood Pressure Monitor DEVI Please provide patient with insurance approved blood pressure monitor I10.0 1 each 0   hydrOXYzine  (ATARAX ) 25 MG tablet Take 0.5-2 tablets (12.5-50 mg total) by mouth 2 (two) times daily as needed (overwhelm/sleep). 60 tablet 1   Iron , Ferrous Sulfate , 325 (65 Fe) MG TABS Take 325 mg by mouth daily. 90 tablet 1   valsartan -hydrochlorothiazide  (DIOVAN -HCT) 80-12.5 MG  tablet Take 1 tablet by mouth daily. 90 tablet 1   No facility-administered medications prior to visit.    No Known Allergies     Objective:    BP 117/83 (BP Location: Left Arm, Patient Position: Sitting, Cuff Size: Normal)   Pulse 80   Resp 19   Ht 5' 3 (1.6 m)   Wt 186 lb 9.6 oz (84.6 kg)   LMP 08/18/2023 (Exact Date)   SpO2 100%   BMI 33.05 kg/m  Wt Readings from Last 3 Encounters:  08/18/23 186 lb 9.6 oz (84.6 kg)  05/19/23 184 lb (83.5 kg)  02/17/23 175 lb 9.6 oz (79.7 kg)    Physical Exam Vitals and nursing note reviewed.  Constitutional:      Appearance: She is  well-developed.  HENT:     Head: Normocephalic and atraumatic.   Cardiovascular:     Rate and Rhythm: Normal rate and regular rhythm.     Heart sounds: Normal heart sounds. No murmur heard.    No friction rub. No gallop.  Pulmonary:     Effort: Pulmonary effort is normal. No tachypnea or respiratory distress.     Breath sounds: Normal breath sounds. No decreased breath sounds, wheezing, rhonchi or rales.  Chest:     Chest wall: No tenderness.  Abdominal:     General: Bowel sounds are normal.     Palpations: Abdomen is soft.   Musculoskeletal:     Right shoulder: Tenderness present. Decreased range of motion.     Cervical back: Normal range of motion.   Skin:    General: Skin is warm and dry.   Neurological:     Mental Status: She is alert and oriented to person, place, and time.     Coordination: Coordination normal.   Psychiatric:        Behavior: Behavior normal. Behavior is cooperative.        Thought Content: Thought content normal.        Judgment: Judgment normal.          Patient has been counseled extensively about nutrition and exercise as well as the importance of adherence with medications and regular follow-up. The patient was given clear instructions to go to ER or return to medical center if symptoms don't improve, worsen or new problems develop. The patient verbalized  understanding.   Follow-up: Return in about 3 weeks (around 09/08/2023) for shoulder pain, left sided chest pain.   Haze LELON Servant, FNP-BC [facility] [place], [place] [phone]   08/18/2023, 5:06 PM

## 2023-08-19 LAB — CBC WITH DIFFERENTIAL/PLATELET
Basophils Absolute: 0.1 10*3/uL (ref 0.0–0.2)
Basos: 1 %
EOS (ABSOLUTE): 0.1 10*3/uL (ref 0.0–0.4)
Eos: 2 %
Hematocrit: 37.6 % (ref 34.0–46.6)
Hemoglobin: 11.6 g/dL (ref 11.1–15.9)
Immature Grans (Abs): 0 10*3/uL (ref 0.0–0.1)
Immature Granulocytes: 0 %
Lymphocytes Absolute: 2.3 10*3/uL (ref 0.7–3.1)
Lymphs: 36 %
MCH: 25.7 pg — ABNORMAL LOW (ref 26.6–33.0)
MCHC: 30.9 g/dL — ABNORMAL LOW (ref 31.5–35.7)
MCV: 83 fL (ref 79–97)
Monocytes Absolute: 0.6 10*3/uL (ref 0.1–0.9)
Monocytes: 10 %
Neutrophils Absolute: 3.2 10*3/uL (ref 1.4–7.0)
Neutrophils: 51 %
Platelets: 206 10*3/uL (ref 150–450)
RBC: 4.51 x10E6/uL (ref 3.77–5.28)
RDW: 14.1 % (ref 11.7–15.4)
WBC: 6.2 10*3/uL (ref 3.4–10.8)

## 2023-08-19 LAB — IRON,TIBC AND FERRITIN PANEL
Ferritin: 47 ng/mL (ref 15–150)
Iron Saturation: 12 % — ABNORMAL LOW (ref 15–55)
Iron: 41 ug/dL (ref 27–159)
Total Iron Binding Capacity: 337 ug/dL (ref 250–450)
UIBC: 296 ug/dL (ref 131–425)

## 2023-08-19 LAB — HEMOGLOBIN A1C
Est. average glucose Bld gHb Est-mCnc: 108 mg/dL
Hgb A1c MFr Bld: 5.4 % (ref 4.8–5.6)

## 2023-08-26 ENCOUNTER — Ambulatory Visit: Payer: Self-pay | Admitting: Nurse Practitioner

## 2023-08-31 ENCOUNTER — Telehealth: Admitting: Emergency Medicine

## 2023-08-31 ENCOUNTER — Ambulatory Visit: Payer: Self-pay

## 2023-08-31 DIAGNOSIS — M5440 Lumbago with sciatica, unspecified side: Secondary | ICD-10-CM

## 2023-08-31 MED ORDER — PREDNISONE 20 MG PO TABS: 40.0000 mg | ORAL_TABLET | Freq: Every day | ORAL | 0 refills | Status: DC

## 2023-08-31 MED ORDER — CYCLOBENZAPRINE HCL 10 MG PO TABS
10.0000 mg | ORAL_TABLET | Freq: Three times a day (TID) | ORAL | 0 refills | Status: AC | PRN
Start: 2023-08-31 — End: ?

## 2023-08-31 NOTE — Telephone Encounter (Signed)
 Noted

## 2023-08-31 NOTE — Patient Instructions (Signed)
  [name], thank you for joining Lamar Schlossman, PA-C for today's virtual visit.  While this provider is not your primary care provider (PCP), if your PCP is located in our provider database this encounter information will be shared with them immediately following your visit.   A [facility] MyChart account gives you access to today's visit and all your visits, tests, and labs performed at [facility]  click here if you don't have a [facility] MyChart account or go to [email or web address]  Consent: (Patient) [name] provided verbal consent for this virtual visit at the beginning of the encounter.  Current Medications:  Current Outpatient Medications:    cyclobenzaprine  (FLEXERIL ) 10 MG tablet, Take 1 tablet (10 mg total) by mouth 3 (three) times daily as needed for muscle spasms., Disp: 10 tablet, Rfl: 0   predniSONE  (DELTASONE ) 20 MG tablet, Take 2 tablets (40 mg total) by mouth daily., Disp: 10 tablet, Rfl: 0   Blood Pressure Monitor DEVI, Please provide patient with insurance approved blood pressure monitor I10.0, Disp: 1 each, Rfl: 0   diclofenac  Sodium (VOLTAREN ) 1 % GEL, Apply 4 g topically 4 (four) times daily., Disp: 100 g, Rfl: 1   hydrOXYzine  (ATARAX ) 25 MG tablet, Take 0.5-2 tablets (12.5-50 mg total) by mouth 2 (two) times daily as needed (overwhelm/sleep)., Disp: 60 tablet, Rfl: 1   Iron , Ferrous Sulfate , 325 (65 Fe) MG TABS, Take 325 mg by mouth daily., Disp: 90 tablet, Rfl: 1   valsartan -hydrochlorothiazide  (DIOVAN -HCT) 80-12.5 MG tablet, Take 1 tablet by mouth daily., Disp: 90 tablet, Rfl: 1   Medications ordered in this encounter:  Meds ordered this encounter  Medications   cyclobenzaprine  (FLEXERIL ) 10 MG tablet    Sig: Take 1 tablet (10 mg total) by mouth 3 (three) times daily as needed for muscle spasms.    Dispense:  10 tablet    Refill:  0    Supervising Provider:   LAMPTEY, PHILIP O [[id number]]   predniSONE  (DELTASONE ) 20 MG  tablet    Sig: Take 2 tablets (40 mg total) by mouth daily.    Dispense:  10 tablet    Refill:  0    Supervising Provider:   BLAISE ALEENE KIDD [[id number]]     *If you need refills on other medications prior to your next appointment, please contact your pharmacy*  Follow-Up: Call back or seek an in-person evaluation if the symptoms worsen or if the condition fails to improve as anticipated.  [facility] [phone]  Other Instructions    If you have been instructed to have an in-person evaluation today at a local Urgent Care facility, please use the link below. It will take you to a list of all of our available [facility], including address, phone number and hours of operation. Please do not delay care.  [facility]  If you or a family member do not have a primary care provider, use the link below to schedule a visit and establish care. When you choose a [facility] primary care physician or advanced practice provider, you gain a long-term partner in health. Find a Primary Care Provider  Learn more about [facility]'s in-office and virtual care options: [facility] - Get Care Now

## 2023-08-31 NOTE — Progress Notes (Signed)
 Virtual Visit Consent   [name], you are scheduled for a virtual visit with a [facility] provider today. Just as with appointments in the office, your consent must be obtained to participate. Your consent will be active for this visit and any virtual visit you may have with one of our providers in the next 365 days. If you have a MyChart account, a copy of this consent can be sent to you electronically.  As this is a virtual visit, video technology does not allow for your provider to perform a traditional examination. This may limit your provider's ability to fully assess your condition. If your provider identifies any concerns that need to be evaluated in person or the need to arrange testing (such as labs, EKG, etc.), we will make arrangements to do so. Although advances in technology are sophisticated, we cannot ensure that it will always work on either your end or our end. If the connection with a video visit is poor, the visit may have to be switched to a telephone visit. With either a video or telephone visit, we are not always able to ensure that we have a secure connection.  By engaging in this virtual visit, you consent to the provision of healthcare and authorize for your insurance to be billed (if applicable) for the services provided during this visit. Depending on your insurance coverage, you may receive a charge related to this service.  I need to obtain your verbal consent now. Are you willing to proceed with your visit today? [name] has provided verbal consent on 08/31/2023 for a virtual visit (video or telephone). Yolanda Schlossman, [name]  Date: 08/31/2023 10:15 AM   Virtual Visit via Video Note   I, [name], connected with  [name]  ([id number], 1985/12/24) on 08/31/23 at 10:15 AM EDT by a video-enabled telemedicine application and verified that I am speaking with the correct person using two identifiers.  Location: Patient: Virtual Visit  Location Patient: Home Provider: Virtual Visit Location Provider: Home Office   I discussed the limitations of evaluation and management by telemedicine and the availability of in person appointments. The patient expressed understanding and agreed to proceed.    History of Present Illness: [name] is a 38 y.o. who identifies as a female who was assigned female at birth, and is being seen today for back pain.  States that she had pain that started yesterday.  States that she was cleaning and bent over and twisted the wrong way and began feeling pain in the low back that worsened throughout the day.  States that today she had trouble getting up.  States that she had some pain into the leg.  States that a hot shower helped.  States that she still feels really stiff.  Reports worsening pain with movement.SABRA  HPI: HPI  Problems:  Patient Active Problem List   Diagnosis Date Noted   Major depressive disorder, recurrent episode, moderate with anxious distress (HCC) 01/02/2023   PTSD (post-traumatic stress disorder) 01/02/2023   Post-operative state 12/17/2019   Hypertension 08/21/2019   Cyst of left ovary 03/27/2019   Hx of ovarian cyst 07/21/2012   Previous cesarean delivery, antepartum condition or complication x 2 07/21/2012   Smoker 07/21/2012    Allergies: No Known Allergies Medications:  Current Outpatient Medications:    cyclobenzaprine  (FLEXERIL ) 10 MG tablet, Take 1 tablet (10 mg total) by mouth 3 (three) times daily as needed for muscle spasms., Disp: 10 tablet, Rfl: 0  predniSONE  (DELTASONE ) 20 MG tablet, Take 2 tablets (40 mg total) by mouth daily., Disp: 10 tablet, Rfl: 0   Blood Pressure Monitor DEVI, Please provide patient with insurance approved blood pressure monitor I10.0, Disp: 1 each, Rfl: 0   diclofenac  Sodium (VOLTAREN ) 1 % GEL, Apply 4 g topically 4 (four) times daily., Disp: 100 g, Rfl: 1   hydrOXYzine  (ATARAX ) 25 MG tablet, Take 0.5-2 tablets (12.5-50 mg total)  by mouth 2 (two) times daily as needed (overwhelm/sleep)., Disp: 60 tablet, Rfl: 1   Iron , Ferrous Sulfate , 325 (65 Fe) MG TABS, Take 325 mg by mouth daily., Disp: 90 tablet, Rfl: 1   valsartan -hydrochlorothiazide  (DIOVAN -HCT) 80-12.5 MG tablet, Take 1 tablet by mouth daily., Disp: 90 tablet, Rfl: 1  Observations/Objective: Patient is well-developed, well-nourished in no acute distress.  Resting comfortably at home.  Head is normocephalic, atraumatic.  No labored breathing.  Speech is clear and coherent with logical content.  Patient is alert and oriented at baseline.    Assessment and Plan: 1. Acute bilateral low back pain with sciatica, sciatica laterality unspecified (Primary)  Patient with symptoms consistent with low back sprain/strain.  Will treat with normal supportive care (ice, stretching, heat, walking, limiting bending/twisting).  Trial flexeril  and prednisone  for acute phase.  PCP follow-up.  Follow Up Instructions: I discussed the assessment and treatment plan with the patient. The patient was provided an opportunity to ask questions and all were answered. The patient agreed with the plan and demonstrated an understanding of the instructions.  A copy of instructions were sent to the patient via MyChart unless otherwise noted below.     The patient was advised to call back or seek an in-person evaluation if the symptoms worsen or if the condition fails to improve as anticipated.    Yolanda Schlossman, [name]

## 2023-08-31 NOTE — Telephone Encounter (Signed)
 FYI Only or Action Required?: FYI only for provider.  Patient was last seen in primary care on 08/18/2023 by Theotis Haze ORN, NP.  Called Nurse Triage reporting Back Pain.  Symptoms began yesterday.  Interventions attempted: OTC medications: Tylenol , Motrin , Rest, hydration, or home remedies, and Ice/heat application.  Symptoms are: gradually worsening.  Triage Disposition: See HCP Within 4 Hours (Or PCP Triage)  Patient/caregiver understands and will follow disposition?: Yes   Copied from CRM [id number]. Topic: Clinical - Red Word Triage >> Aug 31, 2023  9:01 AM Selinda RAMAN wrote: Red Word that prompted transfer to Nurse Triage: The patient called in stating she injured her back yesterday as she was cleaning. She says she went down the wrong way and her low back pain as progressively gotten worse. She also states the pain goes down the back of her left leg and it is making it hard for her to stand and walk. I will transfer her to E2C2 NT Reason for Disposition  [1] SEVERE back pain (e.g., excruciating, unable to do any normal activities) AND [2] not improved 2 hours after pain medicine  Answer Assessment - Initial Assessment Questions 1. ONSET: When did the pain begin? (e.g., minutes, hours, days)     08/30/23 2. LOCATION: Where does it hurt? (upper, mid or lower back)     Lower back 3. SEVERITY: How bad is the pain?  (e.g., Scale 1-10; mild, moderate, or severe)     Moderate 4. PATTERN: Is the pain constant? (e.g., yes, no; constant, intermittent)      Constant 5. RADIATION: Does the pain shoot into your legs or somewhere else?     left leg  6. CAUSE:  What do you think is causing the back pain?      strain 7. BACK OVERUSE:  Any recent lifting of heavy objects, strenuous work or exercise?     Works as a Archivist 8. MEDICINES: What have you taken so far for the pain? (e.g., nothing, acetaminophen , NSAIDS)     Tylenol , Motrin   9. NEUROLOGIC SYMPTOMS: Do you have any  weakness, numbness, or problems with bowel/bladder control?     Denies  10. OTHER SYMPTOMS: Do you have any other symptoms? (e.g., fever, abdomen pain, burning with urination, blood in urine) Denies  Protocols used: Back Pain-A-AH

## 2023-09-05 ENCOUNTER — Ambulatory Visit (INDEPENDENT_AMBULATORY_CARE_PROVIDER_SITE_OTHER): Admitting: Clinical

## 2023-09-05 DIAGNOSIS — F331 Major depressive disorder, recurrent, moderate: Secondary | ICD-10-CM | POA: Diagnosis not present

## 2023-09-05 NOTE — Progress Notes (Unsigned)
 THERAPIST PROGRESS NOTE Virtual Visit via Video Note  I connected with [name] on 09/05/2023 at 11:00 AM EDT by a video enabled telemedicine application and verified that I am speaking with the correct person using two identifiers.  Location: Patient: work Provider: office   I discussed the limitations of evaluation and management by telemedicine and the availability of in person appointments. The patient expressed understanding and agreed to proceed.   Follow Up Instructions: I discussed the assessment and treatment plan with the patient. The patient was provided an opportunity to ask questions and all were answered. The patient agreed with the plan and demonstrated an understanding of the instructions.   The patient was advised to call back or seek an in-person evaluation if the symptoms worsen or if the condition fails to improve as anticipated.   Session Time: 25 minutes  Participation Level: Active  Behavioral Response: CasualAlertAnxious and Depressed  Type of Therapy: Individual Therapy  Treatment Goals addressed: Jimma will score less than 12 on the Patient Health Questionnaire (PHQ-9)   ProgressTowards Goals: Progressing  Interventions: CBT and Supportive  Summary:  [name] is a 38 y.o. female who presents for the scheduled appointment oriented x 5, appropriately dressed, and friendly.  Client denied hallucinations or delusions. Client reported she has had a lot of ups and downs dealing with stresses.  Client reported her oldest daughter will be going back to school to complete her high school diploma with having her child.  Client reported her son graduated from high school and he is looking for work.  Client reported she herself has enrolled back into the GED program to complete her high school diploma.  Client reported she is tired of being in survival mode.  Client reported she also has a constant stress of maintaining all of her bills and  keeping food in the house.  Client reported her kids being home during the summer and being able to do more.  Client reported she has had to overextend herself beyond her means to make ends meet. Evidence of progress towards goal: Client reported 1 negative cognitive pattern of being hard on herself and feeling constantly worried about things working out. Flowsheet Row Office Visit from 08/18/2023 in [facility]  PHQ-9 Total Score 11     Suicidal/Homicidal: Nowithout intent/plan  Therapist Response:  Therapist began the appointment asking the client how she has been doing since last seen. Therapist engaged with active listening and positive emotional support. Therapist used cbt to engage and ask the client to identify the source of her negative emotions. Therapist used cbt to engage and normalized her emotional response to the stressors. Therapist used CBT to continue teaching the client about prioritization and working to challenge use without expecting perfection. Therapist used CBT ask the client to identify her progress with frequency of use with coping skills with continued practice in her daily activity.    Therapist assigned client homework to continue with working towards her goal of completing school and practicing grounding techniques.   Plan: Return again in 4 weeks.  Diagnosis: Major depressive disorder, recurrent episode, moderate with anxious distress  Collaboration of Care: Patient refused AEB none requested by the client.  Patient/Guardian was advised Release of Information must be obtained prior to any record release in order to collaborate their care with an outside provider. Patient/Guardian was advised if they have not already done so to contact  the registration department to sign all necessary forms in order for us  to release information regarding their care.   Consent: Patient/Guardian gives verbal consent  for treatment and assignment of benefits for services provided during this visit. Patient/Guardian expressed understanding and agreed to proceed.   Kregg Cihlar Y Zahari Xiang, LCSW 09/05/2023
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
## 2023-09-14 ENCOUNTER — Telehealth (HOSPITAL_COMMUNITY): Admitting: Psychiatry

## 2023-09-14 ENCOUNTER — Encounter (HOSPITAL_COMMUNITY): Payer: Self-pay | Admitting: Psychiatry

## 2023-09-14 DIAGNOSIS — F4310 Post-traumatic stress disorder, unspecified: Secondary | ICD-10-CM | POA: Diagnosis not present

## 2023-09-14 DIAGNOSIS — F330 Major depressive disorder, recurrent, mild: Secondary | ICD-10-CM

## 2023-09-14 NOTE — Patient Instructions (Signed)

## 2023-09-19 ENCOUNTER — Telehealth: Payer: Self-pay | Admitting: Nurse Practitioner

## 2023-09-19 NOTE — Telephone Encounter (Signed)
 Called patient, no answer. Left voicemail confirming upcoming appointment on 09/20/2023 at 3:10 pm Provided callback number for any questions or changes.

## 2023-09-20 ENCOUNTER — Encounter: Payer: Self-pay | Admitting: Nurse Practitioner

## 2023-09-20 ENCOUNTER — Ambulatory Visit: Attending: Nurse Practitioner | Admitting: Nurse Practitioner

## 2023-09-20 VITALS — BP 121/84 | HR 70 | Resp 19 | Ht 63.0 in | Wt 189.6 lb

## 2023-09-20 DIAGNOSIS — G8929 Other chronic pain: Secondary | ICD-10-CM | POA: Diagnosis not present

## 2023-09-20 DIAGNOSIS — M25512 Pain in left shoulder: Secondary | ICD-10-CM

## 2023-09-20 NOTE — Progress Notes (Signed)
 Assessment & Plan:  [name] was seen today for follow-up.  Diagnoses and all orders for this visit:  Chronic left shoulder pain Pain currently resolved.   Patient has been counseled on age-appropriate routine health concerns for screening and prevention. These are reviewed and up-to-date. Referrals have been placed accordingly. Immunizations are up-to-date or declined.    Subjective:   Chief Complaint  Patient presents with   Follow-up    Shoulder and left sided chest pain.    [name] 38 y.o. female presents to office today for f/u to left shoulder pain   She has a past medical history of Abnormal Pap smear, Anemia, Cesarean delivery delivered (02/27/2013), Chlamydia, Depression, Headache, Hypertension, Leg pain, Lower back pain, Ovarian cyst, and PTSD   She has a past medical history of Abnormal Pap smear, Anemia, Cesarean delivery delivered (02/27/2013), Chlamydia, Depression, Headache, Hypertension, Leg pain, Lower back pain, Ovarian cyst, and PTSD     I saw [name] here in the office on 08-18-2023 and at that time she had complaints of left shoulder pain ongoing for over a month. She also had experienced left shoulder pain a few years ago and at that time I prescribed voltaren  gel and methocarbamol . When I saw her last month she stated that she could not remember if she had ever taken the methocarbamol  or used the gel so I refilled both for her at that time. Today she states the pain in her left shoulder has significantly improved.     She is currently seeing a chiropatactor for recent back injury sustained at work. This is a Scientist, water quality comp case.     Review of Systems  Constitutional:  Negative for fever, malaise/fatigue and weight loss.  HENT: Negative.  Negative for nosebleeds.   Eyes: Negative.  Negative for blurred vision, double vision and photophobia.  Respiratory: Negative.  Negative for cough and shortness of breath.   Cardiovascular: Negative.   Negative for chest pain, palpitations and leg swelling.  Gastrointestinal: Negative.  Negative for heartburn, nausea and vomiting.  Musculoskeletal:  Positive for back pain and joint pain. Negative for myalgias.  Neurological: Negative.  Negative for dizziness, focal weakness, seizures and headaches.  Psychiatric/Behavioral: Negative.  Negative for suicidal ideas.     Past Medical History:  Diagnosis Date   Abnormal Pap smear    colpo, ok since   Anemia    low iron    Cesarean delivery delivered 02/27/2013   Chlamydia    Depression    Headache(784.0)    OTC meds prn   Hypertension    Leg pain    Lower back pain    Ovarian cyst    PTSD (post-traumatic stress disorder)     Past Surgical History:  Procedure Laterality Date   CESAREAN SECTION     CESAREAN SECTION N/A 02/27/2013   Procedure: CESAREAN SECTION with tubal ligation;  Surgeon: Ovid DELENA All, MD;  Location: [facility];  Service: Obstetrics;  Laterality: N/A;   CESAREAN SECTION     DIAGNOSTIC LAPAROSCOPY  2007   DILATION AND EVACUATION  10/27/2011   Procedure: DILATATION AND EVACUATION;  Surgeon: Ovid DELENA All, MD;  Location: [facility];  Service: Gynecology;  Laterality: N/A;   DILATION AND EVACUATION  12/04/2011   Procedure: DILATATION AND EVACUATION;  Surgeon: Ovid DELENA All, MD;  Location: [facility];  Service: Gynecology;  Laterality: N/A;   EXPLORATORY LAPAROTOMY  12/17/2019   EXPLORATORY LAPAROTOMY WITH LEFT OVARIAN CYSTECTOMY (Left Abdomen)   LAPAROTOMY Left 12/17/2019  Procedure: EXPLORATORY LAPAROTOMY WITH LEFT OVARIAN CYSTECTOMY;  Surgeon: Lorence Ozell CROME, MD;  Location: [facility];  Service: Gynecology;  Laterality: Left;   oophrectomy  2007   Right , d/t cyst   TUBAL LIGATION      Family History  Problem Relation Age of Onset   Hypertension Mother    Hypertension Father    Diabetes Paternal Aunt    Hypertension Maternal Grandmother    Cancer Maternal Grandmother    Hypertension Maternal Grandfather     Hypertension Paternal Grandmother    Hypertension Paternal Grandfather    Mental retardation Maternal Uncle    Other Neg Hx     Social History Reviewed with no changes to be made today.   Outpatient Medications Prior to Visit  Medication Sig Dispense Refill   Blood Pressure Monitor DEVI Please provide patient with insurance approved blood pressure monitor I10.0 1 each 0   cyclobenzaprine  (FLEXERIL ) 10 MG tablet Take 1 tablet (10 mg total) by mouth 3 (three) times daily as needed for muscle spasms. 10 tablet 0   diclofenac  Sodium (VOLTAREN ) 1 % GEL Apply 4 g topically 4 (four) times daily. 100 g 1   hydrOXYzine  (ATARAX ) 25 MG tablet Take 0.5-2 tablets (12.5-50 mg total) by mouth 2 (two) times daily as needed (overwhelm/sleep). 60 tablet 1   Iron , Ferrous Sulfate , 325 (65 Fe) MG TABS Take 325 mg by mouth daily. 90 tablet 1   valsartan -hydrochlorothiazide  (DIOVAN -HCT) 80-12.5 MG tablet Take 1 tablet by mouth daily. 90 tablet 1   predniSONE  (DELTASONE ) 20 MG tablet Take 2 tablets (40 mg total) by mouth daily. (Patient not taking: Reported on 09/20/2023) 10 tablet 0   No facility-administered medications prior to visit.    No Known Allergies     Objective:    BP 121/84 (BP Location: Left Arm, Patient Position: Sitting, Cuff Size: Normal)   Pulse 70   Resp 19   Ht 5' 3 (1.6 m)   Wt 189 lb 9.6 oz (86 kg)   SpO2 100%   BMI 33.59 kg/m  Wt Readings from Last 3 Encounters:  09/20/23 189 lb 9.6 oz (86 kg)  08/18/23 186 lb 9.6 oz (84.6 kg)  05/19/23 184 lb (83.5 kg)    Physical Exam Vitals and nursing note reviewed.  Constitutional:      Appearance: She is well-developed.  HENT:     Head: Normocephalic and atraumatic.  Cardiovascular:     Rate and Rhythm: Normal rate and regular rhythm.     Heart sounds: Normal heart sounds. No murmur heard.    No friction rub. No gallop.  Pulmonary:     Effort: Pulmonary effort is normal. No tachypnea or respiratory distress.     Breath  sounds: Normal breath sounds. No decreased breath sounds, wheezing, rhonchi or rales.  Chest:     Chest wall: No tenderness.  Musculoskeletal:        General: Normal range of motion.     Cervical back: Normal range of motion.  Skin:    General: Skin is warm and dry.  Neurological:     Mental Status: She is alert and oriented to person, place, and time.     Coordination: Coordination normal.  Psychiatric:        Behavior: Behavior normal. Behavior is cooperative.        Thought Content: Thought content normal.        Judgment: Judgment normal.          Patient has been  counseled extensively about nutrition and exercise as well as the importance of adherence with medications and regular follow-up. The patient was given clear instructions to go to ER or return to medical center if symptoms don't improve, worsen or new problems develop. The patient verbalized understanding.   Follow-up: Return in about 3 months (around 12/21/2023).   Haze LELON Servant, FNP-BC [facility] [place], [place] [phone]   09/20/2023, 3:35 PM

## 2023-10-26 ENCOUNTER — Ambulatory Visit (INDEPENDENT_AMBULATORY_CARE_PROVIDER_SITE_OTHER): Admitting: Clinical

## 2023-10-26 DIAGNOSIS — F330 Major depressive disorder, recurrent, mild: Secondary | ICD-10-CM | POA: Diagnosis not present

## 2023-10-26 NOTE — Progress Notes (Signed)
 THERAPIST PROGRESS NOTE Virtual Visit via Video Note  I connected with Cassey L Batterton on 10/26/2023 at 11:00 AM EDT by a video enabled telemedicine application and verified that I am speaking with the correct person using two identifiers.  Location: Patient: work Provider: office   I discussed the limitations of evaluation and management by telemedicine and the availability of in person appointments. The patient expressed understanding and agreed to proceed.   Follow Up Instructions: I discussed the assessment and treatment plan with the patient. The patient was provided an opportunity to ask questions and all were answered. The patient agreed with the plan and demonstrated an understanding of the instructions.   The patient was advised to call back or seek an in-person evaluation if the symptoms worsen or if the condition fails to improve as anticipated.   Session Time: 25 min  Participation Level: Active  Behavioral Response: CasualAlertDepressed  Type of Therapy: Individual Therapy  Treatment Goals addressed: client will engage in at least 80% of scheduled individual psychotherapy sessions  ProgressTowards Goals: Progressing  Interventions: CBT  Summary:  Cozy L Ratchford is a 38 y.o. female who presents for scheduled appointment oriented x 5, appropriately dressed, and friendly.  Client denied hallucinations and delusions. Client reported on today she has been doing fairly okay but experiencing some burnout.  Client reported after working during the week and trying to keep the house supported she has little motivation to do things that she would enjoy doing.  Client reported it makes her feel depressed when she cannot do things that she has intentions of doing. Client reported she has been trying to get caught up on paying off her credit card and other bills but when it gets off track because of other things going on she feels discouraged.  Client reported for example her  kids will tell her that there needs to be more groceries or other necessities that need to be met and she feels stretch things to make all that provided.  Client reported her son works but he gets mad when she has asked him to contribute some money to help out with bills or other groceries in the house. Client reported she understands she has to let go of perfectionism. Evidence of progress towards goal:  client reported 1 positive of being able to be mindful of signs of burnout and process how she can cope.  Suicidal/Homicidal: Nowithout intent/plan  Therapist Response:  Therapist began the appointment asking the client how she has been doing. Therapist engaged with active listening and positive emotional support. Therapist used cbt to engage and normalize her concerns with depression and how to cope with her stressors of family, home, and work. Therapist used cbt to teach the client about reframing and prioritizing her self care appropriately. Therapist used CBT ask the client to identify her progress with frequency of use with coping skills with continued practice in her daily activity.    Therapist assigned her homework to practice the skills discussed.  Plan: Return again in 4 weeks.  Diagnosis: mdd, recurrent episode, mild with anxious distress  Collaboration of Care: Patient refused AEB none requested by the client.  Patient/Guardian was advised Release of Information must be obtained prior to any record release in order to collaborate their care with an outside provider. Patient/Guardian was advised if they have not already done so to contact the registration department to sign all necessary forms in order for us  to release information regarding their care.   Consent:  Patient/Guardian gives verbal consent for treatment and assignment of benefits for services provided during this visit. Patient/Guardian expressed understanding and agreed to proceed.   Cesareo Vickrey Y Tanzie Rothschild, LCSW 10/26/2023
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
## 2023-11-09 ENCOUNTER — Encounter (HOSPITAL_COMMUNITY): Payer: Self-pay | Admitting: Psychiatry

## 2023-11-09 ENCOUNTER — Telehealth (HOSPITAL_COMMUNITY): Admitting: Psychiatry

## 2023-11-09 DIAGNOSIS — F329 Major depressive disorder, single episode, unspecified: Secondary | ICD-10-CM

## 2023-11-09 DIAGNOSIS — F330 Major depressive disorder, recurrent, mild: Secondary | ICD-10-CM

## 2023-11-09 DIAGNOSIS — F419 Anxiety disorder, unspecified: Secondary | ICD-10-CM

## 2023-11-09 DIAGNOSIS — F4310 Post-traumatic stress disorder, unspecified: Secondary | ICD-10-CM

## 2023-11-09 NOTE — Patient Instructions (Signed)

## 2023-11-16 ENCOUNTER — Ambulatory Visit (HOSPITAL_COMMUNITY): Admitting: Clinical

## 2023-12-22 ENCOUNTER — Ambulatory Visit: Admitting: Nurse Practitioner

## 2023-12-22 ENCOUNTER — Ambulatory Visit (INDEPENDENT_AMBULATORY_CARE_PROVIDER_SITE_OTHER): Admitting: Clinical

## 2023-12-22 DIAGNOSIS — F330 Major depressive disorder, recurrent, mild: Secondary | ICD-10-CM

## 2023-12-25 ENCOUNTER — Ambulatory Visit: Admitting: Nurse Practitioner

## 2023-12-31 NOTE — Progress Notes (Signed)
   THERAPIST PROGRESS NOTE Virtual Visit via Video Note  I connected with [name] on 12/22/2023 at 11:00 AM EDT by a video enabled telemedicine application and verified that I am speaking with the correct person using two identifiers.  Location: Patient: home Provider: remote office at home   I discussed the limitations of evaluation and management by telemedicine and the availability of in person appointments. The patient expressed understanding and agreed to proceed.   Follow Up Instructions: I discussed the assessment and treatment plan with the patient. The patient was provided an opportunity to ask questions and all were answered. The patient agreed with the plan and demonstrated an understanding of the instructions.   The patient was advised to call back or seek an in-person evaluation if the symptoms worsen or if the condition fails to improve as anticipated.   Session Time: 20 min  Participation Level: Active  Behavioral Response: CasualAlertAnxious  Type of Therapy: Individual Therapy  Treatment Goals addressed: client will engage in at least 80% of scheduled individual psychotherapy sessions  ProgressTowards Goals: Progressing  Interventions: CBT  Summary:  [name] is a 38 y.o. female who presents for the scheduled appointment oriented times five, appropriately dressed and friendly. Client denied hallucinations and delusions. Client reported she is doing okay but has been stressed. 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 Client reported she was doing it to show she is a good employee but she is see's they do not care. Client reported her body aches and feels tired after work. Client reported she has been deciding whether or not to stop going.  Evidence of progress towards goal:  client  identified 1 source of her negative emotions and discussed how she can resolve it.  Suicidal/Homicidal: Nowithout intent/plan  Therapist Response:  Therapist began the appointment asking the client how she has been doing. Therapist engaged with active listening and positive emotional support. Therapist used cbt to normalize her emotions within reason. Therapist engaged in brainstorming with the client about how she communicate her concerns with management. Therapist used CBT ask the client to identify her progress with frequency of use with coping skills with continued practice in her daily activity.      Plan: Return again in 4 weeks.  Diagnosis: MDD, recurrent episode, mild with anxious distress  Collaboration of Care: Patient refused AEB none requested by the client.  Patient/Guardian was advised Release of Information must be obtained prior to any record release in order to collaborate their care with an outside provider. Patient/Guardian was advised if they have not already done so to contact the registration department to sign all necessary forms in order for us  to release information regarding their care.   Consent: Patient/Guardian gives verbal consent for treatment and assignment of benefits for services provided during this visit. Patient/Guardian expressed understanding and agreed to proceed.   Dysen Edmondson Y Aniesha Haughn, LCSW 12/22/2023
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
## 2024-01-10 ENCOUNTER — Telehealth (INDEPENDENT_AMBULATORY_CARE_PROVIDER_SITE_OTHER): Admitting: Psychiatry

## 2024-01-10 ENCOUNTER — Encounter (HOSPITAL_COMMUNITY): Payer: Self-pay | Admitting: Psychiatry

## 2024-01-10 DIAGNOSIS — F4310 Post-traumatic stress disorder, unspecified: Secondary | ICD-10-CM | POA: Diagnosis not present

## 2024-01-10 DIAGNOSIS — F330 Major depressive disorder, recurrent, mild: Secondary | ICD-10-CM | POA: Diagnosis not present

## 2024-01-10 MED ORDER — HYDROXYZINE HCL 25 MG PO TABS: 12.5000 mg | ORAL_TABLET | Freq: Two times a day (BID) | ORAL | 2 refills | Status: AC | PRN

## 2024-01-10 NOTE — Patient Instructions (Signed)
 Thank you for attending your appointment today.  -- As discussed, we did not make any medication changes today and will not be scheduling follow-up with a psychiatrist as we focus on therapy. If you need refills of your hydroxyzine  in the future, please see if your primary care doctor would be comfortable prescribing this medication. If not or if you wish to discuss medications in the future, please reach out to the clinic or let your therapist know.  Please do not make any changes to medications without first discussing with your provider. If you are experiencing a psychiatric emergency, please call 911 or present to your nearest emergency department. Additional crisis, medication management, and therapy resources are included below.  [facility]  [address], [place], [place] [address] [phone] WALK-IN URGENT CARE 24/7 FOR ANYONE [address] [place], [place]  [phone] Fax: [phone] [email or web address] *Interpreters available *Accepts all insurance and uninsured for Urgent Care needs *Accepts Medicaid and uninsured for outpatient treatment (below)      ONLY FOR [facility]  Below:    Outpatient New Patient Assessment/Therapy Walk-ins:        Monday, Wednesday, and Thursday 8am until slots are full (first come, first served)                   New Patient Psychiatry/Medication Management        Monday-Friday 8am-11am (first come, first served)               For all walk-ins we ask that you arrive by 7:15am, because patients will be seen in the order of arrival.

## 2024-01-11 ENCOUNTER — Ambulatory Visit (INDEPENDENT_AMBULATORY_CARE_PROVIDER_SITE_OTHER): Admitting: Clinical

## 2024-01-11 DIAGNOSIS — F330 Major depressive disorder, recurrent, mild: Secondary | ICD-10-CM

## 2024-01-11 NOTE — Progress Notes (Signed)
   THERAPIST PROGRESS NOTE Virtual Visit via Video Note  I connected with [name] on 01/11/2024 at 11:00 AM EST by a video enabled telemedicine application and verified that I am speaking with the correct person using two identifiers.  Location: Patient: work Provider: office   I discussed the limitations of evaluation and management by telemedicine and the availability of in person appointments. The patient expressed understanding and agreed to proceed.   Follow Up Instructions: I discussed the assessment and treatment plan with the patient. The patient was provided an opportunity to ask questions and all were answered. The patient agreed with the plan and demonstrated an understanding of the instructions.   The patient was advised to call back or seek an in-person evaluation if the symptoms worsen or if the condition fails to improve as anticipated.   Session Time: 25 min  Participation Level: Active  Behavioral Response: CasualAlertDepressed  Type of Therapy: Individual Therapy  Treatment Goals addressed: client will engage in at least 80% of scheduled individual psychotherapy sessions  ProgressTowards Goals: Progressing  Interventions: CBT  Summary:  [name] is a 38 y.o. female who presents for the scheduled appointment oriented times five, appropriately dressed and friendly. Client denied hallucinations and delusions. Client reported on today she is doing fairly okay.  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  Client reported for example last night she got home at midnight.  Client reported she has been so tired after work that she has not had the energy or motivation to do her hobbies or work on schoolwork.  Client reported she is been feeling like she is behind although she is not.  Client reported she still has the notion of getting her kids to contribute some money to the household every  month so it can help lighten her load with working overtime to pay bills and she can use that time to focus on her schoolwork.  Evidence of progress towards goal:  client identifies 2 cognitive patterns that impede her progress.  Suicidal/Homicidal: Nowithout intent/plan  Therapist Response:  Therapist began the appointment asking how she has been doing. Therapist engaged using active listening and positive emotional support. Therapist used cbt to engage and ask her to describe the source of her negative emotions and how she attempted to manage. Therapist used cbt to teach her about reframing negative thoughts and practical problem solving steps. Therapist used CBT ask the client to identify her progress with frequency of use with coping skills with continued practice in her daily activity.    Therapist assigned her homework to practice self care.   Plan: Return again in 4 weeks.  Diagnosis: mdd, recurrent mild with anxious distress  Collaboration of Care: Patient refused AEB none requested.  Patient/Guardian was advised Release of Information must be obtained prior to any record release in order to collaborate their care with an outside provider. Patient/Guardian was advised if they have not already done so to contact the registration department to sign all necessary forms in order for us  to release information regarding their care.   Consent: Patient/Guardian gives verbal consent for treatment and assignment of benefits for services provided during this visit. Patient/Guardian expressed understanding and agreed to proceed.   Loletta Harper Y Isidoro Santillana, LCSW 01/11/2024

## 2024-01-31 ENCOUNTER — Ambulatory Visit: Attending: Nurse Practitioner | Admitting: Nurse Practitioner

## 2024-01-31 ENCOUNTER — Encounter: Payer: Self-pay | Admitting: Nurse Practitioner

## 2024-01-31 VITALS — BP 136/84 | HR 72 | Resp 19 | Ht 63.0 in | Wt 193.4 lb

## 2024-01-31 DIAGNOSIS — Z124 Encounter for screening for malignant neoplasm of cervix: Secondary | ICD-10-CM

## 2024-01-31 DIAGNOSIS — I10 Essential (primary) hypertension: Secondary | ICD-10-CM | POA: Diagnosis not present

## 2024-01-31 DIAGNOSIS — E669 Obesity, unspecified: Secondary | ICD-10-CM

## 2024-01-31 MED ORDER — TELMISARTAN-HCTZ 80-25 MG PO TABS: 1.0000 | ORAL_TABLET | Freq: Every day | ORAL | 1 refills | Status: AC

## 2024-01-31 MED ORDER — BLOOD PRESSURE MONITOR DEVI: 0 refills | Status: AC

## 2024-01-31 NOTE — Progress Notes (Unsigned)
 Assessment & Plan:  [name] was seen today for medical management of chronic issues and obesity.  Diagnoses and all orders for this visit:  Primary hypertension -     Blood Pressure Monitor DEVI; Please provide patient with insurance approved blood pressure monitor I10.0 (Patient not taking: Reported on 01/31/2024)    Patient has been counseled on age-appropriate routine health concerns for screening and prevention. These are reviewed and up-to-date. Referrals have been placed accordingly. Immunizations are up-to-date or declined.    Subjective:   Chief Complaint  Patient presents with   Medical Management of Chronic Issues   Obesity    Weight loss options.    [name] 38 y.o. female presents to office today   BP Readings from Last 3 Encounters:  01/31/24 136/84  09/20/23 121/84  08/18/23 117/83    Weight loss Getting [id number] steps   ROS  Past Medical History:  Diagnosis Date   Abnormal Pap smear    colpo, ok since   Anemia    low iron    Cesarean delivery delivered 02/27/2013   Chlamydia    Depression    Headache(784.0)    OTC meds prn   Hypertension    Leg pain    Lower back pain    Ovarian cyst    PTSD (post-traumatic stress disorder)     Past Surgical History:  Procedure Laterality Date   CESAREAN SECTION     CESAREAN SECTION N/A 02/27/2013   Procedure: CESAREAN SECTION with tubal ligation;  Surgeon: Ovid DELENA All, MD;  Location: [facility];  Service: Obstetrics;  Laterality: N/A;   CESAREAN SECTION     DIAGNOSTIC LAPAROSCOPY  2007   DILATION AND EVACUATION  10/27/2011   Procedure: DILATATION AND EVACUATION;  Surgeon: Ovid DELENA All, MD;  Location: [facility];  Service: Gynecology;  Laterality: N/A;   DILATION AND EVACUATION  12/04/2011   Procedure: DILATATION AND EVACUATION;  Surgeon: Ovid DELENA All, MD;  Location: [facility];  Service: Gynecology;  Laterality: N/A;   EXPLORATORY LAPAROTOMY  12/17/2019   EXPLORATORY LAPAROTOMY WITH LEFT OVARIAN  CYSTECTOMY (Left Abdomen)   LAPAROTOMY Left 12/17/2019   Procedure: EXPLORATORY LAPAROTOMY WITH LEFT OVARIAN CYSTECTOMY;  Surgeon: Lorence Ozell CROME, MD;  Location: [facility];  Service: Gynecology;  Laterality: Left;   oophrectomy  2007   Right , d/t cyst   TUBAL LIGATION      Family History  Problem Relation Age of Onset   Hypertension Mother    Hypertension Father    Diabetes Paternal Aunt    Hypertension Maternal Grandmother    Cancer Maternal Grandmother    Hypertension Maternal Grandfather    Hypertension Paternal Grandmother    Hypertension Paternal Grandfather    Mental retardation Maternal Uncle    Other Neg Hx     Social History Reviewed with no changes to be made today.   Outpatient Medications Prior to Visit  Medication Sig Dispense Refill   cyclobenzaprine  (FLEXERIL ) 10 MG tablet Take 1 tablet (10 mg total) by mouth 3 (three) times daily as needed for muscle spasms. 10 tablet 0   diclofenac  Sodium (VOLTAREN ) 1 % GEL Apply 4 g topically 4 (four) times daily. 100 g 1   hydrOXYzine  (ATARAX ) 25 MG tablet Take 0.5-2 tablets (12.5-50 mg total) by mouth 2 (two) times daily as needed (anxiety or sleep). 60 tablet 2   Iron , Ferrous Sulfate , 325 (65 Fe) MG TABS Take 325 mg by mouth daily. 90 tablet 1   valsartan -hydrochlorothiazide  (DIOVAN -HCT)  80-12.5 MG tablet Take 1 tablet by mouth daily. 90 tablet 1   Blood Pressure Monitor DEVI Please provide patient with insurance approved blood pressure monitor I10.0 1 each 0   No facility-administered medications prior to visit.    No Known Allergies     Objective:    BP 136/84 (BP Location: Left Arm, Patient Position: Sitting, Cuff Size: Normal)   Pulse 72   Resp 19   Ht 5' 3 (1.6 m)   Wt 193 lb 6.4 oz (87.7 kg)   SpO2 100%   BMI 34.26 kg/m  Wt Readings from Last 3 Encounters:  01/31/24 193 lb 6.4 oz (87.7 kg)  09/20/23 189 lb 9.6 oz (86 kg)  08/18/23 186 lb 9.6 oz (84.6 kg)    Physical Exam       Patient has been  counseled extensively about nutrition and exercise as well as the importance of adherence with medications and regular follow-up. The patient was given clear instructions to go to ER or return to medical center if symptoms don't improve, worsen or new problems develop. The patient verbalized understanding.   Follow-up: No follow-ups on file.   Haze LELON Servant, FNP-BC [facility] [place], [place] [phone]   01/31/2024, 3:47 PM

## 2024-02-01 ENCOUNTER — Ambulatory Visit (INDEPENDENT_AMBULATORY_CARE_PROVIDER_SITE_OTHER): Admitting: Clinical

## 2024-02-01 DIAGNOSIS — F331 Major depressive disorder, recurrent, moderate: Secondary | ICD-10-CM | POA: Diagnosis not present

## 2024-02-01 DIAGNOSIS — F330 Major depressive disorder, recurrent, mild: Secondary | ICD-10-CM

## 2024-02-01 NOTE — Progress Notes (Signed)
 THERAPIST PROGRESS NOTE Virtual Visit via Video Note  I connected with Phoebie L Dhillon on 02/01/2024 at 11:00 AM EST by a video enabled telemedicine application and verified that I am speaking with the correct person using two identifiers.  Location: Patient: work Provider: office   I discussed the limitations of evaluation and management by telemedicine and the availability of in person appointments. The patient expressed understanding and agreed to proceed.   Follow Up Instructions: I discussed the assessment and treatment plan with the patient. The patient was provided an opportunity to ask questions and all were answered. The patient agreed with the plan and demonstrated an understanding of the instructions.   The patient was advised to call back or seek an in-person evaluation if the symptoms worsen or if the condition fails to improve as anticipated.   Session Time: 25 min  Participation Level: Active  Behavioral Response: CasualAlertEuthymic  Type of Therapy: Individual Therapy  Treatment Goals addressed: Client will engage in at least 80% of scheduled individual psychotherapy sessions  ProgressTowards Goals: Progressing  Interventions: CBT  Summary:  Uniqua L Stieber is a 38 y.o. female who presents for the scheduled appointment oriented x 5, appropriately dressed, and friendly. Client denied hallucinations and delusions. Client reported she is doing pretty well.  Client reported she has been working on her GED and she will be testing soon.  Client reported things at work have not changed.  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  Client reported she has noticed that she has put on weight and is now at 193 pounds.  Client reported her doctor told her there are options that can be provided to her for weight loss assistance medications because it could interact with the medicine that she is already taking.  Client  reported her doctor did state that her cortisol is probably high and she needs to work on stress management and making sure she is getting enough sleep to help aid in weight loss.  Client reported she has also been trying to get the point across her kids to work on keeping the home clean while they are at home and getting them to find jobs.  Client reported she still feels stretched been trying to meet everybody's needs.  Evidence of progress towards goal: Client reported 1 positive open making a change in her cognitive patterns provide switching out saying she is trying to she is doing better.  Suicidal/Homicidal: Nowithout intent/plan  Therapist Response:  Therapist began the appointment asking the client how she has been doing since last seen. Therapist engaged with active listening and positive emotional support. Therapist used CBT to give the client time to elaborate on her thoughts and feelings pertaining to school, work, and family. Therapist used CBT to normalize her emotional response. Therapist used CBT to ask the client to identify how she is changing her cognitive patterns to help with her motivation to stay persisting and obtaining her goals. Therapist used CBT ask the client to identify her progress with frequency of use with coping skills with continued practice in her daily activity.    Therapist assigned client homework to practice self-care and getting enough rest.   Plan: Return again in 3 weeks.  Diagnosis: Major depressive disorder, recurrent episode, moderate with anxious distress  Collaboration of Care: Patient refused AEB none requested by the client.  Patient/Guardian was advised Release of Information must be obtained prior to any record  release in order to collaborate their care with an outside provider. Patient/Guardian was advised if they have not already done so to contact the registration department to sign all necessary forms in order for us  to release  information regarding their care.   Consent: Patient/Guardian gives verbal consent for treatment and assignment of benefits for services provided during this visit. Patient/Guardian expressed understanding and agreed to proceed.   Merle Whitehorn Y Avaleen Brownley, LCSW 02/01/2024

## 2024-02-08 ENCOUNTER — Telehealth: Payer: Self-pay | Admitting: Nurse Practitioner

## 2024-02-08 NOTE — Telephone Encounter (Signed)
 Copied from CRM #[id number]. Topic: Clinical - Medication Prior Auth >> Feb 08, 2024 11:40 AM Dedra B wrote:  Reason for CRM: Pt said the pharmacy is requesting a PA for telmisartan -hydrochlorothiazide  (MICARDIS  HCT) 80-25 MG tablet. Pt said she is down to 2 tablets of her previous dose.

## 2024-02-09 ENCOUNTER — Other Ambulatory Visit: Payer: Self-pay | Admitting: Nurse Practitioner

## 2024-02-09 MED ORDER — VALSARTAN 160 MG PO TABS: 160.0000 mg | ORAL_TABLET | Freq: Every day | ORAL | 3 refills | Status: AC

## 2024-02-09 NOTE — Progress Notes (Signed)
 BP Readings from Last 3 Encounters:  01/31/24 136/84  09/20/23 121/84  08/18/23 117/83

## 2024-02-09 NOTE — Telephone Encounter (Signed)
 Message sent through my chart

## 2024-02-09 NOTE — Telephone Encounter (Signed)
Switch to valsartan

## 2024-02-12 ENCOUNTER — Ambulatory Visit (HOSPITAL_COMMUNITY): Admitting: Clinical

## 2024-02-12 DIAGNOSIS — F330 Major depressive disorder, recurrent, mild: Secondary | ICD-10-CM

## 2024-02-12 NOTE — Progress Notes (Signed)
" ° °  THERAPIST PROGRESS NOTE Virtual Visit via Video Note  I connected with [name] on 02/12/2024 at 11:00 AM EST by a video enabled telemedicine application and verified that I am speaking with the correct person using two identifiers.  Location: Patient: work Provider: office   I discussed the limitations of evaluation and management by telemedicine and the availability of in person appointments. The patient expressed understanding and agreed to proceed.   Follow Up Instructions: I discussed the assessment and treatment plan with the patient. The patient was provided an opportunity to ask questions and all were answered. The patient agreed with the plan and demonstrated an understanding of the instructions.   The patient was advised to call back or seek an in-person evaluation if the symptoms worsen or if the condition fails to improve as anticipated.  Session Time: 25 min  Participation Level: Active  Behavioral Response: CasualAlertAnxious  Type of Therapy: Individual Therapy  Treatment Goals addressed: client will engage in at least 80% of scheduled individual psychotherapy sessions  ProgressTowards Goals: Progressing  Interventions: CBT and Supportive  Summary:  [name] is a 38 y.o. female who presents for the scheduled appointment oriented times five, appropriately dressed and friendly. Client denied hallucinations and delusions. Client reported on today she is doing fairly okay. Client reported she has been working extra time because they are short staffed at work. Client reported she thinks she will talk to her manager about a raise for all the extra work she is doing. Client reported she is tired and has been talking to her two older kids about them getting work so she is not spread so thin financially. Client reported an issue with socializing. Client reported her friends get her to go out but she struggles with looking unapproachable and worries about  letting her guard down. Client reported she is also newly talking to a female friend with the potential of dating. Client reported she does not know how to let her guard down some and enjoy meeting new people. Evidence of progress towards goal:  client 1 behavioral and cognitive pattern of lacking in communication skills that negatively impact her ability to make new friends.  Suicidal/Homicidal: Nowithout intent/plan  Therapist Response:  Therapist began the appointment asking how she has been doing. Therapist engaged using active listening and positive emotional support. Therapist used cbt to engage and ask her about challenges. Therapist used cbt to engage and teach her about how to appropriately interact with people within her boundaries. Therapist used CBT ask the client to identify her progress with frequency of use with coping skills with continued practice in her daily activity.    Therapist assigned her homework to practice self care.    Plan: Return again in 4 weeks.  Diagnosis: mdd, recurrent mild with anxious distress  Collaboration of Care: Patient refused AEB none requested by the client.  Patient/Guardian was advised Release of Information must be obtained prior to any record release in order to collaborate their care with an outside provider. Patient/Guardian was advised if they have not already done so to contact the registration department to sign all necessary forms in order for us  to release information regarding their care.   Consent: Patient/Guardian gives verbal consent for treatment and assignment of benefits for services provided during this visit. Patient/Guardian expressed understanding and agreed to proceed.   Yolanda Strehle Y Shoaib Siefker, LCSW 02/12/2024  "

## 2024-03-10 ENCOUNTER — Telehealth: Admitting: Family

## 2024-03-10 DIAGNOSIS — H109 Unspecified conjunctivitis: Secondary | ICD-10-CM

## 2024-03-10 MED ORDER — POLYMYXIN B-TRIMETHOPRIM 10000-0.1 UNIT/ML-% OP SOLN: 1.0000 [drp] | Freq: Four times a day (QID) | OPHTHALMIC | 0 refills | Status: AC

## 2024-03-10 NOTE — Patient Instructions (Signed)
 Bacterial Conjunctivitis, Adult Bacterial conjunctivitis is an infection of the clear membrane that covers the white part of the eye and the inner surface of the eyelid (conjunctiva). When the blood vessels in the conjunctiva become inflamed, the eye becomes red or pink. The eye often feels irritated or itchy. Bacterial conjunctivitis spreads easily from person to person (is contagious). It also spreads easily from one eye to the other eye. What are the causes? This condition is caused by bacteria. You may get the infection if you come into close contact with: A person who is infected with the bacteria. Items that are contaminated with the bacteria, such as a face towel, contact lens solution, or eye makeup. What increases the risk? You are more likely to develop this condition if: You are exposed to other people who have the infection. You wear contact lenses. You have a sinus infection. You have had a recent eye injury or surgery. You have a weak body defense system (immune system). You have a medical condition that causes dry eyes. What are the signs or symptoms? Symptoms of this condition include: Thick, yellowish discharge from the eye. This may turn into a crust on the eyelid overnight and cause your eyelids to stick together. Tearing or watery eyes. Itchy eyes. Burning feeling in your eyes. Eye redness. Swollen eyelids. Blurred vision. How is this diagnosed? This condition is diagnosed based on your symptoms and medical history. Your health care provider may also take a sample of discharge from your eye to find the cause of your infection. How is this treated? This condition may be treated with: Antibiotic eye drops or ointment to clear the infection more quickly and prevent the spread of infection to others. Antibiotic medicines taken by mouth (orally) to treat infections that do not respond to drops or ointments or that last longer than 10 days. Cool, wet cloths (cool  compresses) placed on the eyes. Artificial tears applied 2-6 times a day. Follow these instructions at home: Medicines Take or apply your antibiotic medicine as told by your health care provider. Do not stop using the antibiotic, even if your condition improves, unless directed by your health care provider. Take or apply over-the-counter and prescription medicines only as told by your health care provider. Be very careful to avoid touching the edge of your eyelid with the eye-drop bottle or the ointment tube when you apply medicines to the affected eye. This will keep you from spreading the infection to your other eye or to other people. Managing discomfort Gently wipe away any drainage from your eye with a warm, wet washcloth or a cotton ball. Apply a clean, cool compress to your eye for 10-20 minutes, 3-4 times a day. General instructions Do not wear contact lenses until the inflammation is gone and your health care provider says it is safe to wear them again. Ask your health care provider how to sterilize or replace your contact lenses before you use them again. Wear glasses until you can resume wearing contact lenses. Avoid wearing eye makeup until the inflammation is gone. Throw away any old eye cosmetics that may be contaminated. Change or wash your pillowcase every day. Do not share towels or washcloths. This may spread the infection. Wash your hands often with soap and water for at least 20 seconds and especially before touching your face or eyes. Use paper towels to dry your hands. Avoid touching or rubbing your eyes. Do not drive or use heavy machinery if your vision is blurred. Contact  a health care provider if: You have a fever. Your symptoms do not get better after 10 days. Get help right away if: You have a fever and your symptoms suddenly get worse. You have severe pain when you move your eye. You have facial pain, redness, or swelling. You have a sudden loss of  vision. Summary Bacterial conjunctivitis is an infection of the clear membrane that covers the white part of the eye and the inner surface of the eyelid (conjunctiva). Bacterial conjunctivitis spreads easily from eye to eye and from person to person (is contagious). Wash your hands often with soap and water for at least 20 seconds and especially before touching your face or eyes. Use paper towels to dry your hands. Take or apply your antibiotic medicine as told by your health care provider. Do not stop using the antibiotic even if your condition improves. Contact a health care provider if you have a fever or if your symptoms do not get better after 10 days. Get help right away if you have a sudden loss of vision. This information is not intended to replace advice given to you by your health care provider. Make sure you discuss any questions you have with your health care provider. Document Revised: 05/20/2020 Document Reviewed: 05/20/2020 Elsevier Patient Education  2024 [employer].

## 2024-03-10 NOTE — Progress Notes (Signed)
 " Virtual Visit Consent   [name], you are scheduled for a virtual visit with a [facility] provider today. Just as with appointments in the office, your consent must be obtained to participate. Your consent will be active for this visit and any virtual visit you may have with one of our providers in the next 365 days. If you have a MyChart account, a copy of this consent can be sent to you electronically.  As this is a virtual visit, video technology does not allow for your provider to perform a traditional examination. This may limit your provider's ability to fully assess your condition. If your provider identifies any concerns that need to be evaluated in person or the need to arrange testing (such as labs, EKG, etc.), we will make arrangements to do so. Although advances in technology are sophisticated, we cannot ensure that it will always work on either your end or our end. If the connection with a video visit is poor, the visit may have to be switched to a telephone visit. With either a video or telephone visit, we are not always able to ensure that we have a secure connection.  By engaging in this virtual visit, you consent to the provision of healthcare and authorize for your insurance to be billed (if applicable) for the services provided during this visit. Depending on your insurance coverage, you may receive a charge related to this service.  I need to obtain your verbal consent now. Are you willing to proceed with your visit today? [name] has provided verbal consent on 03/10/2024 for a virtual visit (video or telephone). Bari Learn, FNP  Date: 03/10/2024 4:04 PM   Virtual Visit via Video Note   I, Bari Learn, connected with  [name]  ([id number], 1985-12-18) on 03/10/24 at  4:00 PM EST by a video-enabled telemedicine application and verified that I am speaking with the correct person using two identifiers.  Location: Patient: Virtual Visit Location  Patient: Home Provider: Virtual Visit Location Provider: Home Office   I discussed the limitations of evaluation and management by telemedicine and the availability of in person appointments. The patient expressed understanding and agreed to proceed.    History of Present Illness: [name] is a 39 y.o. who identifies as a female who was assigned female at birth, and is being seen today for erythemas of left eye that started this morning.  HPI: Conjunctivitis  The current episode started today. The onset was sudden. The problem occurs occasionally. Associated symptoms include eye itching, congestion, ear pain (right), eye discharge, eye pain (slightly) and eye redness. Pertinent negatives include no photophobia, no ear discharge and no headaches.    Problems:  Patient Active Problem List   Diagnosis Date Noted   Major depressive disorder, recurrent episode, mild with anxious distress 01/02/2023   PTSD (post-traumatic stress disorder) 01/02/2023   Post-operative state 12/17/2019   Hypertension 08/21/2019   Cyst of left ovary 03/27/2019   Hx of ovarian cyst 07/21/2012   Previous cesarean delivery, antepartum condition or complication x 2 07/21/2012   Smoker 07/21/2012    Allergies: Allergies[1] Medications: Current Medications[2]  Observations/Objective: Patient is well-developed, well-nourished in no acute distress.  Resting comfortably  at home.  Head is normocephalic, atraumatic.  No labored breathing.  Speech is clear and coherent with logical content.  Patient is alert and oriented at baseline.  Left eye slightly erythemas, dry crusted  Assessment and Plan: 1. Bacterial conjunctivitis (Primary) - trimethoprim -polymyxin  b (POLYTRIM ) ophthalmic solution; Place 1 drop into the left eye every 6 (six) hours.  Dispense: 10 mL; Refill: 0  Warm compresses  Good hand hygiene  Avoid rubbing eye Follow up if symptoms worsen or do not improve   Follow Up Instructions: I  discussed the assessment and treatment plan with the patient. The patient was provided an opportunity to ask questions and all were answered. The patient agreed with the plan and demonstrated an understanding of the instructions.  A copy of instructions were sent to the patient via MyChart unless otherwise noted below.     The patient was advised to call back or seek an in-person evaluation if the symptoms worsen or if the condition fails to improve as anticipated.    Bari Learn, FNP    [1] No Known Allergies [2]  Current Outpatient Medications:    trimethoprim -polymyxin b  (POLYTRIM ) ophthalmic solution, Place 1 drop into the left eye every 6 (six) hours., Disp: 10 mL, Rfl: 0   Blood Pressure Monitor DEVI, Please provide patient with insurance approved blood pressure monitor I10.0 (Patient not taking: Reported on 01/31/2024), Disp: 1 each, Rfl: 0   cyclobenzaprine  (FLEXERIL ) 10 MG tablet, Take 1 tablet (10 mg total) by mouth 3 (three) times daily as needed for muscle spasms., Disp: 10 tablet, Rfl: 0   diclofenac  Sodium (VOLTAREN ) 1 % GEL, Apply 4 g topically 4 (four) times daily., Disp: 100 g, Rfl: 1   hydrOXYzine  (ATARAX ) 25 MG tablet, Take 0.5-2 tablets (12.5-50 mg total) by mouth 2 (two) times daily as needed (anxiety or sleep)., Disp: 60 tablet, Rfl: 2   Iron , Ferrous Sulfate , 325 (65 Fe) MG TABS, Take 325 mg by mouth daily., Disp: 90 tablet, Rfl: 1   valsartan  (DIOVAN ) 160 MG tablet, Take 1 tablet (160 mg total) by mouth daily., Disp: 90 tablet, Rfl: 3  "

## 2024-03-11 ENCOUNTER — Telehealth: Admitting: Physician Assistant

## 2024-03-11 DIAGNOSIS — R3989 Other symptoms and signs involving the genitourinary system: Secondary | ICD-10-CM | POA: Diagnosis not present

## 2024-03-11 DIAGNOSIS — B379 Candidiasis, unspecified: Secondary | ICD-10-CM

## 2024-03-11 DIAGNOSIS — T3695XA Adverse effect of unspecified systemic antibiotic, initial encounter: Secondary | ICD-10-CM | POA: Diagnosis not present

## 2024-03-11 MED ORDER — FLUCONAZOLE 150 MG PO TABS: 150.0000 mg | ORAL_TABLET | ORAL | 0 refills | Status: AC | PRN

## 2024-03-11 MED ORDER — NITROFURANTOIN MONOHYD MACRO 100 MG PO CAPS: 100.0000 mg | ORAL_CAPSULE | Freq: Two times a day (BID) | ORAL | 0 refills | Status: AC

## 2024-03-11 NOTE — Progress Notes (Signed)
 " Virtual Visit Consent   [name], you are scheduled for a virtual visit with a [facility] provider today. Just as with appointments in the office, your consent must be obtained to participate. Your consent will be active for this visit and any virtual visit you may have with one of our providers in the next 365 days. If you have a MyChart account, a copy of this consent can be sent to you electronically.  As this is a virtual visit, video technology does not allow for your provider to perform a traditional examination. This may limit your provider's ability to fully assess your condition. If your provider identifies any concerns that need to be evaluated in person or the need to arrange testing (such as labs, EKG, etc.), we will make arrangements to do so. Although advances in technology are sophisticated, we cannot ensure that it will always work on either your end or our end. If the connection with a video visit is poor, the visit may have to be switched to a telephone visit. With either a video or telephone visit, we are not always able to ensure that we have a secure connection.  By engaging in this virtual visit, you consent to the provision of healthcare and authorize for your insurance to be billed (if applicable) for the services provided during this visit. Depending on your insurance coverage, you may receive a charge related to this service.  I need to obtain your verbal consent now. Are you willing to proceed with your visit today? [name] has provided verbal consent on 03/11/2024 for a virtual visit (video or telephone). Delon CHRISTELLA Dickinson, PA-C  Date: 03/11/2024 2:12 PM   Virtual Visit via Video Note   I, Delon CHRISTELLA Dickinson, connected with  [name]  ([id number], 12/12/1985) on 03/11/24 at  2:00 PM EST by a video-enabled telemedicine application and verified that I am speaking with the correct person using two identifiers.  Location: Patient: Virtual Visit  Location Patient: Home Provider: Virtual Visit Location Provider: Home Office   I discussed the limitations of evaluation and management by telemedicine and the availability of in person appointments. The patient expressed understanding and agreed to proceed.    History of Present Illness: [name] is a 39 y.o. who identifies as a female who was assigned female at birth, and is being seen today for dysuria.  HPI: Urinary Tract Infection  This is a new problem. The current episode started today. The problem occurs every urination. The problem has been unchanged. The quality of the pain is described as burning. The pain is mild. There has been no fever. Associated symptoms include frequency and urgency (occasional). Pertinent negatives include no chills, discharge, flank pain, hematuria, hesitancy, nausea, possible pregnancy, sweats or vomiting. Associated symptoms comments: Cloudy, stronger odor. She has tried nothing for the symptoms. The treatment provided no relief.    Problems:  Patient Active Problem List   Diagnosis Date Noted   Major depressive disorder, recurrent episode, mild with anxious distress 01/02/2023   PTSD (post-traumatic stress disorder) 01/02/2023   Post-operative state 12/17/2019   Hypertension 08/21/2019   Cyst of left ovary 03/27/2019   Hx of ovarian cyst 07/21/2012   Previous cesarean delivery, antepartum condition or complication x 2 07/21/2012   Smoker 07/21/2012    Allergies: Allergies[1] Medications: Current Medications[2]  Observations/Objective: Patient is well-developed, well-nourished in no acute distress.  Resting comfortably at home.  Head is normocephalic, atraumatic.  No labored breathing.  Speech is clear and coherent with logical content.  Patient is alert and oriented at baseline.    Assessment and Plan: 1. Suspected UTI (Primary) - nitrofurantoin , macrocrystal-monohydrate, (MACROBID ) 100 MG capsule; Take 1 capsule (100 mg total) by  mouth 2 (two) times daily.  Dispense: 10 capsule; Refill: 0  2. Antibiotic-induced yeast infection - fluconazole  (DIFLUCAN ) 150 MG tablet; Take 1 tablet (150 mg total) by mouth every 3 (three) days as needed.  Dispense: 2 tablet; Refill: 0  - Worsening symptoms.  - Will treat empirically with Macrobid  - May use AZO for bladder spasms - Continue to push fluids.  - Diflucan  given as prophylaxis as patient tends to get vaginal yeast infections with antibiotic use - Seek in person evaluation for urine culture if symptoms do not improve or if they worsen.    Follow Up Instructions: I discussed the assessment and treatment plan with the patient. The patient was provided an opportunity to ask questions and all were answered. The patient agreed with the plan and demonstrated an understanding of the instructions.  A copy of instructions were sent to the patient via MyChart unless otherwise noted below.    The patient was advised to call back or seek an in-person evaluation if the symptoms worsen or if the condition fails to improve as anticipated.    Delon HERO Finnis Colee, PA-C     [1] No Known Allergies [2]  Current Outpatient Medications:    fluconazole  (DIFLUCAN ) 150 MG tablet, Take 1 tablet (150 mg total) by mouth every 3 (three) days as needed., Disp: 2 tablet, Rfl: 0   nitrofurantoin , macrocrystal-monohydrate, (MACROBID ) 100 MG capsule, Take 1 capsule (100 mg total) by mouth 2 (two) times daily., Disp: 10 capsule, Rfl: 0   Blood Pressure Monitor DEVI, Please provide patient with insurance approved blood pressure monitor I10.0 (Patient not taking: Reported on 01/31/2024), Disp: 1 each, Rfl: 0   cyclobenzaprine  (FLEXERIL ) 10 MG tablet, Take 1 tablet (10 mg total) by mouth 3 (three) times daily as needed for muscle spasms., Disp: 10 tablet, Rfl: 0   diclofenac  Sodium (VOLTAREN ) 1 % GEL, Apply 4 g topically 4 (four) times daily., Disp: 100 g, Rfl: 1   hydrOXYzine  (ATARAX ) 25 MG tablet, Take  0.5-2 tablets (12.5-50 mg total) by mouth 2 (two) times daily as needed (anxiety or sleep)., Disp: 60 tablet, Rfl: 2   Iron , Ferrous Sulfate , 325 (65 Fe) MG TABS, Take 325 mg by mouth daily., Disp: 90 tablet, Rfl: 1   trimethoprim -polymyxin b  (POLYTRIM ) ophthalmic solution, Place 1 drop into the left eye every 6 (six) hours., Disp: 10 mL, Rfl: 0   valsartan  (DIOVAN ) 160 MG tablet, Take 1 tablet (160 mg total) by mouth daily., Disp: 90 tablet, Rfl: 3  "

## 2024-03-11 NOTE — Patient Instructions (Signed)
 " [name], thank you for joining Delon CHRISTELLA Dickinson, PA-C for today's virtual visit.  While this provider is not your primary care provider (PCP), if your PCP is located in our provider database this encounter information will be shared with them immediately following your visit.   A [facility] MyChart account gives you access to today's visit and all your visits, tests, and labs performed at [facility]  click here if you don't have a [facility] MyChart account or go to [email or web address]  Consent: (Patient) [name] provided verbal consent for this virtual visit at the beginning of the encounter.  Current Medications:  Current Outpatient Medications:    fluconazole  (DIFLUCAN ) 150 MG tablet, Take 1 tablet (150 mg total) by mouth every 3 (three) days as needed., Disp: 2 tablet, Rfl: 0   nitrofurantoin , macrocrystal-monohydrate, (MACROBID ) 100 MG capsule, Take 1 capsule (100 mg total) by mouth 2 (two) times daily., Disp: 10 capsule, Rfl: 0   Blood Pressure Monitor DEVI, Please provide patient with insurance approved blood pressure monitor I10.0 (Patient not taking: Reported on 01/31/2024), Disp: 1 each, Rfl: 0   cyclobenzaprine  (FLEXERIL ) 10 MG tablet, Take 1 tablet (10 mg total) by mouth 3 (three) times daily as needed for muscle spasms., Disp: 10 tablet, Rfl: 0   diclofenac  Sodium (VOLTAREN ) 1 % GEL, Apply 4 g topically 4 (four) times daily., Disp: 100 g, Rfl: 1   hydrOXYzine  (ATARAX ) 25 MG tablet, Take 0.5-2 tablets (12.5-50 mg total) by mouth 2 (two) times daily as needed (anxiety or sleep)., Disp: 60 tablet, Rfl: 2   Iron , Ferrous Sulfate , 325 (65 Fe) MG TABS, Take 325 mg by mouth daily., Disp: 90 tablet, Rfl: 1   trimethoprim -polymyxin b  (POLYTRIM ) ophthalmic solution, Place 1 drop into the left eye every 6 (six) hours., Disp: 10 mL, Rfl: 0   valsartan  (DIOVAN ) 160 MG tablet, Take 1 tablet (160 mg total) by mouth daily., Disp: 90 tablet, Rfl: 3    Medications ordered in this encounter:  Meds ordered this encounter  Medications   nitrofurantoin , macrocrystal-monohydrate, (MACROBID ) 100 MG capsule    Sig: Take 1 capsule (100 mg total) by mouth 2 (two) times daily.    Dispense:  10 capsule    Refill:  0    Supervising Provider:   BLAISE ALEENE KIDD [[id number]]   fluconazole  (DIFLUCAN ) 150 MG tablet    Sig: Take 1 tablet (150 mg total) by mouth every 3 (three) days as needed.    Dispense:  2 tablet    Refill:  0    Supervising Provider:   LAMPTEY, PHILIP O [[id number]]     *If you need refills on other medications prior to your next appointment, please contact your pharmacy*  Follow-Up: Call back or seek an in-person evaluation if the symptoms worsen or if the condition fails to improve as anticipated.  [facility] [phone]  Other Instructions  Urinary Tract Infection, Female A urinary tract infection (UTI) is an infection in your urinary tract. The urinary tract is made up of organs that make, store, and get rid of pee (urine) in your body. These organs include: The kidneys. The ureters. The bladder. The urethra. What are the causes? Most UTIs are caused by germs called bacteria. They may be in or near your genitals. These germs grow and cause swelling in your urinary tract. What increases the risk? You're more likely to get a UTI if: You're a female. The urethra is shorter in females  than in males. You have a soft tube called a catheter that drains your pee. You can't control when you pee or poop. You have trouble peeing because of: A kidney stone. A urinary blockage. A nerve condition that affects your bladder. Not getting enough to drink. You're sexually active. You use a birth control inside your vagina, like spermicide. You're pregnant. You have low levels of the hormone estrogen in your body. You're an older adult. You're also more likely to get a UTI if you have other health problems. These  may include: Diabetes. A weak immune system. Your immune system is your body's defense system. Sickle cell disease. Injury of the spine. What are the signs or symptoms? Symptoms may include: Needing to pee right away. Peeing small amounts often. Pain or burning when you pee. Blood in your pee. Pee that smells bad or odd. Pain in your belly or lower back. You may also: Feel confused. This may be the first symptom in older adults. Vomit. Not feel hungry. Feel tired or easily annoyed. Have a fever or chills. How is this diagnosed? A UTI is diagnosed based on your medical history and an exam. You may also have other tests. These may include: Pee tests. Blood tests. Tests for sexually transmitted infections (STIs). If you've had more than one UTI, you may need to have imaging studies done to find out why you keep getting them. How is this treated? A UTI can be treated by: Taking antibiotics or other medicines. Drinking enough fluid to keep your pee pale yellow. In rare cases, a UTI can cause a very bad condition called sepsis. Sepsis may be treated in the hospital. Follow these instructions at home: Medicines Take your medicines only as told by your health care provider. If you were given antibiotics, take them as told by your provider. Do not stop taking them even if you start to feel better. General instructions Make sure you: Pee often and fully. Do not hold your pee for a long time. Wipe from front to back after you pee or poop. Use each tissue only once when you wipe. Pee after you have sex. Do not douche or use sprays or powders in your genital area. Contact a health care provider if: Your symptoms don't get better after 1-2 days of taking antibiotics. Your symptoms go away and then come back. You have a fever or chills. You vomit or feel like you may vomit. Get help right away if: You have very bad pain in your back or lower belly. You faint. This information is not  intended to replace advice given to you by your health care provider. Make sure you discuss any questions you have with your health care provider. Document Revised: 01/18/2023 Document Reviewed: 05/13/2022 Elsevier Patient Education  [employer].   If you have been instructed to have an in-person evaluation today at a local Urgent Care facility, please use the link below. It will take you to a list of all of our available [facility], including address, phone number and hours of operation. Please do not delay care.  [facility]  If you or a family member do not have a primary care provider, use the link below to schedule a visit and establish care. When you choose a [facility] primary care physician or advanced practice provider, you gain a long-term partner in health. Find a Primary Care Provider  Learn more about [facility]'s in-office and virtual care options: [facility] - Get Care Now "

## 2024-03-14 ENCOUNTER — Encounter: Payer: Self-pay | Admitting: Obstetrics and Gynecology

## 2024-03-14 ENCOUNTER — Other Ambulatory Visit (HOSPITAL_COMMUNITY)
Admission: RE | Admit: 2024-03-14 | Discharge: 2024-03-14 | Disposition: A | Source: Ambulatory Visit | Attending: Obstetrics and Gynecology | Admitting: Obstetrics and Gynecology

## 2024-03-14 ENCOUNTER — Ambulatory Visit: Payer: Self-pay | Admitting: Obstetrics and Gynecology

## 2024-03-14 VITALS — BP 153/99 | HR 81 | Ht 63.0 in | Wt 190.4 lb

## 2024-03-14 DIAGNOSIS — Z01419 Encounter for gynecological examination (general) (routine) without abnormal findings: Secondary | ICD-10-CM

## 2024-03-14 DIAGNOSIS — Z113 Encounter for screening for infections with a predominantly sexual mode of transmission: Secondary | ICD-10-CM

## 2024-03-14 DIAGNOSIS — Z124 Encounter for screening for malignant neoplasm of cervix: Secondary | ICD-10-CM

## 2024-03-14 NOTE — Progress Notes (Signed)
" ° °  WELL-WOMAN PHYSICAL & PAP Patient name: [name] MRN [id number]  Date of birth: 02-07-1986 Chief Complaint:   Annual Exam  History of Present Illness:   [name] is a 39 y.o. [phone] African American female being seen today for a routine well-woman exam.  Current complaints: pap smear  PCP: Theotis Haze ORN, NP      does desire labs Patient's last menstrual period was 02/25/2024 (approximate). The current method of family planning is tubal ligation.  Last pap 11/22/2018. Results were: normal Last mammogram: never had d/t age. Family h/o breast cancer: No Last colonoscopy: never had. Family h/o colorectal cancer: No Review of Systems:   Pertinent items are noted in HPI Denies any headaches, blurred vision, fatigue, shortness of breath, chest pain, abdominal pain, abnormal vaginal discharge/itching/odor/irritation, problems with periods, bowel movements, urination, or intercourse unless otherwise stated above. Pertinent History Reviewed:  Reviewed past medical,surgical, social and family history.  Reviewed problem list, medications and allergies. Physical Assessment:   Vitals:   03/14/24 1500 03/14/24 1507  BP: (!) 157/107 (!) 153/99  Pulse: 82 81  Weight: 190 lb 6.4 oz (86.4 kg)   Height: 5' 3 (1.6 m)   Body mass index is 33.73 kg/m.        Physical Examination:   General appearance - well appearing, and in no distress  Mental status - alert, oriented to person, place, and time  Psych:  She has a normal mood and affect  Skin - warm and dry, normal color, no suspicious lesions noted  Chest - effort normal, all lung fields clear to auscultation bilaterally  Heart - normal rate and regular rhythm  Neck:  midline trachea, no thyromegaly or nodules  Breasts - breasts appear normal, no suspicious masses, no skin or nipple changes or  axillary nodes  Abdomen - soft, nontender, nondistended, no masses or organomegaly  Pelvic - VULVA: normal appearing vulva with  no masses, tenderness or lesions  VAGINA: normal appearing vagina with normal color and discharge, no lesions  CERVIX: normal appearing cervix without discharge or lesions, no CMT  Thin prep pap is done with reflex HR HPV cotesting  UTERUS: uterus is felt to be normal size, shape, consistency and nontender   ADNEXA: No adnexal masses or tenderness noted.  Rectal - deferred  Extremities:  No swelling or varicosities noted  No results found for this or any previous visit (from the past 24 hours).  Assessment & Plan:  1. Encounter for well woman exam with routine gynecological exam (Primary) - Cytology - PAP( [place]) - Anticipatory guidance given that follow-up plan for pap results will be communicated via MyChart  2. Pap smear for cervical cancer screening  3. Screening examination for STI - Cervicovaginal ancillary only( [place]) - HIV antibody (with reflex) - Hepatitis B Surface AntiGEN - Hepatitis C Antibody - RPR   Labs/procedures today: pap & STI screening  Mammogram at age 90 or sooner if problems Colonoscopy at age 80 or sooner if problems  Orders Placed This Encounter  Procedures   HIV antibody (with reflex)   Hepatitis B Surface AntiGEN   Hepatitis C Antibody   RPR    Meds: No orders of the defined types were placed in this encounter.   Follow-up: Return in about 1 year (around 03/14/2025) for Annual Exam.  Ala Cart MSN, CNM 03/14/2024 3:36 PM "

## 2024-03-15 LAB — SYPHILIS: RPR W/REFLEX TO RPR TITER AND TREPONEMAL ANTIBODIES, TRADITIONAL SCREENING AND DIAGNOSIS ALGORITHM: RPR Ser Ql: NONREACTIVE

## 2024-03-15 LAB — HIV ANTIBODY (ROUTINE TESTING W REFLEX): HIV Screen 4th Generation wRfx: NONREACTIVE

## 2024-03-15 LAB — CYTOLOGY - PAP
Adequacy: ABSENT
Comment: NEGATIVE
Diagnosis: NEGATIVE
High risk HPV: NEGATIVE

## 2024-03-15 LAB — CERVICOVAGINAL ANCILLARY ONLY
Chlamydia: NEGATIVE
Comment: NEGATIVE
Comment: NEGATIVE
Comment: NORMAL
Neisseria Gonorrhea: NEGATIVE
Trichomonas: NEGATIVE

## 2024-03-15 LAB — HEPATITIS B SURFACE ANTIGEN: Hepatitis B Surface Ag: NEGATIVE

## 2024-03-15 LAB — HEPATITIS C ANTIBODY: Hep C Virus Ab: NONREACTIVE

## 2024-03-23 ENCOUNTER — Ambulatory Visit: Payer: Self-pay | Admitting: Obstetrics and Gynecology

## 2024-04-30 ENCOUNTER — Ambulatory Visit: Admitting: Nurse Practitioner
